# Patient Record
Sex: Female | Born: 1940 | Race: White | Hispanic: No | Smoking: Never smoker
Health system: Southern US, Community
[De-identification: ages and names within clinical notes are randomized; demographics above are authoritative.]

## PROBLEM LIST (undated history)

## (undated) DIAGNOSIS — R42 Dizziness and giddiness: Secondary | ICD-10-CM

## (undated) DIAGNOSIS — M8580 Other specified disorders of bone density and structure, unspecified site: Secondary | ICD-10-CM

## (undated) DIAGNOSIS — J189 Pneumonia, unspecified organism: Secondary | ICD-10-CM

## (undated) DIAGNOSIS — E785 Hyperlipidemia, unspecified: Secondary | ICD-10-CM

## (undated) DIAGNOSIS — E039 Hypothyroidism, unspecified: Secondary | ICD-10-CM

## (undated) DIAGNOSIS — I10 Essential (primary) hypertension: Secondary | ICD-10-CM

## (undated) DIAGNOSIS — R7303 Prediabetes: Secondary | ICD-10-CM

## (undated) DIAGNOSIS — E0590 Thyrotoxicosis, unspecified without thyrotoxic crisis or storm: Secondary | ICD-10-CM

## (undated) DIAGNOSIS — Z923 Personal history of irradiation: Secondary | ICD-10-CM

## (undated) DIAGNOSIS — E119 Type 2 diabetes mellitus without complications: Secondary | ICD-10-CM

## (undated) DIAGNOSIS — K635 Polyp of colon: Secondary | ICD-10-CM

## (undated) DIAGNOSIS — C50911 Malignant neoplasm of unspecified site of right female breast: Secondary | ICD-10-CM

## (undated) DIAGNOSIS — G4700 Insomnia, unspecified: Secondary | ICD-10-CM

## (undated) HISTORY — PX: MASTECTOMY: SHX3

## (undated) HISTORY — PX: APPENDECTOMY: SHX54

## (undated) HISTORY — PX: OOPHORECTOMY: SHX86

## (undated) HISTORY — PX: EYE SURGERY: SHX253

## (undated) HISTORY — PX: CHOLECYSTECTOMY: SHX55

## (undated) HISTORY — PX: CATARACT EXTRACTION W/ INTRAOCULAR LENS IMPLANT: SHX1309

## (undated) HISTORY — PX: THYROID SURGERY: SHX805

## (undated) HISTORY — PX: TOTAL THYROIDECTOMY: SHX2547

## (undated) HISTORY — PX: TONSILLECTOMY: SUR1361

## (undated) HISTORY — PX: BREAST BIOPSY: SHX20

## (undated) HISTORY — PX: ABDOMINAL HYSTERECTOMY: SHX81

## (undated) HISTORY — PX: COLONOSCOPY: SHX174

---

## 2004-07-27 ENCOUNTER — Ambulatory Visit: Payer: Self-pay | Admitting: General Surgery

## 2005-08-01 ENCOUNTER — Ambulatory Visit: Payer: Self-pay | Admitting: General Surgery

## 2006-02-13 ENCOUNTER — Ambulatory Visit: Payer: Self-pay | Admitting: Internal Medicine

## 2006-03-13 ENCOUNTER — Ambulatory Visit: Payer: Self-pay | Admitting: Unknown Physician Specialty

## 2006-08-13 ENCOUNTER — Ambulatory Visit: Payer: Self-pay | Admitting: General Surgery

## 2006-09-12 ENCOUNTER — Ambulatory Visit: Payer: Self-pay | Admitting: Unknown Physician Specialty

## 2007-08-19 ENCOUNTER — Ambulatory Visit: Payer: Self-pay | Admitting: Internal Medicine

## 2007-09-16 ENCOUNTER — Ambulatory Visit: Payer: Self-pay | Admitting: Unknown Physician Specialty

## 2008-03-09 ENCOUNTER — Ambulatory Visit: Payer: Self-pay | Admitting: Unknown Physician Specialty

## 2008-08-24 ENCOUNTER — Ambulatory Visit: Payer: Self-pay | Admitting: Internal Medicine

## 2008-09-28 ENCOUNTER — Ambulatory Visit: Payer: Self-pay | Admitting: Unknown Physician Specialty

## 2009-03-29 ENCOUNTER — Ambulatory Visit: Payer: Self-pay | Admitting: Unknown Physician Specialty

## 2009-08-25 ENCOUNTER — Ambulatory Visit: Payer: Self-pay | Admitting: Internal Medicine

## 2009-09-13 ENCOUNTER — Ambulatory Visit: Payer: Self-pay | Admitting: Unknown Physician Specialty

## 2009-09-22 ENCOUNTER — Ambulatory Visit: Payer: Self-pay | Admitting: Unknown Physician Specialty

## 2009-09-28 ENCOUNTER — Ambulatory Visit: Payer: Self-pay | Admitting: Cardiovascular Disease

## 2009-10-11 ENCOUNTER — Ambulatory Visit: Payer: Self-pay | Admitting: Unknown Physician Specialty

## 2010-09-19 ENCOUNTER — Ambulatory Visit: Payer: Self-pay | Admitting: Internal Medicine

## 2011-09-20 ENCOUNTER — Ambulatory Visit: Payer: Self-pay | Admitting: Family Medicine

## 2011-11-29 ENCOUNTER — Ambulatory Visit: Payer: Self-pay | Admitting: Family Medicine

## 2011-12-04 ENCOUNTER — Ambulatory Visit: Payer: Self-pay | Admitting: Family Medicine

## 2011-12-04 LAB — CREATININE, SERUM
Creatinine: 0.8 mg/dL (ref 0.60–1.30)
EGFR (African American): >60
EGFR (Non-African Amer.): >60

## 2012-09-30 ENCOUNTER — Ambulatory Visit: Payer: Self-pay | Admitting: Family Medicine

## 2012-12-17 ENCOUNTER — Ambulatory Visit: Payer: Self-pay | Admitting: Gastroenterology

## 2013-10-13 ENCOUNTER — Ambulatory Visit: Payer: Self-pay | Admitting: Family Medicine

## 2014-11-04 ENCOUNTER — Other Ambulatory Visit: Payer: Self-pay | Admitting: Family Medicine

## 2014-11-04 DIAGNOSIS — Z1231 Encounter for screening mammogram for malignant neoplasm of breast: Secondary | ICD-10-CM

## 2014-11-09 ENCOUNTER — Other Ambulatory Visit: Payer: Self-pay | Admitting: Family Medicine

## 2014-11-09 ENCOUNTER — Ambulatory Visit
Admission: RE | Admit: 2014-11-09 | Discharge: 2014-11-09 | Disposition: A | Discharge status: Home / Self Care | Payer: Medicare Other | Source: Ambulatory Visit | Attending: Family Medicine | Admitting: Family Medicine

## 2014-11-09 DIAGNOSIS — Z1231 Encounter for screening mammogram for malignant neoplasm of breast: Secondary | ICD-10-CM

## 2014-11-11 ENCOUNTER — Ambulatory Visit: Payer: Self-pay

## 2015-09-30 ENCOUNTER — Other Ambulatory Visit: Payer: Self-pay | Admitting: Family Medicine

## 2015-09-30 DIAGNOSIS — Z1231 Encounter for screening mammogram for malignant neoplasm of breast: Secondary | ICD-10-CM

## 2015-11-14 ENCOUNTER — Ambulatory Visit
Admission: RE | Admit: 2015-11-14 | Discharge: 2015-11-14 | Disposition: A | Discharge status: Home / Self Care | Payer: Medicare Other | Source: Ambulatory Visit | Attending: Family Medicine | Admitting: Family Medicine

## 2015-11-14 ENCOUNTER — Other Ambulatory Visit: Payer: Self-pay | Admitting: Family Medicine

## 2015-11-14 DIAGNOSIS — Z1231 Encounter for screening mammogram for malignant neoplasm of breast: Secondary | ICD-10-CM | POA: Insufficient documentation

## 2016-04-09 DIAGNOSIS — Z923 Personal history of irradiation: Secondary | ICD-10-CM

## 2016-04-09 HISTORY — DX: Personal history of irradiation: Z92.3

## 2016-10-03 ENCOUNTER — Other Ambulatory Visit: Payer: Self-pay | Admitting: Family Medicine

## 2016-10-03 DIAGNOSIS — Z1231 Encounter for screening mammogram for malignant neoplasm of breast: Secondary | ICD-10-CM

## 2016-11-07 DIAGNOSIS — C50911 Malignant neoplasm of unspecified site of right female breast: Secondary | ICD-10-CM

## 2016-11-07 HISTORY — PX: BREAST BIOPSY: SHX20

## 2016-11-07 HISTORY — DX: Malignant neoplasm of unspecified site of right female breast: C50.911

## 2016-11-20 ENCOUNTER — Ambulatory Visit
Admission: RE | Admit: 2016-11-20 | Discharge: 2016-11-20 | Disposition: A | Discharge status: Home / Self Care | Payer: Medicare Other | Source: Ambulatory Visit | Attending: Family Medicine | Admitting: Family Medicine

## 2016-11-20 DIAGNOSIS — R928 Other abnormal and inconclusive findings on diagnostic imaging of breast: Secondary | ICD-10-CM | POA: Diagnosis not present

## 2016-11-20 DIAGNOSIS — Z1231 Encounter for screening mammogram for malignant neoplasm of breast: Secondary | ICD-10-CM | POA: Diagnosis present

## 2016-11-21 ENCOUNTER — Other Ambulatory Visit: Payer: Self-pay | Admitting: Family Medicine

## 2016-11-21 DIAGNOSIS — R928 Other abnormal and inconclusive findings on diagnostic imaging of breast: Secondary | ICD-10-CM

## 2016-11-21 DIAGNOSIS — N6310 Unspecified lump in the right breast, unspecified quadrant: Secondary | ICD-10-CM

## 2016-11-27 ENCOUNTER — Ambulatory Visit
Admission: RE | Admit: 2016-11-27 | Discharge: 2016-11-27 | Disposition: A | Discharge status: Home / Self Care | Payer: Medicare Other | Source: Ambulatory Visit | Attending: Family Medicine | Admitting: Family Medicine

## 2016-11-27 DIAGNOSIS — R921 Mammographic calcification found on diagnostic imaging of breast: Secondary | ICD-10-CM | POA: Insufficient documentation

## 2016-11-27 DIAGNOSIS — R928 Other abnormal and inconclusive findings on diagnostic imaging of breast: Secondary | ICD-10-CM

## 2016-11-27 DIAGNOSIS — N6310 Unspecified lump in the right breast, unspecified quadrant: Secondary | ICD-10-CM

## 2016-11-27 DIAGNOSIS — N6312 Unspecified lump in the right breast, upper inner quadrant: Secondary | ICD-10-CM | POA: Diagnosis not present

## 2016-11-29 ENCOUNTER — Other Ambulatory Visit: Payer: Self-pay | Admitting: Family Medicine

## 2016-11-29 DIAGNOSIS — R928 Other abnormal and inconclusive findings on diagnostic imaging of breast: Secondary | ICD-10-CM

## 2016-11-29 DIAGNOSIS — N6310 Unspecified lump in the right breast, unspecified quadrant: Secondary | ICD-10-CM

## 2016-11-29 DIAGNOSIS — R921 Mammographic calcification found on diagnostic imaging of breast: Secondary | ICD-10-CM

## 2016-12-04 ENCOUNTER — Ambulatory Visit
Admission: RE | Admit: 2016-12-04 | Discharge: 2016-12-04 | Disposition: A | Discharge status: Home / Self Care | Payer: Medicare Other | Source: Ambulatory Visit | Attending: Family Medicine | Admitting: Family Medicine

## 2016-12-04 ENCOUNTER — Other Ambulatory Visit: Payer: Medicare Other

## 2016-12-04 DIAGNOSIS — N6310 Unspecified lump in the right breast, unspecified quadrant: Secondary | ICD-10-CM

## 2016-12-04 DIAGNOSIS — R928 Other abnormal and inconclusive findings on diagnostic imaging of breast: Secondary | ICD-10-CM

## 2016-12-04 DIAGNOSIS — R921 Mammographic calcification found on diagnostic imaging of breast: Secondary | ICD-10-CM | POA: Insufficient documentation

## 2016-12-05 ENCOUNTER — Other Ambulatory Visit: Payer: Self-pay | Admitting: Pathology

## 2016-12-08 HISTORY — PX: BREAST LUMPECTOMY: SHX2

## 2016-12-12 ENCOUNTER — Other Ambulatory Visit: Payer: Self-pay | Admitting: Surgery

## 2016-12-12 DIAGNOSIS — C50211 Malignant neoplasm of upper-inner quadrant of right female breast: Secondary | ICD-10-CM

## 2016-12-13 ENCOUNTER — Other Ambulatory Visit: Payer: Self-pay

## 2016-12-14 ENCOUNTER — Other Ambulatory Visit: Payer: Self-pay | Admitting: Surgery

## 2016-12-14 ENCOUNTER — Encounter: Payer: Self-pay | Admitting: *Deleted

## 2016-12-14 DIAGNOSIS — C50211 Malignant neoplasm of upper-inner quadrant of right female breast: Secondary | ICD-10-CM

## 2016-12-14 NOTE — Progress Notes (Signed)
  Oncology Nurse Navigator Documentation  Navigator Location: [facility] (12/14/16 1500) Referral date to RadOnc/MedOnc: 12/21/16 (12/14/16 1500) )Navigator Encounter Type: Introductory phone call (12/14/16 1500)   Abnormal Finding Date: 11/27/16 (12/14/16 1500) Confirmed Diagnosis Date: 12/07/16 (12/14/16 1500) Surgery Date: 01/04/17 (12/14/16 1500)                 Barriers/Navigation Needs: Education (12/14/16 1500) Education: Newly Diagnosed Cancer Education (12/14/16 1500)                        Time Spent with Patient: 66 (12/14/16 1500)   Called and talked with patient today.  She had her initial surgical consult yesterday with Dr. Tamala Julian.  She picked up our educational literature at the that visit.  She is scheduled for medical oncology consult with Dr. Grayland Ormond on 9/14 and surgery on 9/28.  She is to call if she has any questions or needs.

## 2016-12-21 ENCOUNTER — Ambulatory Visit: Payer: Medicare Other | Admitting: Oncology

## 2016-12-25 ENCOUNTER — Encounter
Admission: RE | Admit: 2016-12-25 | Discharge: 2016-12-25 | Disposition: A | Discharge status: Home / Self Care | Payer: Medicare Other | Source: Ambulatory Visit | Attending: Surgery | Admitting: Surgery

## 2016-12-25 DIAGNOSIS — C50211 Malignant neoplasm of upper-inner quadrant of right female breast: Secondary | ICD-10-CM | POA: Insufficient documentation

## 2016-12-25 DIAGNOSIS — I10 Essential (primary) hypertension: Secondary | ICD-10-CM | POA: Insufficient documentation

## 2016-12-25 DIAGNOSIS — Z01818 Encounter for other preprocedural examination: Secondary | ICD-10-CM | POA: Insufficient documentation

## 2016-12-25 HISTORY — DX: Prediabetes: R73.03

## 2016-12-25 HISTORY — DX: Hypothyroidism, unspecified: E03.9

## 2016-12-25 HISTORY — DX: Insomnia, unspecified: G47.00

## 2016-12-25 HISTORY — DX: Other specified disorders of bone density and structure, unspecified site: M85.80

## 2016-12-25 HISTORY — DX: Essential (primary) hypertension: I10

## 2016-12-25 NOTE — Progress Notes (Deleted)
[place]  Telephone:([phone]) [phone] Fax:(336) [phone]  ID: Meghan Dougherty OB: [birth year]  MR#: [id number]  JKK#:[id number]  Patient Care Team: Dion Body, MD as PCP - General (Family Medicine)  CHIEF COMPLAINT: ***  INTERVAL HISTORY: ***  REVIEW OF SYSTEMS:   ROS  As per HPI. Otherwise, a complete review of systems is negative.  PAST MEDICAL HISTORY: Past Medical History:  Diagnosis Date  . Borderline diabetes   . Hyperlipidemia   . Hypertension   . Hypothyroidism   . Insomnia   . Osteopenia     PAST SURGICAL HISTORY: Past Surgical History:  Procedure Laterality Date  . ABDOMINAL HYSTERECTOMY     oophorectomy  . BREAST BIOPSY Left    neg core  . BREAST BIOPSY Right    neg  core  . COLONOSCOPY    . EYE SURGERY     bilateral cataracts  . TONSILLECTOMY    . TOTAL THYROIDECTOMY      FAMILY HISTORY: Family History  Problem Relation Age of Onset  . Breast cancer Sister 37  . Breast cancer Maternal Aunt     ADVANCED DIRECTIVES (Y/N):  N  HEALTH MAINTENANCE: Social History  Substance Use Topics  . Smoking status: Never Smoker  . Smokeless tobacco: Never Used  . Alcohol use Yes     Comment: occassional     Colonoscopy:  PAP:  Bone density:  Lipid panel:  Allergies  Allergen Reactions  . Trazodone Other (See Comments)    Headache and nightmares  . Penicillins Rash    Has patient had a PCN reaction causing immediate rash, facial/tongue/throat swelling, SOB or lightheadedness with hypotension: No Has patient had a PCN reaction causing severe rash involving mucus membranes or skin necrosis: No Has patient had a PCN reaction that required hospitalization: No Has patient had a PCN reaction occurring within the last 10 years: No If all of the above answers are "NO", then may proceed with Cephalosporin use.     Current Outpatient Prescriptions  Medication Sig Dispense Refill  . acetaminophen (TYLENOL) 500 MG tablet  Take 250-500 mg by mouth every 6 (six) hours as needed for mild pain.    Marland Kitchen amLODipine (NORVASC) 5 MG tablet Take 5 mg by mouth every evening.    Marland Kitchen aspirin EC 81 MG tablet Take 81 mg by mouth daily.    . calcium-vitamin D (OSCAL WITH D) 500-200 MG-UNIT tablet Take 2 tablets by mouth daily.    . fluticasone (FLONASE) 50 MCG/ACT nasal spray Place 1 spray into both nostrils daily as needed for allergies or rhinitis.    . hydrochlorothiazide (HYDRODIURIL) 25 MG tablet Take 25 mg by mouth daily.    Marland Kitchen levothyroxine (SYNTHROID, LEVOTHROID) 50 MCG tablet Take 50 mcg by mouth daily before breakfast.    . lisinopril (PRINIVIL,ZESTRIL) 40 MG tablet Take 40 mg by mouth every evening.     . metoprolol tartrate (LOPRESSOR) 100 MG tablet Take 100 mg by mouth 2 (two) times daily.    . Multiple Vitamin (MULTIVITAMIN WITH MINERALS) TABS tablet Take 2 tablets by mouth daily.    . Omega-3 Fatty Acids (FISH OIL PO) Take 1 capsule by mouth daily.    Marland Kitchen omeprazole (PRILOSEC) 20 MG capsule Take 20 mg by mouth daily as needed (heartburn).    Marland Kitchen PARoxetine (PAXIL) 10 MG tablet Take 10 mg by mouth at bedtime.    . pravastatin (PRAVACHOL) 10 MG tablet Take 10 mg by mouth every evening.    Marland Kitchen  Probiotic Product (PROBIOTIC DAILY PO) Take 1 capsule by mouth daily.    Marland Kitchen zolpidem (AMBIEN) 5 MG tablet Take 2.5 mg by mouth at bedtime as needed for sleep.     No current facility-administered medications for this visit.     OBJECTIVE: There were no vitals filed for this visit.   There is no height or weight on file to calculate BMI.    ECOG FS:{CHL ONC [id number]  General: Well-developed, well-nourished, no acute distress. Eyes: Pink conjunctiva, anicteric sclera. HEENT: Normocephalic, moist mucous membranes, clear oropharnyx. Lungs: Clear to auscultation bilaterally. Heart: Regular rate and rhythm. No rubs, murmurs, or gallops. Abdomen: Soft, nontender, nondistended. No organomegaly noted, normoactive bowel  sounds. Musculoskeletal: No edema, cyanosis, or clubbing. Neuro: Alert, answering all questions appropriately. Cranial nerves grossly intact. Skin: No rashes or petechiae noted. Psych: Normal affect. Lymphatics: No cervical, calvicular, axillary or inguinal LAD.   LAB RESULTS:  Lab Results  Component Value Date   CREATININE 0.80 12/04/2011   GFRNONAA >60 12/04/2011   GFRAA >60 12/04/2011    No results found for: WBC, NEUTROABS, HGB, HCT, MCV, PLT   STUDIES: US Breast Ltd Uni Right Inc Axilla  Result Date: 11/27/2016 CLINICAL DATA:  Screening recall for a right breast mass.The patient has family history of breast cancer in her sister at age 60 and in a maternal aunt EXAM: 2D DIGITAL DIAGNOSTIC UNILATERAL RIGHT MAMMOGRAM WITH CAD AND ADJUNCT TOMO RIGHT BREAST ULTRASOUND COMPARISON:  Previous exam(s). ACR Breast Density Category c: The breast tissue is heterogeneously dense, which may obscure small masses. FINDINGS: In the upper slightly inner quadrant of the right breast there is an oval mass with an angular as well is indistinct margins measuring approximately 4 mm. This is in the far posterior depth of the breast. Additionally, in the lower-inner quadrant of the right breast there is a 1.8 cm group of amorphous calcifications in the posterior depth. On the spot compression tomosynthesis images in the CC view, these calcifications appear to be in a linear branching configuration. Mammographic images were processed with CAD. Ultrasound targeted to the right breast at 1230, 10 cm from the nipple demonstrates an irregular hypoechoic mass measuring 6 x 4 x 5 mm. Ultrasound of the right axilla demonstrates multiple normal-appearing lymph nodes. IMPRESSION: 1.  There is a suspicious mass in the right breast at 12:30. 2. There is an indeterminate 1.8 cm group of amorphous calcifications in the lower-inner quadrant of the right breast. 3.  No evidence of right axillary lymphadenopathy. RECOMMENDATION:  1. Ultrasound-guided biopsy is recommended for the small right breast mass at 1230. 2. Stereotactic biopsy is recommended for the calcifications in the lower-inner quadrant of the right breast. I have discussed the findings and recommendations with the patient. Results were also provided in writing at the conclusion of the visit. If applicable, a reminder letter will be sent to the patient regarding the next appointment. BI-RADS CATEGORY  4: Suspicious. Electronically Signed   By: Ammie Ferrier M.D.   On: 11/27/2016 12:00   Mm Diag Breast Tomo Uni Right  Result Date: 11/27/2016 CLINICAL DATA:  Screening recall for a right breast mass.The patient has family history of breast cancer in her sister at age 82 and in a maternal aunt EXAM: 2D DIGITAL DIAGNOSTIC UNILATERAL RIGHT MAMMOGRAM WITH CAD AND ADJUNCT TOMO RIGHT BREAST ULTRASOUND COMPARISON:  Previous exam(s). ACR Breast Density Category c: The breast tissue is heterogeneously dense, which may obscure small masses. FINDINGS: In the upper slightly inner quadrant  of the right breast there is an oval mass with an angular as well is indistinct margins measuring approximately 4 mm. This is in the far posterior depth of the breast. Additionally, in the lower-inner quadrant of the right breast there is a 1.8 cm group of amorphous calcifications in the posterior depth. On the spot compression tomosynthesis images in the CC view, these calcifications appear to be in a linear branching configuration. Mammographic images were processed with CAD. Ultrasound targeted to the right breast at 1230, 10 cm from the nipple demonstrates an irregular hypoechoic mass measuring 6 x 4 x 5 mm. Ultrasound of the right axilla demonstrates multiple normal-appearing lymph nodes. IMPRESSION: 1.  There is a suspicious mass in the right breast at 12:30. 2. There is an indeterminate 1.8 cm group of amorphous calcifications in the lower-inner quadrant of the right breast. 3.  No evidence of  right axillary lymphadenopathy. RECOMMENDATION: 1. Ultrasound-guided biopsy is recommended for the small right breast mass at 1230. 2. Stereotactic biopsy is recommended for the calcifications in the lower-inner quadrant of the right breast. I have discussed the findings and recommendations with the patient. Results were also provided in writing at the conclusion of the visit. If applicable, a reminder letter will be sent to the patient regarding the next appointment. BI-RADS CATEGORY  4: Suspicious. Electronically Signed   By: Ammie Ferrier M.D.   On: 11/27/2016 12:00   Mm Clip Placement Right  Result Date: 12/04/2016 CLINICAL DATA:  Status post ultrasound-guided biopsy of a right breast mass at the 12:30 o'clock axis and stereotactic biopsy of indeterminate calcifications within the lower inner quadrant of the right breast. EXAM: DIAGNOSTIC RIGHT MAMMOGRAM POST ULTRASOUND AND STEREOTACTIC BIOPSY COMPARISON:  Previous exam(s). FINDINGS: Mammographic images were obtained following ultrasound guided biopsy of a right breast mass at the 12:30 o'clock axis followed by a stereotactic guided biopsy of indeterminate calcifications in the lower inner quadrant of the right breast. Ribbon shaped clip appears appropriately position within the upper right breast corresponding to the targeted mass. Coil shaped biopsy clip is well-positioned within the lower inner quadrant of the right breast corresponding to the targeted calcifications. IMPRESSION: 1. Ribbon shaped biopsy clip appropriately position within the upper right breast at the site of the targeted mass at the 12:30 o'clock axis. 2. Coil shaped biopsy clip well-positioned within the lower inner quadrant of the right breast corresponding to the targeted calcifications. Final Assessment: Post Procedure Mammograms for Marker Placement Electronically Signed   By: Franki Cabot M.D.   On: 12/04/2016 10:02   Mm Rt Breast Bx W Loc Dev 1st Lesion Image Bx Spec Stereo  Guide  Addendum Date: 12/07/2016   ADDENDUM REPORT: 12/06/2016 17:01 ADDENDUM: Pathology of the stereotactic guided right breast biopsy revealed B. RIGHT BREAST, LOWER INNER QUADRANT; STEREOTACTIC CORE BIOPSY: FIBROADENOMATOUS CHANGE WITH MICROCALCIFICATIONS. This was found to be concordant by Dr. Enriqueta Shutter. Recommendation: Surgical and oncology consultation for additional area of the right breast biopsied with ultrasound guidance same day. Results and recommendations were relayed to Dr. Raylene Miyamoto nurse Wells Guiles, RN on 12/05/16. She stated that Dr. Netty Starring is out of the office but one of the other providers will contact the patient with the results. They will make the referrals. The patient was contacted by Jetta Lout, [facility]) on 12/05/16 for a post biopsy check. The patient stated she has had no bleeding or bruising. She did state she had itching and burning under the clear bandage. She was instructed to remove the  bandage and try to leave the steri-strips in place if possible. Post biopsy instructions were reviewed with the patient. All of her questions were answered. She was encouraged to contact Jetta Lout, [place] with any further questions or concerns regarding the biopsy site. Addendum by Jetta Lout, RRA on 12/06/16. Electronically Signed   By: Franki Cabot M.D.   On: 12/06/2016 17:01   Result Date: 12/07/2016 CLINICAL DATA:  Patient with indeterminate calcifications within the lower inner quadrant of the right breast presents today for stereotactic biopsy. EXAM: RIGHT BREAST STEREOTACTIC CORE NEEDLE BIOPSY COMPARISON:  Previous exams. FINDINGS: The patient and I discussed the procedure of stereotactic-guided biopsy including benefits and alternatives. We discussed the high likelihood of a successful procedure. We discussed the risks of the procedure including infection, bleeding, tissue injury, clip migration, and inadequate sampling. Informed written consent was given. The usual  time out protocol was performed immediately prior to the procedure. Using sterile technique and 1% Lidocaine as local anesthetic, under stereotactic guidance, a 9 gauge vacuum assisted device was used to perform core needle biopsy of calcifications in the lower inner quadrant of the right breast using a medial approach. Specimen radiograph was performed showing calcifications. Specimens with calcifications are identified for pathology. Lesion quadrant: Lower inner quadrant At the conclusion of the procedure, a coil shaped tissue marker clip was deployed into the biopsy cavity. Follow-up 2-view mammogram was performed and dictated separately. IMPRESSION: Stereotactic-guided biopsy of calcifications within the lower inner quadrant of the right breast. No apparent complications. Electronically Signed: By: Franki Cabot M.D. On: 12/04/2016 09:50   [place] Rt Breast Bx W Loc Dev 1st Lesion Img Bx Spec US Guide  Addendum Date: 12/07/2016   ADDENDUM REPORT: 12/06/2016 16:51 ADDENDUM: Pathology of the ultrasound guided right breast biopsy revealed A. RIGHT BREAST, 12:30; ULTRASOUND-GUIDED CORE BIOPSY: INVASIVE MAMMARY CARCINOMA, NO SPECIAL TYPE. Size of invasive carcinoma: 0.6 cm in this sample Histologic grade of invasive carcinoma: Grade 3. ER/PR/HER2: Immunohistochemistry will be performed with reflex to [place] for equivocal HER2-neu (2+) results. The results will be reported in an addendum. This was found to be concordant by Dr. Enriqueta Shutter. Recommendation:  Surgical and oncology referrals. Results were relayed to Dr. Raylene Miyamoto nurse Wells Guiles, RN on 12/05/16. She stated that Dr. Netty Starring is out of the office and one of the other providers will contact the patient with the results. They will arrange the referrals. The patient was contacted by Jetta Lout, [place] by phone on 12/05/16 for a post biopsy site check. The patient stated she had no bleeding or bruising. She did describe a sensitivity to the bandage with itching and  burning under the bandage. She was instructed to remove the clear bandage and leave the steri-strips in place if possible. Post biopsy instructions were reviewed with the patient. All of her questions were answered. She was encouraged to contact Jetta Lout, [place] with any further questions or concerns regarding the biopsy site. Addendum by Jetta Lout, RRA on 12/06/16. Electronically Signed   By: Franki Cabot M.D.   On: 12/06/2016 16:51   Result Date: 12/07/2016 CLINICAL DATA:  Patient with a suspicious right breast mass at the 12:30 o'clock axis presents today for ultrasound-guided biopsy. EXAM: ULTRASOUND GUIDED RIGHT BREAST CORE NEEDLE BIOPSY COMPARISON:  Previous exam(s). PROCEDURE: I met with the patient and we discussed the procedure of ultrasound-guided biopsy, including benefits and alternatives. We discussed the high likelihood of a successful procedure. We discussed the risks of the procedure including infection, bleeding, tissue injury,  clip migration, and inadequate sampling. Informed written consent was given. The usual time-out protocol was performed immediately prior to the procedure. Lesion quadrant: Upper inner quadrant Using sterile technique and 1% Lidocaine as local anesthetic, under direct ultrasound visualization, a 12 gauge spring-loaded device was used to perform biopsy of the right breast mass at the 12:30 o'clock axis, 10 cm from the nipple,using a medial approach. At the conclusion of the procedure, a ribbon shaped tissue marker clip was deployed into the biopsy cavity. Follow-up 2-view mammogram was performed and dictated separately. IMPRESSION: Ultrasound-guided biopsy of the right breast mass at the 12:30 o'clock axis. No apparent complications. Electronically Signed: By: Franki Cabot M.D. On: 12/04/2016 09:04    ASSESSMENT: Clinical stage upper inner quadrant of the the right breast.  PLAN:    Patient expressed understanding and was in agreement with this plan. She also  understands that She can call clinic at any time with any questions, concerns, or complaints.   Cancer Staging Primary cancer of upper inner quadrant of right female breast [facility]) Staging form: Breast, AJCC 8th Edition - Clinical: cT1b, cN0, cM0, G3 - Signed by Lloyd Huger, MD on 12/25/2016   Lloyd Huger, MD   12/25/2016 10:29 PM

## 2016-12-25 NOTE — Patient Instructions (Signed)
  Your procedure is scheduled on: Friday 01/04/18 Report to Mammography. Nuclear medicine 8:45 .  Remember: Instructions that are not followed completely may result in serious medical risk, up to and including death, or upon the discretion of your surgeon and anesthesiologist your surgery may need to be rescheduled.    _x___ 1. Do not eat food after midnight the night before your procedure. No gum chewing or hard candies. You may drink clear liquids up to 2 hours before you are scheduled to arrive for your surgery- DO not drink clear liquids within 2 hours of the start of your surgery.  Clear Liquids include: water, apple juice without pulp, clear carbohydrate drink such as Clearfast of Gartorade, Black Coffee or Tea (Do not add anything to coffee or tea).    __x__ 2. No Alcohol for 24 hours before or after surgery.   ____ 3. Do Not Smoke For 24 Hours Prior to Your Surgery.   ____ 4. Bring all medications with you on the day of surgery if instructed.    __x__ 5. Notify your doctor if there is any change in your medical condition     (cold, fever, infections).       Do not wear jewelry, make-up, hairpins, clips or nail polish.  Do not wear lotions, powders, or perfumes. You may wear deodorant.  Do not shave 48 hours prior to surgery. Men may shave face and neck.  Do not bring valuables to the hospital.    [facility] is not responsible for any belongings or valuables.               Contacts, dentures or bridgework may not be worn into surgery.  Leave your suitcase in the car. After surgery it may be brought to your room.  For patients admitted to the hospital, discharge time is determined by your                treatment team.   Patients discharged the day of surgery will not be allowed to drive home.   Please read over the following fact sheets that you were given:      _x___ Take these medicines the morning of surgery with A SIP OF WATER:    1. acetaminophen  (TYLENOL) 500 MG tablet  2. levothyroxine (SYNTHROID, LEVOTHROID) 50 MCG tablet  3. metoprolol tartrate (LOPRESSOR) 100 MG tablet  4.omeprazole (PRILOSEC) 20 MG capsule the night before the surgery and the morning of surgery  5.  6.  ____ Fleet Enema (as directed)   _x___ Use CHG Soap or regular soap the night before surgery and morning of surgery  ____ Use inhalers on the day of surgery  ____ Stop metformin 2 days prior to surgery    ____ Take 1/2 of usual insulin dose the night before surgery and none on the morning of surgery.   __x__ Stop aspirin on 12/26/16 as directed by Dr. Thompson Caul office  _x___ Stop Anti-inflammatories  ( Aleve Ibuprofen ) on 9/21 Tylenol OK   __x__ Stop supplements until after surgery.  On 12/26/16 as directed by Dr. Thompson Caul office  ____ Bring C-Pap to the hospital.

## 2016-12-26 ENCOUNTER — Encounter
Admission: RE | Admit: 2016-12-26 | Discharge: 2016-12-26 | Disposition: A | Discharge status: Home / Self Care | Payer: Medicare Other | Source: Ambulatory Visit | Attending: Surgery | Admitting: Surgery

## 2016-12-26 DIAGNOSIS — I10 Essential (primary) hypertension: Secondary | ICD-10-CM | POA: Diagnosis not present

## 2016-12-26 DIAGNOSIS — Z01818 Encounter for other preprocedural examination: Secondary | ICD-10-CM | POA: Diagnosis not present

## 2016-12-28 ENCOUNTER — Inpatient Hospital Stay: Payer: Medicare Other | Attending: Oncology | Admitting: Oncology

## 2016-12-28 ENCOUNTER — Ambulatory Visit: Payer: Medicare Other | Admitting: Oncology

## 2016-12-28 ENCOUNTER — Encounter: Payer: Self-pay | Admitting: Oncology

## 2016-12-28 VITALS — BP 173/78 | HR 58 | Temp 98.6°F | Resp 18 | Ht 59.5 in | Wt 158.0 lb

## 2016-12-28 DIAGNOSIS — C50211 Malignant neoplasm of upper-inner quadrant of right female breast: Secondary | ICD-10-CM | POA: Insufficient documentation

## 2016-12-28 DIAGNOSIS — I10 Essential (primary) hypertension: Secondary | ICD-10-CM | POA: Insufficient documentation

## 2016-12-28 DIAGNOSIS — R7303 Prediabetes: Secondary | ICD-10-CM | POA: Insufficient documentation

## 2016-12-28 DIAGNOSIS — E039 Hypothyroidism, unspecified: Secondary | ICD-10-CM | POA: Insufficient documentation

## 2016-12-28 DIAGNOSIS — M8580 Other specified disorders of bone density and structure, unspecified site: Secondary | ICD-10-CM | POA: Diagnosis not present

## 2016-12-28 DIAGNOSIS — Z7982 Long term (current) use of aspirin: Secondary | ICD-10-CM | POA: Insufficient documentation

## 2016-12-28 DIAGNOSIS — E785 Hyperlipidemia, unspecified: Secondary | ICD-10-CM | POA: Insufficient documentation

## 2016-12-28 DIAGNOSIS — Z171 Estrogen receptor negative status [ER-]: Secondary | ICD-10-CM | POA: Diagnosis not present

## 2016-12-28 DIAGNOSIS — G4700 Insomnia, unspecified: Secondary | ICD-10-CM | POA: Insufficient documentation

## 2016-12-28 DIAGNOSIS — Z79899 Other long term (current) drug therapy: Secondary | ICD-10-CM | POA: Diagnosis not present

## 2016-12-28 NOTE — Progress Notes (Signed)
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  INTERVAL HISTORY: Patient is 76 year old female who was noted to have an abnormality on routine screening mammogram. Subsequent ultrasound and biopsy revealed the above stated malignancy. She is anxious, but otherwise feels well. She has no neurologic complaints. She denies any recent fevers or illnesses. She has a good appetite and denies weight loss. She denies any pain. She has no chest pain or shortness of breath. She denies any nausea, vomiting, constipation, or diarrhea. She has no urinary complaints. Patient otherwise feels well and offers no further specific complaints today.  REVIEW OF SYSTEMS:   Review of Systems  Constitutional: Negative.  Negative for fever, malaise/fatigue and weight loss.  Eyes: Negative.   Respiratory: Negative.  Negative for cough and shortness of breath.   Cardiovascular: Negative.  Negative for palpitations and leg swelling.  Gastrointestinal: Negative.  Negative for abdominal pain.  Genitourinary: Negative.   Musculoskeletal: Negative.   Skin: Negative.  Negative for rash.  Neurological: Negative.  Negative for weakness.  Psychiatric/Behavioral: The patient is nervous/anxious.     As per HPI. Otherwise, a complete review of systems is negative.  PAST MEDICAL HISTORY: Past Medical History:  Diagnosis Date  . Borderline diabetes   . Hyperlipidemia   . Hypertension   . Hypothyroidism   . Insomnia   . Osteopenia     PAST SURGICAL HISTORY: Past Surgical History:  Procedure Laterality Date  . ABDOMINAL HYSTERECTOMY     oophorectomy  . APPENDECTOMY    . BREAST BIOPSY Left    neg core   . BREAST BIOPSY Right    neg  core  . CHOLECYSTECTOMY    . COLONOSCOPY    . EYE SURGERY     bilateral cataracts  . TONSILLECTOMY    . TOTAL THYROIDECTOMY      FAMILY HISTORY: Family History  Problem Relation Age of Onset  . Breast cancer Sister 37  . Lung cancer Sister   . Diabetes Sister   . Breast cancer Maternal Aunt   . Diabetes Mother   . Diabetes Father   . Diabetes Brother   . Diabetes Sister   . Diabetes Sister   . Diabetes Brother     ADVANCED DIRECTIVES (Y/N):  N  HEALTH MAINTENANCE: Social History  Substance Use Topics  . Smoking status: Never Smoker  . Smokeless tobacco: Never Used  . Alcohol use Yes     Comment: occassional     Colonoscopy:  PAP:  Bone density:  Lipid panel:  Allergies  Allergen Reactions  . Trazodone Other (See Comments)    Headache and nightmares  . Penicillins Rash    Has patient had a PCN reaction causing immediate rash, facial/tongue/throat swelling, SOB or lightheadedness with hypotension: No Has patient had a PCN reaction causing severe rash involving mucus membranes or skin necrosis: No Has patient had a PCN reaction that required hospitalization: No Has patient had a PCN reaction occurring within the last 10 years: No If all of the above answers are "NO", then may proceed with Cephalosporin use.     Current Outpatient Prescriptions  Medication Sig Dispense Refill  . acetaminophen (TYLENOL) 500 MG tablet Take 250-500 mg by mouth every 6 (six)  hours as needed for mild pain.    Marland Kitchen amLODipine (NORVASC) 5 MG tablet Take 5 mg by mouth every evening.    Marland Kitchen aspirin EC 81 MG tablet Take 81 mg by mouth daily.    . calcium-vitamin D (OSCAL WITH D) 500-200 MG-UNIT tablet Take 2 tablets by mouth daily.    . fluticasone (FLONASE) 50 MCG/ACT nasal spray Place 1 spray into both nostrils daily as needed for allergies or rhinitis.    . hydrochlorothiazide (HYDRODIURIL) 25 MG tablet Take 25 mg by mouth daily.    Marland Kitchen levothyroxine  (SYNTHROID, LEVOTHROID) 50 MCG tablet Take 50 mcg by mouth daily before breakfast.    . lisinopril (PRINIVIL,ZESTRIL) 40 MG tablet Take 40 mg by mouth every evening.     . loperamide (IMODIUM A-D) 2 MG tablet Take 2 mg by mouth 4 (four) times daily as needed for diarrhea or loose stools.    . metoprolol tartrate (LOPRESSOR) 100 MG tablet Take 100 mg by mouth 2 (two) times daily.    . Multiple Vitamin (MULTIVITAMIN WITH MINERALS) TABS tablet Take 2 tablets by mouth daily.    . Omega-3 Fatty Acids (FISH OIL PO) Take 1 capsule by mouth daily.    Marland Kitchen omeprazole (PRILOSEC) 20 MG capsule Take 20 mg by mouth daily as needed (heartburn).    Marland Kitchen PARoxetine (PAXIL) 10 MG tablet Take 10 mg by mouth at bedtime.    . pravastatin (PRAVACHOL) 10 MG tablet Take 10 mg by mouth every evening.    . Probiotic Product (PROBIOTIC DAILY PO) Take 1 capsule by mouth daily.    Marland Kitchen zolpidem (AMBIEN) 5 MG tablet Take 2.5 mg by mouth at bedtime as needed for sleep.     No current facility-administered medications for this visit.     OBJECTIVE: Vitals:   12/28/16 0955  BP: (!) 173/78  Pulse: (!) 58  Resp: 18  Temp: 98.6 F (37 C)     Body mass index is 31.37 kg/m.    ECOG FS:0 - Asymptomatic  General: Well-developed, well-nourished, no acute distress. Eyes: Pink conjunctiva, anicteric sclera. HEENT: Normocephalic, moist mucous membranes, clear oropharnyx. Breasts: Patient requested exam be deferred today. Lungs: Clear to auscultation bilaterally. Heart: Regular rate and rhythm. No rubs, murmurs, or gallops. Abdomen: Soft, nontender, nondistended. No organomegaly noted, normoactive bowel sounds. Musculoskeletal: No edema, cyanosis, or clubbing. Neuro: Alert, answering all questions appropriately. Cranial nerves grossly intact. Skin: No rashes or petechiae noted. Psych: Normal affect. Lymphatics: No cervical, calvicular, axillary or inguinal LAD.   LAB RESULTS:  Lab Results  Component Value Date   CREATININE  0.80 12/04/2011   GFRNONAA >60 12/04/2011   GFRAA >60 12/04/2011    No results found for: WBC, NEUTROABS, HGB, HCT, MCV, PLT   STUDIES: Mm Clip Placement Right  Result Date: 12/04/2016 CLINICAL DATA:  Status post ultrasound-guided biopsy of a right breast mass at the 12:30 o'clock axis and stereotactic biopsy of indeterminate calcifications within the lower inner quadrant of the right breast. EXAM: DIAGNOSTIC RIGHT MAMMOGRAM POST ULTRASOUND AND STEREOTACTIC BIOPSY COMPARISON:  Previous exam(s). FINDINGS: Mammographic images were obtained following ultrasound guided biopsy of a right breast mass at the 12:30 o'clock axis followed by a stereotactic guided biopsy of indeterminate calcifications in the lower inner quadrant of the right breast. Ribbon shaped clip appears appropriately position within the upper right breast corresponding to the targeted mass. Coil shaped biopsy clip is well-positioned within the lower inner quadrant of the right breast corresponding to the targeted calcifications.  IMPRESSION: 1. Ribbon shaped biopsy clip appropriately position within the upper right breast at the site of the targeted mass at the 12:30 o'clock axis. 2. Coil shaped biopsy clip well-positioned within the lower inner quadrant of the right breast corresponding to the targeted calcifications. Final Assessment: Post Procedure Mammograms for Marker Placement Electronically Signed   By: Franki Cabot M.D.   On: 12/04/2016 10:02   Mm Rt Breast Bx W Loc Dev 1st Lesion Image Bx Spec Stereo Guide  Addendum Date: 12/07/2016   ADDENDUM REPORT: 12/06/2016 17:01 ADDENDUM: Pathology of the stereotactic guided right breast biopsy revealed B. RIGHT BREAST, LOWER INNER QUADRANT; STEREOTACTIC CORE BIOPSY: FIBROADENOMATOUS CHANGE WITH MICROCALCIFICATIONS. This was found to be concordant by Dr. Enriqueta Shutter. Recommendation: Surgical and oncology consultation for additional area of the right breast biopsied with ultrasound guidance same  day. Results and recommendations were relayed to Dr. Raylene Miyamoto nurse Wells Guiles, RN on 12/05/16. She stated that Dr. Netty Starring is out of the office but one of the other providers will contact the patient with the results. They will make the referrals. The patient was contacted by Jetta Lout, [place] [facility]) on 12/05/16 for a post biopsy check. The patient stated she has had no bleeding or bruising. She did state she had itching and burning under the clear bandage. She was instructed to remove the bandage and try to leave the steri-strips in place if possible. Post biopsy instructions were reviewed with the patient. All of her questions were answered. She was encouraged to contact Jetta Lout, [place] with any further questions or concerns regarding the biopsy site. Addendum by Jetta Lout, RRA on 12/06/16. Electronically Signed   By: Franki Cabot M.D.   On: 12/06/2016 17:01   Result Date: 12/07/2016 CLINICAL DATA:  Patient with indeterminate calcifications within the lower inner quadrant of the right breast presents today for stereotactic biopsy. EXAM: RIGHT BREAST STEREOTACTIC CORE NEEDLE BIOPSY COMPARISON:  Previous exams. FINDINGS: The patient and I discussed the procedure of stereotactic-guided biopsy including benefits and alternatives. We discussed the high likelihood of a successful procedure. We discussed the risks of the procedure including infection, bleeding, tissue injury, clip migration, and inadequate sampling. Informed written consent was given. The usual time out protocol was performed immediately prior to the procedure. Using sterile technique and 1% Lidocaine as local anesthetic, under stereotactic guidance, a 9 gauge vacuum assisted device was used to perform core needle biopsy of calcifications in the lower inner quadrant of the right breast using a medial approach. Specimen radiograph was performed showing calcifications. Specimens with calcifications are identified for pathology.  Lesion quadrant: Lower inner quadrant At the conclusion of the procedure, a coil shaped tissue marker clip was deployed into the biopsy cavity. Follow-up 2-view mammogram was performed and dictated separately. IMPRESSION: Stereotactic-guided biopsy of calcifications within the lower inner quadrant of the right breast. No apparent complications. Electronically Signed: By: Franki Cabot M.D. On: 12/04/2016 09:50   [place] Rt Breast Bx W Loc Dev 1st Lesion Img Bx Spec US Guide  Addendum Date: 12/07/2016   ADDENDUM REPORT: 12/06/2016 16:51 ADDENDUM: Pathology of the ultrasound guided right breast biopsy revealed A. RIGHT BREAST, 12:30; ULTRASOUND-GUIDED CORE BIOPSY: INVASIVE MAMMARY CARCINOMA, NO SPECIAL TYPE. Size of invasive carcinoma: 0.6 cm in this sample Histologic grade of invasive carcinoma: Grade 3. ER/PR/HER2: Immunohistochemistry will be performed with reflex to [place] for equivocal HER2-neu (2+) results. The results will be reported in an addendum. This was found to be concordant by Dr. Enriqueta Shutter. Recommendation:  Surgical  and oncology referrals. Results were relayed to Dr. Raylene Miyamoto nurse Wells Guiles, RN on 12/05/16. She stated that Dr. Netty Starring is out of the office and one of the other providers will contact the patient with the results. They will arrange the referrals. The patient was contacted by Jetta Lout, [place] by phone on 12/05/16 for a post biopsy site check. The patient stated she had no bleeding or bruising. She did describe a sensitivity to the bandage with itching and burning under the bandage. She was instructed to remove the clear bandage and leave the steri-strips in place if possible. Post biopsy instructions were reviewed with the patient. All of her questions were answered. She was encouraged to contact Jetta Lout, [place] with any further questions or concerns regarding the biopsy site. Addendum by Jetta Lout, RRA on 12/06/16. Electronically Signed   By: Franki Cabot M.D.   On: 12/06/2016 16:51     Result Date: 12/07/2016 CLINICAL DATA:  Patient with a suspicious right breast mass at the 12:30 o'clock axis presents today for ultrasound-guided biopsy. EXAM: ULTRASOUND GUIDED RIGHT BREAST CORE NEEDLE BIOPSY COMPARISON:  Previous exam(s). PROCEDURE: I met with the patient and we discussed the procedure of ultrasound-guided biopsy, including benefits and alternatives. We discussed the high likelihood of a successful procedure. We discussed the risks of the procedure including infection, bleeding, tissue injury, clip migration, and inadequate sampling. Informed written consent was given. The usual time-out protocol was performed immediately prior to the procedure. Lesion quadrant: Upper inner quadrant Using sterile technique and 1% Lidocaine as local anesthetic, under direct ultrasound visualization, a 12 gauge spring-loaded device was used to perform biopsy of the right breast mass at the 12:30 o'clock axis, 10 cm from the nipple,using a medial approach. At the conclusion of the procedure, a ribbon shaped tissue marker clip was deployed into the biopsy cavity. Follow-up 2-view mammogram was performed and dictated separately. IMPRESSION: Ultrasound-guided biopsy of the right breast mass at the 12:30 o'clock axis. No apparent complications. Electronically Signed: By: Franki Cabot M.D. 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  PLAN:    1. 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 Given the small size of disease seen on ultrasound, it is unclear if she will require adjuvant chemotherapy even if final pathology is reported as triple negative. She will benefit from adjuvant XRT. Given  the ER/PR negativity of her malignancy, an aromatase inhibitor would not offer any benefit. Return to clinic in mid October, or 2 weeks after her lumpectomy, for further evaluation and additional treatment planning. Patient will also have consultation with radiation oncology on that day.  Approximately 60 minutes was spent in discussion of which greater than 50% was consultation.  Patient expressed understanding and was in agreement with this plan. She also understands that She can call clinic at any time with any questions, concerns, or complaints.   Cancer Staging Primary cancer of upper inner quadrant of right female breast [facility]) Staging form: Breast, AJCC 8th Edition - Clinical stage from 01/02/2017: Stage IB (cT1b, cN0, cM0, G3, ER: Negative, PR: Negative, HER2: Equivocal) - Signed by Lloyd Huger, MD on 01/02/2017   Lloyd Huger, MD   01/02/2017 11:20 AM

## 2017-01-04 ENCOUNTER — Ambulatory Visit
Admission: RE | Admit: 2017-01-04 | Discharge: 2017-01-04 | Disposition: A | Discharge status: Home / Self Care | Payer: Medicare Other | Source: Ambulatory Visit | Attending: Surgery | Admitting: Surgery

## 2017-01-04 ENCOUNTER — Ambulatory Visit: Payer: Medicare Other | Admitting: Certified Registered Nurse Anesthetist

## 2017-01-04 ENCOUNTER — Encounter
Admission: RE | Disposition: A | Discharge status: Home / Self Care | Payer: Self-pay | Source: Ambulatory Visit | Attending: Surgery

## 2017-01-04 ENCOUNTER — Encounter
Admission: RE | Admit: 2017-01-04 | Discharge: 2017-01-04 | Disposition: A | Discharge status: Home / Self Care | Payer: Medicare Other | Source: Ambulatory Visit | Attending: Surgery | Admitting: Surgery

## 2017-01-04 ENCOUNTER — Encounter: Payer: Self-pay | Admitting: *Deleted

## 2017-01-04 DIAGNOSIS — Z79899 Other long term (current) drug therapy: Secondary | ICD-10-CM | POA: Diagnosis not present

## 2017-01-04 DIAGNOSIS — Z7982 Long term (current) use of aspirin: Secondary | ICD-10-CM | POA: Insufficient documentation

## 2017-01-04 DIAGNOSIS — E785 Hyperlipidemia, unspecified: Secondary | ICD-10-CM | POA: Insufficient documentation

## 2017-01-04 DIAGNOSIS — C50211 Malignant neoplasm of upper-inner quadrant of right female breast: Secondary | ICD-10-CM

## 2017-01-04 DIAGNOSIS — G4700 Insomnia, unspecified: Secondary | ICD-10-CM | POA: Diagnosis not present

## 2017-01-04 DIAGNOSIS — E890 Postprocedural hypothyroidism: Secondary | ICD-10-CM | POA: Diagnosis not present

## 2017-01-04 DIAGNOSIS — I10 Essential (primary) hypertension: Secondary | ICD-10-CM | POA: Insufficient documentation

## 2017-01-04 DIAGNOSIS — Z171 Estrogen receptor negative status [ER-]: Secondary | ICD-10-CM | POA: Insufficient documentation

## 2017-01-04 DIAGNOSIS — N6312 Unspecified lump in the right breast, upper inner quadrant: Secondary | ICD-10-CM | POA: Diagnosis present

## 2017-01-04 HISTORY — PX: PARTIAL MASTECTOMY WITH NEEDLE LOCALIZATION: SHX6008

## 2017-01-04 HISTORY — PX: SENTINEL NODE BIOPSY: SHX6608

## 2017-01-04 LAB — GLUCOSE, CAPILLARY: GLUCOSE-CAPILLARY: 115 mg/dL — AB (ref 65–99)

## 2017-01-04 SURGERY — PARTIAL MASTECTOMY WITH NEEDLE LOCALIZATION
Anesthesia: General | Laterality: Right

## 2017-01-04 MED ORDER — DEXAMETHASONE SODIUM PHOSPHATE 10 MG/ML IJ SOLN
INTRAMUSCULAR | Status: AC
  Filled 2017-01-04: qty 1

## 2017-01-04 MED ORDER — ONDANSETRON HCL 4 MG/2ML IJ SOLN
INTRAMUSCULAR | Status: DC | PRN
  Administered 2017-01-04: 4 mg via INTRAVENOUS

## 2017-01-04 MED ORDER — LIDOCAINE HCL (CARDIAC) 20 MG/ML IV SOLN
INTRAVENOUS | Status: DC | PRN
  Administered 2017-01-04: 80 mg via INTRAVENOUS

## 2017-01-04 MED ORDER — PROPOFOL 10 MG/ML IV BOLUS
INTRAVENOUS | Status: AC
  Filled 2017-01-04: qty 20

## 2017-01-04 MED ORDER — LACTATED RINGERS IV SOLN
INTRAVENOUS | Status: DC
  Administered 2017-01-04 (×2): via INTRAVENOUS

## 2017-01-04 MED ORDER — FENTANYL CITRATE (PF) 100 MCG/2ML IJ SOLN
INTRAMUSCULAR | Status: AC
  Filled 2017-01-04: qty 2

## 2017-01-04 MED ORDER — SUCCINYLCHOLINE CHLORIDE 20 MG/ML IJ SOLN
INTRAMUSCULAR | Status: DC | PRN
  Administered 2017-01-04: 100 mg via INTRAVENOUS

## 2017-01-04 MED ORDER — FENTANYL CITRATE (PF) 100 MCG/2ML IJ SOLN
25.0000 ug | INTRAMUSCULAR | Status: DC | PRN
  Administered 2017-01-04 (×2): 25 ug via INTRAVENOUS

## 2017-01-04 MED ORDER — MEPERIDINE HCL 50 MG/ML IJ SOLN: 6.2500 mg | INTRAMUSCULAR | Status: DC | PRN

## 2017-01-04 MED ORDER — ACETAMINOPHEN 10 MG/ML IV SOLN
INTRAVENOUS | Status: AC
  Filled 2017-01-04: qty 100

## 2017-01-04 MED ORDER — BUPIVACAINE-EPINEPHRINE (PF) 0.5% -1:200000 IJ SOLN
INTRAMUSCULAR | Status: AC
  Filled 2017-01-04: qty 30

## 2017-01-04 MED ORDER — FENTANYL CITRATE (PF) 100 MCG/2ML IJ SOLN
INTRAMUSCULAR | Status: DC | PRN
  Administered 2017-01-04: 100 ug via INTRAVENOUS
  Administered 2017-01-04: 50 ug via INTRAVENOUS

## 2017-01-04 MED ORDER — EPHEDRINE SULFATE 50 MG/ML IJ SOLN
INTRAMUSCULAR | Status: DC | PRN
  Administered 2017-01-04 (×5): 5 mg via INTRAVENOUS
  Administered 2017-01-04: 10 mg via INTRAVENOUS

## 2017-01-04 MED ORDER — OXYCODONE HCL 5 MG/5ML PO SOLN: 5.0000 mg | Freq: Once | ORAL | Status: DC | PRN

## 2017-01-04 MED ORDER — LIDOCAINE HCL (PF) 2 % IJ SOLN
INTRAMUSCULAR | Status: AC
  Filled 2017-01-04: qty 4

## 2017-01-04 MED ORDER — OXYCODONE HCL 5 MG PO TABS: 5.0000 mg | ORAL_TABLET | Freq: Once | ORAL | Status: DC | PRN

## 2017-01-04 MED ORDER — HYDROCODONE-ACETAMINOPHEN 5-325 MG PO TABS: 1.0000 | ORAL_TABLET | ORAL | Status: DC | PRN

## 2017-01-04 MED ORDER — PROPOFOL 10 MG/ML IV BOLUS
INTRAVENOUS | Status: DC | PRN
  Administered 2017-01-04: 150 mg via INTRAVENOUS
  Administered 2017-01-04: 30 mg via INTRAVENOUS

## 2017-01-04 MED ORDER — PROMETHAZINE HCL 25 MG/ML IJ SOLN: 6.2500 mg | INTRAMUSCULAR | Status: DC | PRN

## 2017-01-04 MED ORDER — FENTANYL CITRATE (PF) 100 MCG/2ML IJ SOLN
INTRAMUSCULAR | Status: AC
  Administered 2017-01-04: 25 ug via INTRAVENOUS
  Filled 2017-01-04: qty 2

## 2017-01-04 MED ORDER — TECHNETIUM TC 99M SULFUR COLLOID
0.7510 | Freq: Once | INTRAVENOUS | Status: AC | PRN
  Administered 2017-01-04: 0.751 via INTRAVENOUS

## 2017-01-04 MED ORDER — DEXAMETHASONE SODIUM PHOSPHATE 10 MG/ML IJ SOLN
INTRAMUSCULAR | Status: DC | PRN
  Administered 2017-01-04: 5 mg via INTRAVENOUS

## 2017-01-04 MED ORDER — ONDANSETRON HCL 4 MG/2ML IJ SOLN
INTRAMUSCULAR | Status: AC
  Filled 2017-01-04: qty 2

## 2017-01-04 MED ORDER — HYDROCODONE-ACETAMINOPHEN 5-325 MG PO TABS: 1.0000 | ORAL_TABLET | ORAL | 0 refills | Status: DC | PRN

## 2017-01-04 MED ORDER — ACETAMINOPHEN 10 MG/ML IV SOLN
INTRAVENOUS | Status: DC | PRN
  Administered 2017-01-04: 1000 mg via INTRAVENOUS

## 2017-01-04 MED ORDER — BUPIVACAINE-EPINEPHRINE 0.5% -1:200000 IJ SOLN
INTRAMUSCULAR | Status: DC | PRN
  Administered 2017-01-04: 12 mL

## 2017-01-04 MED ORDER — FENTANYL CITRATE (PF) 100 MCG/2ML IJ SOLN
INTRAMUSCULAR | Status: AC
  Filled 2017-01-04: qty 4

## 2017-01-04 SURGICAL SUPPLY — 33 items
BLADE SURG 15 STRL LF DISP TIS (BLADE) ×1 IMPLANT
BLADE SURG 15 STRL SS (BLADE) ×2
CANISTER SUCT 1200ML W/VALVE (MISCELLANEOUS) ×3 IMPLANT
CHLORAPREP W/TINT 26ML (MISCELLANEOUS) ×3 IMPLANT
CNTNR SPEC 2.5X3XGRAD LEK (MISCELLANEOUS) ×2
CONT SPEC 4OZ STER OR WHT (MISCELLANEOUS) ×4
CONTAINER SPEC 2.5X3XGRAD LEK (MISCELLANEOUS) ×2 IMPLANT
DERMABOND ADVANCED (GAUZE/BANDAGES/DRESSINGS) ×6
DERMABOND ADVANCED .7 DNX12 (GAUZE/BANDAGES/DRESSINGS) ×3 IMPLANT
DEVICE DUBIN SPECIMEN MAMMOGRA (MISCELLANEOUS) ×3 IMPLANT
DRAPE LAPAROTOMY 77X122 PED (DRAPES) ×3 IMPLANT
ELECT REM PT RETURN 9FT ADLT (ELECTROSURGICAL) ×3
ELECTRODE REM PT RTRN 9FT ADLT (ELECTROSURGICAL) ×1 IMPLANT
GLOVE BIO SURGEON STRL SZ7.5 (GLOVE) ×3 IMPLANT
GOWN STRL REUS W/ TWL LRG LVL3 (GOWN DISPOSABLE) ×2 IMPLANT
GOWN STRL REUS W/TWL LRG LVL3 (GOWN DISPOSABLE) ×4
KIT RM TURNOVER STRD PROC AR (KITS) ×3 IMPLANT
LABEL OR SOLS (LABEL) ×3 IMPLANT
MARGIN MAP 10MM (MISCELLANEOUS) ×3 IMPLANT
NDL SAFETY 18GX1.5 (NEEDLE) ×3 IMPLANT
NDL SAFETY 22GX1.5 (NEEDLE) ×3 IMPLANT
NEEDLE HYPO 25X1 1.5 SAFETY (NEEDLE) ×3 IMPLANT
PACK BASIN MINOR ARMC (MISCELLANEOUS) ×3 IMPLANT
SLEVE PROBE SENORX GAMMA FIND (MISCELLANEOUS) ×3 IMPLANT
SUT CHROMIC 3 0 SH 27 (SUTURE) IMPLANT
SUT CHROMIC 4 0 RB 1X27 (SUTURE) ×3 IMPLANT
SUT ETHILON 3-0 FS-10 30 BLK (SUTURE) ×3
SUT MNCRL 4-0 (SUTURE) ×2
SUT MNCRL 4-0 27XMFL (SUTURE) ×1
SUTURE EHLN 3-0 FS-10 30 BLK (SUTURE) ×1 IMPLANT
SUTURE MNCRL 4-0 27XMF (SUTURE) ×1 IMPLANT
SYRINGE 10CC LL (SYRINGE) ×3 IMPLANT
WATER STERILE IRR 1000ML POUR (IV SOLUTION) ×3 IMPLANT

## 2017-01-04 NOTE — Anesthesia Postprocedure Evaluation (Signed)
Anesthesia Post Note  Patient: Meghan Dougherty  Procedure(s) Performed: Procedure(s) (LRB): PARTIAL MASTECTOMY WITH NEEDLE LOCALIZATION (Right) SENTINEL NODE BIOPSY (Right)  Patient location during evaluation: PACU Anesthesia Type: General Level of consciousness: awake and alert Pain management: pain level controlled Vital Signs Assessment: post-procedure vital signs reviewed and stable Respiratory status: spontaneous breathing and respiratory function stable Cardiovascular status: stable Anesthetic complications: no     Last Vitals:  Vitals:   01/04/17 1514 01/04/17 1525  BP: (!) 156/72 127/64  Pulse: 78 82  Resp: 16 (!) 22  Temp: 36.5 C   SpO2: 100% 100%    Last Pain:  Vitals:   01/04/17 1514  TempSrc: Temporal  PainSc:                  KEPHART,WILLIAM K

## 2017-01-04 NOTE — H&P (Signed)
  She comes prepared for right partial mastectomy with sentinel lymph node biopsy. I reviewed her recent findings of breast cancer. Lab work was reviewed.  She reports no change in overall condition since the office visit.  The right side was marked YES. The mammogram images were reviewed.  I discussed the plan for surgery.

## 2017-01-04 NOTE — Anesthesia Procedure Notes (Signed)
Procedure Name: Intubation Date/Time: 01/04/2017 1:11 PM Performed by: Johnna Acosta Pre-anesthesia Checklist: Patient identified, Emergency Drugs available, Suction available, Patient being monitored and Timeout performed Patient Re-evaluated:Patient Re-evaluated prior to induction Oxygen Delivery Method: Circle system utilized Preoxygenation: Pre-oxygenation with 100% oxygen Induction Type: IV induction Ventilation: Mask ventilation without difficulty Laryngoscope Size: Miller and 2 Grade View: Grade I Tube type: Oral Tube size: 7.0 mm Number of attempts: 1 Airway Equipment and Method: Stylet Placement Confirmation: ETT inserted through vocal cords under direct vision,  positive ETCO2 and breath sounds checked- equal and bilateral Secured at: 21 cm Tube secured with: Tape Dental Injury: Teeth and Oropharynx as per pre-operative assessment

## 2017-01-04 NOTE — Anesthesia Post-op Follow-up Note (Signed)
Anesthesia QCDR form completed.        

## 2017-01-04 NOTE — Discharge Instructions (Addendum)
Take Tylenol or Norco if needed for pain.  Should not drive or do anything dangerous when taking Norco.  May shower.  Wear bra as desired for comfort and support.     AMBULATORY SURGERY  DISCHARGE INSTRUCTIONS   1) The drugs that you were given will stay in your system until tomorrow so for the next 24 hours you should not:  A) Drive an automobile B) Make any legal decisions C) Drink any alcoholic beverage   2) You may resume regular meals tomorrow.  Today it is better to start with liquids and gradually work up to solid foods.  You may eat anything you prefer, but it is better to start with liquids, then soup and crackers, and gradually work up to solid foods.   3) Please notify your doctor immediately if you have any unusual bleeding, trouble breathing, redness and pain at the surgery site, drainage, fever, or pain not relieved by medication.    4) Additional Instructions:        Please contact your physician with any problems or Same Day Surgery at [phone], Monday through Friday 6 am to 4 pm, or [place] at [facility] number at [phone].

## 2017-01-04 NOTE — Anesthesia Preprocedure Evaluation (Signed)
Anesthesia Evaluation  Patient identified by MRN, date of birth, ID band Patient awake    Reviewed: Allergy & Precautions, NPO status , Patient's Chart, lab work & pertinent test results  History of Anesthesia Complications Negative for: history of anesthetic complications  Airway Mallampati: II  TM Distance: >3 FB Neck ROM: Full    Dental  (+) Upper Dentures, Partial Lower   Pulmonary neg pulmonary ROS, neg sleep apnea, neg COPD,    breath sounds clear to auscultation- rhonchi (-) wheezing      Cardiovascular Exercise Tolerance: Good hypertension, Pt. on medications (-) CAD, (-) Past MI and (-) Cardiac Stents  Rhythm:Regular Rate:Normal - Systolic murmurs and - Diastolic murmurs    Neuro/Psych negative neurological ROS  negative psych ROS   GI/Hepatic negative GI ROS, Neg liver ROS,   Endo/Other  neg diabetesHypothyroidism   Renal/GU negative Renal ROS     Musculoskeletal negative musculoskeletal ROS (+)   Abdominal (+) + obese,   Peds  Hematology negative hematology ROS (+)   Anesthesia Other Findings Past Medical History: No date: Borderline diabetes No date: Hyperlipidemia No date: Hypertension No date: Hypothyroidism No date: Insomnia No date: Osteopenia   Reproductive/Obstetrics                             Anesthesia Physical Anesthesia Plan  ASA: II  Anesthesia Plan: General   Post-op Pain Management:    Induction: Intravenous  PONV Risk Score and Plan: 2 and Ondansetron and Dexamethasone  Airway Management Planned: Oral ETT  Additional Equipment:   Intra-op Plan:   Post-operative Plan: Extubation in OR  Informed Consent: I have reviewed the patients History and Physical, chart, labs and discussed the procedure including the risks, benefits and alternatives for the proposed anesthesia with the patient or authorized representative who has indicated his/her  understanding and acceptance.   Dental advisory given  Plan Discussed with: CRNA and Anesthesiologist  Anesthesia Plan Comments:         Anesthesia Quick Evaluation

## 2017-01-04 NOTE — Op Note (Signed)
OPERATIVE REPORT  PREOPERATIVE  DIAGNOSIS: . Carcinoma of the right breast  POSTOPERATIVE DIAGNOSIS: . Carcinoma of the right breast  PROCEDURE: . Right partial mastectomy with axillary sentinel lymph node biopsy  ANESTHESIA:  General  SURGEON: Rochel Brome  MD   INDICATIONS: . She had recent mammogram findings of a density in the upper aspect of the right breast. Ultrasound core needle biopsy was done demonstrating infiltrating mammary carcinoma. Surgery was recommended for definitive treatment. She has had preoperative injection of radioactive technetium sulfur colloid. She also had preoperative x-ray needle localization with insertion of a Kopan's wire. The mammogram images were reviewed prior to surgery  With the patient on the operating table in the supine position she was placed under general anesthesia. The right arm was placed on a lateral arm support. The dressing was removed from the upper aspect of the right breast exposing the Kopan's wire. The wire was cut 2 cm from the skin. The breast and surrounding chest wall were prepared with ChloraPrep and draped in a sterile manner.  A curvilinear incision was made in the upper aspect of the right breast some 12 cm from the nipple from the 11:00 to 1:00 position. A narrow ellipse of skin was excised with the underlying specimen. Dissection was carried down through subcutaneous tissues. Multiple small bleeding points were cauterized. One bleeding point was suture ligated with 3-0 chromic. A mass of tissue surrounding the Kopan's wire was excised. There was some mass formation which could be palpated digitally which helped in the direction of the dissection. The specimen was marked with sutures to attach labels to mark the cranial caudal medial lateral and deep margins. The specimen was submitted for specimen mammogram and pathology. The radiologist called to report the biopsy marker was identified within the specimen mammogram. I reviewed these  images seeing the same. The pathologist later called to report that the margins appeared to be satisfactory.  The gamma counter was used to examine the axilla demonstrating the location of radioactivity in the inferior aspect of the right axilla. An oblique incision was made in the inferior aspect of the axilla and carried down through subcutaneous tissues. Multiple small bleeding points were cauterized. Dissection was carried down to encounter a lymph node with radioactivity. This lymph node was dissected free from surrounding structures with some surrounding fatty tissue. The vascular pedicle was suture ligated with 3-0 chromic. One other clamped bleeding point was suture ligated. The lymph node with surrounding fatty tissue was removed. The ex vivo counts per second were in the range of 150-180. The background count was minimal. The specimen was submitted for routine pathology  The breast wound was further inspected and could see hemostasis was intact. The subcuticular tissues for both wounds were infiltrated with half percent Sensorcaine with epinephrine. The axillary wound was closed with running 4-0 Monocryl subcuticular suture. The breast wound was closed with interrupted 4-0 chromic subcutaneous sutures followed by running 4-0 Monocryl subcuticular suture. Both wounds were treated with Dermabond and allowed to dry.  The patient tolerated surgery satisfactorily and was then prepared for transfer to the recovery room  [place].D.

## 2017-01-04 NOTE — Transfer of Care (Signed)
Immediate Anesthesia Transfer of Care Note  Patient: Meghan Dougherty  Procedure(s) Performed: Procedure(s): PARTIAL MASTECTOMY WITH NEEDLE LOCALIZATION (Right) SENTINEL NODE BIOPSY (Right)  Patient Location: PACU  Anesthesia Type:General  Level of Consciousness: sedated  Airway & Oxygen Therapy: Patient Spontanous Breathing and Patient connected to face mask oxygen  Post-op Assessment: Report given to RN and Post -op Vital signs reviewed and stable  Post vital signs: Reviewed and stable  Last Vitals:  Vitals:   01/04/17 1115 01/04/17 1514  BP: (!) 164/66 (!) 156/72  Pulse: 60 78  Resp: 18 16  Temp: 36.7 C 36.5 C  SpO2: 99% 100%    Last Pain:  Vitals:   01/04/17 1514  TempSrc: Temporal  PainSc:          Complications: No apparent anesthesia complications

## 2017-01-05 ENCOUNTER — Encounter: Payer: Self-pay | Admitting: Surgery

## 2017-01-07 ENCOUNTER — Telehealth: Payer: Self-pay | Admitting: Emergency Medicine

## 2017-01-07 ENCOUNTER — Other Ambulatory Visit: Payer: Self-pay | Admitting: Pathology

## 2017-01-07 ENCOUNTER — Encounter: Payer: Self-pay | Admitting: Emergency Medicine

## 2017-01-07 ENCOUNTER — Emergency Department
Admission: EM | Admit: 2017-01-07 | Discharge: 2017-01-07 | Discharge status: Against Medical Advice | Payer: Medicare Other | Attending: Emergency Medicine | Admitting: Emergency Medicine

## 2017-01-07 DIAGNOSIS — K625 Hemorrhage of anus and rectum: Secondary | ICD-10-CM | POA: Diagnosis present

## 2017-01-07 DIAGNOSIS — Z5321 Procedure and treatment not carried out due to patient leaving prior to being seen by health care provider: Secondary | ICD-10-CM | POA: Insufficient documentation

## 2017-01-07 LAB — COMPREHENSIVE METABOLIC PANEL
ALT: 20 U/L (ref 14–54)
ANION GAP: 12 (ref 5–15)
AST: 31 U/L (ref 15–41)
Albumin: 4.3 g/dL (ref 3.5–5.0)
Alkaline Phosphatase: 85 U/L (ref 38–126)
BUN: 19 mg/dL (ref 6–20)
CHLORIDE: 98 mmol/L — AB (ref 101–111)
CO2: 22 mmol/L (ref 22–32)
Calcium: 9 mg/dL (ref 8.9–10.3)
Creatinine, Ser: 1.05 mg/dL — ABNORMAL HIGH (ref 0.44–1.00)
GFR calc Af Amer: 58 mL/min — ABNORMAL LOW (ref >60)
GFR, EST NON AFRICAN AMERICAN: 50 mL/min — AB (ref >60)
Glucose, Bld: 118 mg/dL — ABNORMAL HIGH (ref 65–99)
POTASSIUM: 3.6 mmol/L (ref 3.5–5.1)
Sodium: 132 mmol/L — ABNORMAL LOW (ref 135–145)
TOTAL PROTEIN: 7.6 g/dL (ref 6.5–8.1)
Total Bilirubin: 0.5 mg/dL (ref 0.3–1.2)

## 2017-01-07 LAB — CBC
HEMATOCRIT: 41.3 % (ref 35.0–47.0)
Hemoglobin: 14.3 g/dL (ref 12.0–16.0)
MCH: 32 pg (ref 26.0–34.0)
MCHC: 34.6 g/dL (ref 32.0–36.0)
MCV: 92.7 fL (ref 80.0–100.0)
Platelets: 254 10*3/uL (ref 150–440)
RBC: 4.45 MIL/uL (ref 3.80–5.20)
RDW: 14 % (ref 11.5–14.5)
WBC: 10.7 10*3/uL (ref 3.6–11.0)

## 2017-01-07 LAB — SURGICAL PATHOLOGY

## 2017-01-07 LAB — TYPE AND SCREEN
ABO/RH(D): O POS
Antibody Screen: NEGATIVE

## 2017-01-07 NOTE — ED Notes (Signed)
Pt states she has been straining to have a bowel movement and now has bleeding with bowel movement. Pt describes as bright red blood.

## 2017-01-07 NOTE — Telephone Encounter (Signed)
Called patient due to lwot to inquire about condition and follow up plans. Pt says she has appt this afternoon with pcp.

## 2017-01-07 NOTE — ED Triage Notes (Signed)
Patient to stat desk asking about wait time. Patient given update on wait time.  

## 2017-01-07 NOTE — ED Triage Notes (Addendum)
Pt reports feeling constipated after her lumpectomy on Friday; says she used suppositories on Sunday and has had 7 bowel movements since; was straining the first few times; after a few bowel movements she has started to notice blood with her stool; denies abd pain; denies nausea and vomiting; pt adds on Thursday she had a small metal piece come off her partial; she is concerned she swallowed it and it is lodged somewhere in her intestines, which could be causing the bleeding

## 2017-01-09 LAB — SURGICAL PATHOLOGY

## 2017-01-10 ENCOUNTER — Encounter: Payer: Self-pay | Admitting: Family Medicine

## 2017-01-21 NOTE — Progress Notes (Signed)
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  INTERVAL HISTORY: Patient returns to clinic today for further evaluation, discussion of her pathology results, and treatment planning. She tolerated her lumpectomy well without significant side effects. She continues to be anxious, but otherwise feels well. She has no neurologic complaints. She denies any recent fevers or illnesses. She has a good appetite and denies weight loss. She denies any pain. She has no chest pain or shortness of breath. She denies any nausea, vomiting, constipation, or diarrhea. She has no urinary complaints. Patient offers no further specific complaints today.  REVIEW OF SYSTEMS:   Review of Systems  Constitutional: Negative.  Negative for fever, malaise/fatigue and weight loss.  Eyes: Negative.   Respiratory: Negative.  Negative for cough and shortness of breath.   Cardiovascular: Negative.  Negative for palpitations and leg swelling.  Gastrointestinal: Negative.  Negative for abdominal pain.  Genitourinary: Negative.   Musculoskeletal: Negative.   Skin: Negative.  Negative for rash.  Neurological: Negative.  Negative for weakness.  Psychiatric/Behavioral: The patient is nervous/anxious.     As per HPI. Otherwise, a complete review of systems is negative.  PAST MEDICAL HISTORY: Past Medical History:  Diagnosis Date  . Borderline diabetes   . Hyperlipidemia   . Hypertension   . Hypothyroidism   . Insomnia   . Osteopenia     PAST SURGICAL HISTORY: Past Surgical History:  Procedure Laterality Date  . ABDOMINAL HYSTERECTOMY     oophorectomy  . APPENDECTOMY    . BREAST BIOPSY Left    neg core  . BREAST  BIOPSY Right    neg  core  . CHOLECYSTECTOMY    . COLONOSCOPY    . EYE SURGERY     bilateral cataracts  . PARTIAL MASTECTOMY WITH NEEDLE LOCALIZATION Right 01/04/2017   Procedure: PARTIAL MASTECTOMY WITH NEEDLE LOCALIZATION;  Surgeon: Leonie Green, MD;  Location: [facility] ORS;  Service: General;  Laterality: Right;  . SENTINEL NODE BIOPSY Right 01/04/2017   Procedure: SENTINEL NODE BIOPSY;  Surgeon: Leonie Green, MD;  Location: [facility] ORS;  Service: General;  Laterality: Right;  . TONSILLECTOMY    . TOTAL THYROIDECTOMY      FAMILY HISTORY: Family History  Problem Relation Age of Onset  . Breast cancer Sister 58  . Lung cancer Sister   . Diabetes Sister   . Breast cancer Maternal Aunt   . Diabetes Mother   . Diabetes Father   . Diabetes Brother   . Diabetes Sister   . Diabetes Sister   . Diabetes Brother     ADVANCED DIRECTIVES (Y/N):  N  HEALTH MAINTENANCE: Social History  Substance Use Topics  . Smoking status: Never Smoker  . Smokeless tobacco: Never Used  . Alcohol use Yes     Comment: occassional     Colonoscopy:  PAP:  Bone density:  Lipid panel:  Allergies  Allergen Reactions  . Trazodone Other (See Comments)    Headache and nightmares  . Penicillins Rash    Has patient had a PCN reaction causing immediate rash, facial/tongue/throat swelling, SOB or lightheadedness with hypotension: No Has patient had a PCN reaction causing severe rash involving mucus membranes or skin necrosis: No Has patient had a PCN  reaction that required hospitalization: No Has patient had a PCN reaction occurring within the last 10 years: No If all of the above answers are "NO", then may proceed with Cephalosporin use.     Current Outpatient Prescriptions  Medication Sig Dispense Refill  . acetaminophen (TYLENOL) 500 MG tablet Take 250-500 mg by mouth every 6 (six) hours as needed for mild pain.    Marland Kitchen amLODipine (NORVASC) 5 MG tablet Take 5 mg by mouth every evening.      Marland Kitchen aspirin EC 81 MG tablet Take 81 mg by mouth daily.    . calcium-vitamin D (OSCAL WITH D) 500-200 MG-UNIT tablet Take 2 tablets by mouth daily.    . fluticasone (FLONASE) 50 MCG/ACT nasal spray Place 1 spray into both nostrils daily as needed for allergies or rhinitis.    . hydrochlorothiazide (HYDRODIURIL) 25 MG tablet Take 25 mg by mouth daily.    Marland Kitchen levothyroxine (SYNTHROID, LEVOTHROID) 50 MCG tablet Take 50 mcg by mouth daily before breakfast.    . lisinopril (PRINIVIL,ZESTRIL) 40 MG tablet Take 40 mg by mouth every evening.     . loperamide (IMODIUM A-D) 2 MG tablet Take 2 mg by mouth 4 (four) times daily as needed for diarrhea or loose stools.    . metoprolol tartrate (LOPRESSOR) 100 MG tablet Take 100 mg by mouth 2 (two) times daily.    . Multiple Vitamin (MULTIVITAMIN WITH MINERALS) TABS tablet Take 2 tablets by mouth daily.    . Omega-3 Fatty Acids (FISH OIL PO) Take 1 capsule by mouth daily.    Marland Kitchen omeprazole (PRILOSEC) 20 MG capsule Take 20 mg by mouth daily as needed (heartburn).    Marland Kitchen PARoxetine (PAXIL) 10 MG tablet Take 10 mg by mouth at bedtime.    . pravastatin (PRAVACHOL) 10 MG tablet Take 10 mg by mouth every evening.    . Probiotic Product (PROBIOTIC DAILY PO) Take 1 capsule by mouth daily.    Marland Kitchen zolpidem (AMBIEN) 5 MG tablet Take 2.5 mg by mouth at bedtime as needed for sleep.    Marland Kitchen HYDROcodone-acetaminophen (NORCO) 5-325 MG tablet Take 1-2 tablets by mouth every 4 (four) hours as needed for moderate pain. (Patient not taking: Reported on 01/23/2017) 12 tablet 0   No current facility-administered medications for this visit.     OBJECTIVE: Vitals:   01/23/17 0954  BP: 115/72  Pulse: 62  Resp: 18  Temp: 97.8 F (36.6 C)     Body mass index is 31.2 kg/m.    ECOG FS:0 - Asymptomatic  General: Well-developed, well-nourished, no acute distress. Eyes: Pink conjunctiva, anicteric sclera. Breasts: Patient requested exam be deferred today. Lungs: Clear to auscultation  bilaterally. Heart: Regular rate and rhythm. No rubs, murmurs, or gallops. Abdomen: Soft, nontender, nondistended. No organomegaly noted, normoactive bowel sounds. Musculoskeletal: No edema, cyanosis, or clubbing. Neuro: Alert, answering all questions appropriately. Cranial nerves grossly intact. Skin: No rashes or petechiae noted. Psych: Normal affect.   LAB RESULTS:  Lab Results  Component Value Date   NA 132 (L) 01/07/2017   K 3.6 01/07/2017   CL 98 (L) 01/07/2017   CO2 22 01/07/2017   GLUCOSE 118 (H) 01/07/2017   BUN 19 01/07/2017   CREATININE 1.05 (H) 01/07/2017   CALCIUM 9.0 01/07/2017   PROT 7.6 01/07/2017   ALBUMIN 4.3 01/07/2017   AST 31 01/07/2017   ALT 20 01/07/2017   ALKPHOS 85 01/07/2017   BILITOT 0.5 01/07/2017   GFRNONAA 50 (L) 01/07/2017   GFRAA 58 (  L) 01/07/2017    Lab Results  Component Value Date   WBC 10.7 01/07/2017   HGB 14.3 01/07/2017   HCT 41.3 01/07/2017   MCV 92.7 01/07/2017   PLT 254 01/07/2017     STUDIES: Nm Sentinel Node Injection  Result Date: 01/04/2017 CLINICAL DATA:  Right breast cancer. EXAM: NUCLEAR MEDICINE BREAST LYMPHOSCINTIGRAPHY TECHNIQUE: Intradermal injection of radiopharmaceutical was performed at the 12 o'clock, 3 o'clock, 6 o'clock, and 9 o'clock positions around the right nipple. The patient was then sent to the operating room where the sentinel node(s) were identified and removed by the surgeon. RADIOPHARMACEUTICALS:  Total of 0.751 mCi Millipore-filtered Technetium-57msulfur colloid. IMPRESSION: Uncomplicated intradermal injection of Technetium-946mulfur colloid for purposes of sentinel node identification. Electronically Signed   By: AdMarkus Daft.D.   On: 01/04/2017 12:51   Mm Breast Surgical Specimen  Result Date: 01/04/2017 CLINICAL DATA:  Needle localization was performed prior to lumpectomy for a known right breast carcinoma. EXAM: SPECIMEN RADIOGRAPH OF THE RIGHT BREAST COMPARISON:  Previous exam(s). FINDINGS:  Status post excision of the right breast. The wire tip, mass, and biopsy marker clip are present and are marked for pathology. IMPRESSION: Specimen radiograph of the right breast. Electronically Signed   By: SuCurlene Dolphin.D.   On: 01/04/2017 14:09   Mm Digital Diagnostic Unilat R  Result Date: 01/04/2017 CLINICAL DATA:  Ultrasound-guided needle localization was performed of a recently biopsied right breast cancer. EXAM: DIAGNOSTIC RIGHT MAMMOGRAM POST ULTRASOUND NEEDLE LOCALIZATION COMPARISON:  Previous exam(s). FINDINGS: Mammographic images were obtained following ultrasound guided needle localization of the biopsy-proven malignancy in the 12:30 position of the right breast. These images show the wire to be satisfactorily positioned within the mass in the associated ribbon shaped biopsy clip. IMPRESSION: Satisfactory position of the preoperative wire placement in the right breast. Final Assessment: Post Procedure Mammograms for wire placement. Electronically Signed   By: SuCurlene Dolphin.D.   On: 01/04/2017 12:39   UsKoreat Plc Breast Loc Dev   1st Lesion  Inc UsKoreauide  Result Date: 01/04/2017 CLINICAL DATA:  The patient presents for needle localization of a in recently biopsy-proven right breast cancer 12:30 position 10 cm from the nipple. EXAM: NEEDLE LOCALIZATION OF THE RIGHT BREAST WITH ULTRASOUND GUIDANCE COMPARISON:  Previous exams. FINDINGS: Patient presents for needle localization prior to lumpectomy. I met with the patient and we discussed the procedure of needle localization including benefits and alternatives. We discussed the high likelihood of a successful procedure. We discussed the risks of the procedure, including infection, bleeding, tissue injury, and further surgery. Informed, written consent was given. The usual time-out protocol was performed immediately prior to the procedure. Using ultrasound guidance, sterile technique, 1% lidocaine and a 7 cm modified Kopans needle, the 0.6 cm mass at  12:30 position 10 cm from the nipple was localized using a lateral to medial approach. The images were marked for Dr. SmTamala JulianIMPRESSION: Needle localization right breast. No apparent complications. Electronically Signed   By: SuCurlene Dolphin.D.   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  PLAN:    1. 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 Although she has triple negative disease, given the small size of tumor of 6 mm and her advanced age, it was agreed upon that adjuvant chemotherapy would not  be necessary. Plus, patient is is hesitant to pursue adjuvant treatment. She will benefit from XRT and has an appointment with radiation oncology later this morning. Given the ER/PR negativity of her malignancy, an aromatase inhibitor would not offer any benefit. Return to clinic 3 months for routine evaluation. Patient will require repeat mammogram in approximately 6 months.  Approximately 30 minutes was spent in discussion of which greater than 50% was consultation.  Patient expressed understanding and was in agreement with this plan. She also understands that She can call clinic at any time with any questions, concerns, or complaints.   Cancer Staging Primary cancer of upper inner quadrant of right female breast [facility]) Staging form: Breast, AJCC 8th Edition - Clinical stage from 01/02/2017: Stage IB (cT1b, cN0, cM0, G3, ER: Negative, PR: Negative, HER2: Negative) - Signed by Lloyd Huger, MD on 01/21/2017 - Pathologic stage from 01/23/2017: Stage IB (pT1b, pN0, cM0, G3, ER: Negative, PR: Negative, HER2: Negative) - Signed by Lloyd Huger, MD on 01/23/2017   Lloyd Huger, MD   01/23/2017 11:34 AM

## 2017-01-23 ENCOUNTER — Inpatient Hospital Stay: Payer: Medicare Other | Attending: Oncology | Admitting: Oncology

## 2017-01-23 ENCOUNTER — Ambulatory Visit
Admission: RE | Admit: 2017-01-23 | Discharge: 2017-01-23 | Disposition: A | Discharge status: Home / Self Care | Payer: Medicare Other | Source: Ambulatory Visit | Attending: Radiation Oncology | Admitting: Radiation Oncology

## 2017-01-23 VITALS — BP 115/72 | HR 62 | Temp 97.8°F | Resp 18 | Wt 157.1 lb

## 2017-01-23 DIAGNOSIS — Z90710 Acquired absence of both cervix and uterus: Secondary | ICD-10-CM | POA: Insufficient documentation

## 2017-01-23 DIAGNOSIS — Z79899 Other long term (current) drug therapy: Secondary | ICD-10-CM | POA: Insufficient documentation

## 2017-01-23 DIAGNOSIS — Z9011 Acquired absence of right breast and nipple: Secondary | ICD-10-CM | POA: Diagnosis not present

## 2017-01-23 DIAGNOSIS — I10 Essential (primary) hypertension: Secondary | ICD-10-CM | POA: Insufficient documentation

## 2017-01-23 DIAGNOSIS — R7303 Prediabetes: Secondary | ICD-10-CM | POA: Insufficient documentation

## 2017-01-23 DIAGNOSIS — E039 Hypothyroidism, unspecified: Secondary | ICD-10-CM | POA: Insufficient documentation

## 2017-01-23 DIAGNOSIS — C50211 Malignant neoplasm of upper-inner quadrant of right female breast: Secondary | ICD-10-CM | POA: Diagnosis present

## 2017-01-23 DIAGNOSIS — G4700 Insomnia, unspecified: Secondary | ICD-10-CM | POA: Diagnosis not present

## 2017-01-23 DIAGNOSIS — C50011 Malignant neoplasm of nipple and areola, right female breast: Secondary | ICD-10-CM | POA: Diagnosis not present

## 2017-01-23 DIAGNOSIS — E785 Hyperlipidemia, unspecified: Secondary | ICD-10-CM | POA: Diagnosis not present

## 2017-01-23 DIAGNOSIS — Z171 Estrogen receptor negative status [ER-]: Secondary | ICD-10-CM | POA: Insufficient documentation

## 2017-01-23 DIAGNOSIS — Z9049 Acquired absence of other specified parts of digestive tract: Secondary | ICD-10-CM | POA: Insufficient documentation

## 2017-01-23 DIAGNOSIS — M8580 Other specified disorders of bone density and structure, unspecified site: Secondary | ICD-10-CM | POA: Insufficient documentation

## 2017-01-23 DIAGNOSIS — Z803 Family history of malignant neoplasm of breast: Secondary | ICD-10-CM | POA: Insufficient documentation

## 2017-01-23 DIAGNOSIS — Z7982 Long term (current) use of aspirin: Secondary | ICD-10-CM | POA: Diagnosis not present

## 2017-01-23 DIAGNOSIS — Z510 Encounter for antineoplastic radiation therapy: Secondary | ICD-10-CM | POA: Insufficient documentation

## 2017-01-23 DIAGNOSIS — Z801 Family history of malignant neoplasm of trachea, bronchus and lung: Secondary | ICD-10-CM | POA: Insufficient documentation

## 2017-01-23 NOTE — Consult Note (Signed)
NEW PATIENT EVALUATION  Name: Meghan Dougherty  MRN: [id number]  Date:   01/23/2017     DOB: 1940-12-25   This 76 y.o. female patient presents to the clinic for initial evaluation of stage I invasive mammary carcinoma triple negative the upper inner quadrant of the right breast status post wide local excision and sentinel node biopsy.  REFERRING PHYSICIAN: Dion Body, MD  CHIEF COMPLAINT: No chief complaint on file.   DIAGNOSIS: There were no encounter diagnoses.   PREVIOUS INVESTIGATIONS:  Mammograms ultrasound reviewed Pathology reports reviewed Clinical notes reviewed  HPI: Patient is a pleasant 76 year old female who presented with an abnormal mammogram of her right breast showing an angular mass measuring approximate 4 mm.. This was consistent confirmed on ultrasound and she underwent targeted biopsy positive for triple negative invasive mammary carcinoma. She went on to have a wide local excision showing a 6 mm invasive mammary carcinoma again triple negative with margins clear at 4 mm. One sentinel lymph node was examined and negative for metastatic disease. Tumor was overall grade 3. She's been seen by medical oncology and based on the small size of her tumor even though it is triple negative they have declined systemic chemotherapy. She also will not be candidate for antiestrogen therapy. She is seen today for radiation oncology opinion. She specifically denies breast tenderness cough or bone pain. She's accompanied by her son today.   PLANNED TREATMENT REGIMEN: Right whole breast radiation  PAST MEDICAL HISTORY:  has a past medical history of Borderline diabetes; Hyperlipidemia; Hypertension; Hypothyroidism; Insomnia; and Osteopenia.    PAST SURGICAL HISTORY:  Past Surgical History:  Procedure Laterality Date  . ABDOMINAL HYSTERECTOMY     oophorectomy  . APPENDECTOMY    . BREAST BIOPSY Left    neg core  . BREAST BIOPSY Right    neg  core  . CHOLECYSTECTOMY     . COLONOSCOPY    . EYE SURGERY     bilateral cataracts  . PARTIAL MASTECTOMY WITH NEEDLE LOCALIZATION Right 01/04/2017   Procedure: PARTIAL MASTECTOMY WITH NEEDLE LOCALIZATION;  Surgeon: Leonie Green, MD;  Location: [facility] ORS;  Service: General;  Laterality: Right;  . SENTINEL NODE BIOPSY Right 01/04/2017   Procedure: SENTINEL NODE BIOPSY;  Surgeon: Leonie Green, MD;  Location: [facility] ORS;  Service: General;  Laterality: Right;  . TONSILLECTOMY    . TOTAL THYROIDECTOMY      FAMILY HISTORY: family history includes Breast cancer in her maternal aunt; Breast cancer (age of onset: 23) in her sister; Diabetes in her brother, brother, father, mother, sister, sister, and sister; Lung cancer in her sister.  SOCIAL HISTORY:  reports that she has never smoked. She has never used smokeless tobacco. She reports that she drinks alcohol. She reports that she does not use drugs.  ALLERGIES: Trazodone and Penicillins  MEDICATIONS:  Current Outpatient Prescriptions  Medication Sig Dispense Refill  . acetaminophen (TYLENOL) 500 MG tablet Take 250-500 mg by mouth every 6 (six) hours as needed for mild pain.    Marland Kitchen amLODipine (NORVASC) 5 MG tablet Take 5 mg by mouth every evening.    Marland Kitchen aspirin EC 81 MG tablet Take 81 mg by mouth daily.    . calcium-vitamin D (OSCAL WITH D) 500-200 MG-UNIT tablet Take 2 tablets by mouth daily.    . fluticasone (FLONASE) 50 MCG/ACT nasal spray Place 1 spray into both nostrils daily as needed for allergies or rhinitis.    . hydrochlorothiazide (HYDRODIURIL) 25 MG tablet Take 25  mg by mouth daily.    Marland Kitchen HYDROcodone-acetaminophen (NORCO) 5-325 MG tablet Take 1-2 tablets by mouth every 4 (four) hours as needed for moderate pain. (Patient not taking: Reported on 01/23/2017) 12 tablet 0  . levothyroxine (SYNTHROID, LEVOTHROID) 50 MCG tablet Take 50 mcg by mouth daily before breakfast.    . lisinopril (PRINIVIL,ZESTRIL) 40 MG tablet Take 40 mg by mouth every evening.     .  loperamide (IMODIUM A-D) 2 MG tablet Take 2 mg by mouth 4 (four) times daily as needed for diarrhea or loose stools.    . metoprolol tartrate (LOPRESSOR) 100 MG tablet Take 100 mg by mouth 2 (two) times daily.    . Multiple Vitamin (MULTIVITAMIN WITH MINERALS) TABS tablet Take 2 tablets by mouth daily.    . Omega-3 Fatty Acids (FISH OIL PO) Take 1 capsule by mouth daily.    Marland Kitchen omeprazole (PRILOSEC) 20 MG capsule Take 20 mg by mouth daily as needed (heartburn).    Marland Kitchen PARoxetine (PAXIL) 10 MG tablet Take 10 mg by mouth at bedtime.    . pravastatin (PRAVACHOL) 10 MG tablet Take 10 mg by mouth every evening.    . Probiotic Product (PROBIOTIC DAILY PO) Take 1 capsule by mouth daily.    Marland Kitchen zolpidem (AMBIEN) 5 MG tablet Take 2.5 mg by mouth at bedtime as needed for sleep.     No current facility-administered medications for this encounter.     ECOG PERFORMANCE STATUS:  0 - Asymptomatic  REVIEW OF SYSTEMS:  Patient denies any weight loss, fatigue, weakness, fever, chills or night sweats. Patient denies any loss of vision, blurred vision. Patient denies any ringing  of the ears or hearing loss. No irregular heartbeat. Patient denies heart murmur or history of fainting. Patient denies any chest pain or pain radiating to her upper extremities. Patient denies any shortness of breath, difficulty breathing at night, cough or hemoptysis. Patient denies any swelling in the lower legs. Patient denies any nausea vomiting, vomiting of blood, or coffee ground material in the vomitus. Patient denies any stomach pain. Patient states has had normal bowel movements no significant constipation or diarrhea. Patient denies any dysuria, hematuria or significant nocturia. Patient denies any problems walking, swelling in the joints or loss of balance. Patient denies any skin changes, loss of hair or loss of weight. Patient denies any excessive worrying or anxiety or significant depression. Patient denies any problems with insomnia.  Patient denies excessive thirst, polyuria, polydipsia. Patient denies any swollen glands, patient denies easy bruising or easy bleeding. Patient denies any recent infections, allergies or URI. Patient "s visual fields have not changed significantly in recent time.    PHYSICAL EXAM: There were no vitals taken for this visit. Right breast has a well healed wide local excision incision. No dominant mass or nodularity is noted in either breast in 2 positions examined. No axillary or supraclavicular adenopathy is appreciated. Well-developed well-nourished patient in NAD. HEENT reveals PERLA, EOMI, discs not visualized.  Oral cavity is clear. No oral mucosal lesions are identified. Neck is clear without evidence of cervical or supraclavicular adenopathy. Lungs are clear to A&P. Cardiac examination is essentially unremarkable with regular rate and rhythm without murmur rub or thrill. Abdomen is benign with no organomegaly or masses noted. Motor sensory and DTR levels are equal and symmetric in the upper and lower extremities. Cranial nerves II through XII are grossly intact. Proprioception is intact. No peripheral adenopathy or edema is identified. No motor or sensory levels are noted.  Crude visual fields are within normal range.  LABORATORY DATA: Pathology reports reviewed   RADIOLOGY RESULTS:Mammogram and ultrasound reviewed  IMPRESSION: Stage I (T1 BN 0 M0) triple negative invasive mammary carcinoma the right breast status post wide local excision and sentinel node biopsy in 76 year old female  PLAN: At this time I recommended a course of whole breast radiation. Her breasts are large and would not be suitable for hypofractionated course of treatment. I would plan on delivering 5040 cGy in 28 fractions to her whole breast. Would also boost her scar another 1400 cGy using electron beam. Risks and benefits of treatment including skin reaction fatigue alteration of blood counts possible inclusion of superficial  lung all were described in detail to the patient. She seems to comprehend my treatment plan well. I first set up and ordered CT simulation for early next week. Patient will not be candidate for antiestrogen therapy based on her triple negative nature of her disease. Patient and son both seem to comprehend my treatment plan well.  I would like to take this opportunity to thank you for allowing me to participate in the care of your patient.Armstead Peaks., MD

## 2017-01-23 NOTE — Progress Notes (Signed)
Pt in today for follow up and results

## 2017-01-28 ENCOUNTER — Ambulatory Visit
Admission: RE | Admit: 2017-01-28 | Discharge: 2017-01-28 | Disposition: A | Discharge status: Home / Self Care | Payer: Medicare Other | Source: Ambulatory Visit | Attending: Radiation Oncology | Admitting: Radiation Oncology

## 2017-01-28 DIAGNOSIS — Z79899 Other long term (current) drug therapy: Secondary | ICD-10-CM | POA: Diagnosis not present

## 2017-01-28 DIAGNOSIS — Z9049 Acquired absence of other specified parts of digestive tract: Secondary | ICD-10-CM | POA: Diagnosis not present

## 2017-01-28 DIAGNOSIS — C50211 Malignant neoplasm of upper-inner quadrant of right female breast: Secondary | ICD-10-CM | POA: Diagnosis present

## 2017-01-28 DIAGNOSIS — I10 Essential (primary) hypertension: Secondary | ICD-10-CM | POA: Diagnosis not present

## 2017-01-28 DIAGNOSIS — E039 Hypothyroidism, unspecified: Secondary | ICD-10-CM | POA: Diagnosis not present

## 2017-01-28 DIAGNOSIS — E785 Hyperlipidemia, unspecified: Secondary | ICD-10-CM | POA: Diagnosis not present

## 2017-01-28 DIAGNOSIS — Z803 Family history of malignant neoplasm of breast: Secondary | ICD-10-CM | POA: Diagnosis not present

## 2017-01-28 DIAGNOSIS — G4700 Insomnia, unspecified: Secondary | ICD-10-CM | POA: Diagnosis not present

## 2017-01-28 DIAGNOSIS — Z801 Family history of malignant neoplasm of trachea, bronchus and lung: Secondary | ICD-10-CM | POA: Diagnosis not present

## 2017-01-28 DIAGNOSIS — M8580 Other specified disorders of bone density and structure, unspecified site: Secondary | ICD-10-CM | POA: Diagnosis not present

## 2017-01-28 DIAGNOSIS — Z90710 Acquired absence of both cervix and uterus: Secondary | ICD-10-CM | POA: Diagnosis not present

## 2017-01-28 DIAGNOSIS — Z9011 Acquired absence of right breast and nipple: Secondary | ICD-10-CM | POA: Diagnosis not present

## 2017-01-28 DIAGNOSIS — Z510 Encounter for antineoplastic radiation therapy: Secondary | ICD-10-CM | POA: Diagnosis not present

## 2017-01-28 DIAGNOSIS — Z171 Estrogen receptor negative status [ER-]: Secondary | ICD-10-CM | POA: Diagnosis not present

## 2017-01-31 DIAGNOSIS — C50211 Malignant neoplasm of upper-inner quadrant of right female breast: Secondary | ICD-10-CM | POA: Diagnosis not present

## 2017-02-01 ENCOUNTER — Other Ambulatory Visit: Payer: Self-pay | Admitting: *Deleted

## 2017-02-01 DIAGNOSIS — C50211 Malignant neoplasm of upper-inner quadrant of right female breast: Secondary | ICD-10-CM

## 2017-02-04 ENCOUNTER — Ambulatory Visit
Admission: RE | Admit: 2017-02-04 | Discharge: 2017-02-04 | Disposition: A | Discharge status: Home / Self Care | Payer: Medicare Other | Source: Ambulatory Visit | Attending: Radiation Oncology | Admitting: Radiation Oncology

## 2017-02-04 DIAGNOSIS — C50211 Malignant neoplasm of upper-inner quadrant of right female breast: Secondary | ICD-10-CM | POA: Diagnosis not present

## 2017-02-05 ENCOUNTER — Ambulatory Visit
Admission: RE | Admit: 2017-02-05 | Discharge: 2017-02-05 | Disposition: A | Discharge status: Home / Self Care | Payer: Medicare Other | Source: Ambulatory Visit | Attending: Radiation Oncology | Admitting: Radiation Oncology

## 2017-02-05 DIAGNOSIS — C50211 Malignant neoplasm of upper-inner quadrant of right female breast: Secondary | ICD-10-CM | POA: Diagnosis not present

## 2017-02-06 ENCOUNTER — Ambulatory Visit
Admission: RE | Admit: 2017-02-06 | Discharge: 2017-02-06 | Disposition: A | Discharge status: Home / Self Care | Payer: Medicare Other | Source: Ambulatory Visit | Attending: Radiation Oncology | Admitting: Radiation Oncology

## 2017-02-06 DIAGNOSIS — C50211 Malignant neoplasm of upper-inner quadrant of right female breast: Secondary | ICD-10-CM | POA: Diagnosis not present

## 2017-02-07 ENCOUNTER — Ambulatory Visit
Admission: RE | Admit: 2017-02-07 | Discharge: 2017-02-07 | Disposition: A | Discharge status: Home / Self Care | Payer: Medicare Other | Source: Ambulatory Visit | Attending: Radiation Oncology | Admitting: Radiation Oncology

## 2017-02-07 DIAGNOSIS — C50211 Malignant neoplasm of upper-inner quadrant of right female breast: Secondary | ICD-10-CM | POA: Diagnosis not present

## 2017-02-08 ENCOUNTER — Ambulatory Visit
Admission: RE | Admit: 2017-02-08 | Discharge: 2017-02-08 | Disposition: A | Discharge status: Home / Self Care | Payer: Medicare Other | Source: Ambulatory Visit | Attending: Radiation Oncology | Admitting: Radiation Oncology

## 2017-02-08 DIAGNOSIS — C50211 Malignant neoplasm of upper-inner quadrant of right female breast: Secondary | ICD-10-CM | POA: Diagnosis not present

## 2017-02-11 ENCOUNTER — Ambulatory Visit
Admission: RE | Admit: 2017-02-11 | Discharge: 2017-02-11 | Disposition: A | Discharge status: Home / Self Care | Payer: Medicare Other | Source: Ambulatory Visit | Attending: Radiation Oncology | Admitting: Radiation Oncology

## 2017-02-11 DIAGNOSIS — C50211 Malignant neoplasm of upper-inner quadrant of right female breast: Secondary | ICD-10-CM | POA: Diagnosis not present

## 2017-02-12 ENCOUNTER — Ambulatory Visit
Admission: RE | Admit: 2017-02-12 | Discharge: 2017-02-12 | Disposition: A | Discharge status: Home / Self Care | Payer: Medicare Other | Source: Ambulatory Visit | Attending: Radiation Oncology | Admitting: Radiation Oncology

## 2017-02-12 DIAGNOSIS — C50211 Malignant neoplasm of upper-inner quadrant of right female breast: Secondary | ICD-10-CM | POA: Diagnosis not present

## 2017-02-13 ENCOUNTER — Ambulatory Visit
Admission: RE | Admit: 2017-02-13 | Discharge: 2017-02-13 | Disposition: A | Discharge status: Home / Self Care | Payer: Medicare Other | Source: Ambulatory Visit | Attending: Radiation Oncology | Admitting: Radiation Oncology

## 2017-02-13 DIAGNOSIS — C50211 Malignant neoplasm of upper-inner quadrant of right female breast: Secondary | ICD-10-CM | POA: Diagnosis not present

## 2017-02-14 ENCOUNTER — Ambulatory Visit
Admission: RE | Admit: 2017-02-14 | Discharge: 2017-02-14 | Disposition: A | Discharge status: Home / Self Care | Payer: Medicare Other | Source: Ambulatory Visit | Attending: Radiation Oncology | Admitting: Radiation Oncology

## 2017-02-14 DIAGNOSIS — C50211 Malignant neoplasm of upper-inner quadrant of right female breast: Secondary | ICD-10-CM | POA: Diagnosis not present

## 2017-02-15 ENCOUNTER — Ambulatory Visit
Admission: RE | Admit: 2017-02-15 | Discharge: 2017-02-15 | Disposition: A | Discharge status: Home / Self Care | Payer: Medicare Other | Source: Ambulatory Visit | Attending: Radiation Oncology | Admitting: Radiation Oncology

## 2017-02-15 DIAGNOSIS — C50211 Malignant neoplasm of upper-inner quadrant of right female breast: Secondary | ICD-10-CM | POA: Diagnosis not present

## 2017-02-18 ENCOUNTER — Inpatient Hospital Stay: Payer: Medicare Other | Attending: Radiation Oncology

## 2017-02-18 ENCOUNTER — Ambulatory Visit
Admission: RE | Admit: 2017-02-18 | Discharge: 2017-02-18 | Disposition: A | Discharge status: Home / Self Care | Payer: Medicare Other | Source: Ambulatory Visit | Attending: Radiation Oncology | Admitting: Radiation Oncology

## 2017-02-18 DIAGNOSIS — E785 Hyperlipidemia, unspecified: Secondary | ICD-10-CM | POA: Diagnosis not present

## 2017-02-18 DIAGNOSIS — E039 Hypothyroidism, unspecified: Secondary | ICD-10-CM | POA: Diagnosis not present

## 2017-02-18 DIAGNOSIS — C50211 Malignant neoplasm of upper-inner quadrant of right female breast: Secondary | ICD-10-CM | POA: Diagnosis present

## 2017-02-18 DIAGNOSIS — Z9011 Acquired absence of right breast and nipple: Secondary | ICD-10-CM | POA: Insufficient documentation

## 2017-02-18 DIAGNOSIS — M8580 Other specified disorders of bone density and structure, unspecified site: Secondary | ICD-10-CM | POA: Insufficient documentation

## 2017-02-18 DIAGNOSIS — G4700 Insomnia, unspecified: Secondary | ICD-10-CM | POA: Diagnosis not present

## 2017-02-18 DIAGNOSIS — R7303 Prediabetes: Secondary | ICD-10-CM | POA: Diagnosis not present

## 2017-02-18 DIAGNOSIS — Z171 Estrogen receptor negative status [ER-]: Secondary | ICD-10-CM | POA: Insufficient documentation

## 2017-02-18 DIAGNOSIS — Z7982 Long term (current) use of aspirin: Secondary | ICD-10-CM | POA: Diagnosis not present

## 2017-02-18 DIAGNOSIS — I10 Essential (primary) hypertension: Secondary | ICD-10-CM | POA: Insufficient documentation

## 2017-02-18 LAB — CBC
HEMATOCRIT: 40.6 % (ref 35.0–47.0)
HEMOGLOBIN: 14 g/dL (ref 12.0–16.0)
MCH: 32.6 pg (ref 26.0–34.0)
MCHC: 34.6 g/dL (ref 32.0–36.0)
MCV: 94.4 fL (ref 80.0–100.0)
Platelets: 260 10*3/uL (ref 150–440)
RBC: 4.3 MIL/uL (ref 3.80–5.20)
RDW: 13.4 % (ref 11.5–14.5)
WBC: 4.6 10*3/uL (ref 3.6–11.0)

## 2017-02-19 ENCOUNTER — Ambulatory Visit
Admission: RE | Admit: 2017-02-19 | Discharge: 2017-02-19 | Disposition: A | Discharge status: Home / Self Care | Payer: Medicare Other | Source: Ambulatory Visit | Attending: Radiation Oncology | Admitting: Radiation Oncology

## 2017-02-19 DIAGNOSIS — C50211 Malignant neoplasm of upper-inner quadrant of right female breast: Secondary | ICD-10-CM | POA: Diagnosis not present

## 2017-02-20 ENCOUNTER — Ambulatory Visit
Admission: RE | Admit: 2017-02-20 | Discharge: 2017-02-20 | Disposition: A | Discharge status: Home / Self Care | Payer: Medicare Other | Source: Ambulatory Visit | Attending: Radiation Oncology | Admitting: Radiation Oncology

## 2017-02-20 DIAGNOSIS — C50211 Malignant neoplasm of upper-inner quadrant of right female breast: Secondary | ICD-10-CM | POA: Diagnosis not present

## 2017-02-21 ENCOUNTER — Ambulatory Visit
Admission: RE | Admit: 2017-02-21 | Discharge: 2017-02-21 | Disposition: A | Discharge status: Home / Self Care | Payer: Medicare Other | Source: Ambulatory Visit | Attending: Radiation Oncology | Admitting: Radiation Oncology

## 2017-02-21 DIAGNOSIS — C50211 Malignant neoplasm of upper-inner quadrant of right female breast: Secondary | ICD-10-CM | POA: Diagnosis not present

## 2017-02-22 ENCOUNTER — Ambulatory Visit
Admission: RE | Admit: 2017-02-22 | Discharge: 2017-02-22 | Disposition: A | Discharge status: Home / Self Care | Payer: Medicare Other | Source: Ambulatory Visit | Attending: Radiation Oncology | Admitting: Radiation Oncology

## 2017-02-22 DIAGNOSIS — C50211 Malignant neoplasm of upper-inner quadrant of right female breast: Secondary | ICD-10-CM | POA: Diagnosis not present

## 2017-02-25 ENCOUNTER — Ambulatory Visit
Admission: RE | Admit: 2017-02-25 | Discharge: 2017-02-25 | Disposition: A | Discharge status: Home / Self Care | Payer: Medicare Other | Source: Ambulatory Visit | Attending: Radiation Oncology | Admitting: Radiation Oncology

## 2017-02-25 DIAGNOSIS — C50211 Malignant neoplasm of upper-inner quadrant of right female breast: Secondary | ICD-10-CM | POA: Diagnosis not present

## 2017-02-26 ENCOUNTER — Ambulatory Visit
Admission: RE | Admit: 2017-02-26 | Discharge: 2017-02-26 | Disposition: A | Discharge status: Home / Self Care | Payer: Medicare Other | Source: Ambulatory Visit | Attending: Radiation Oncology | Admitting: Radiation Oncology

## 2017-02-26 DIAGNOSIS — C50211 Malignant neoplasm of upper-inner quadrant of right female breast: Secondary | ICD-10-CM | POA: Diagnosis not present

## 2017-02-27 ENCOUNTER — Ambulatory Visit
Admission: RE | Admit: 2017-02-27 | Discharge: 2017-02-27 | Disposition: A | Discharge status: Home / Self Care | Payer: Medicare Other | Source: Ambulatory Visit | Attending: Radiation Oncology | Admitting: Radiation Oncology

## 2017-02-27 DIAGNOSIS — C50211 Malignant neoplasm of upper-inner quadrant of right female breast: Secondary | ICD-10-CM | POA: Diagnosis not present

## 2017-03-04 ENCOUNTER — Inpatient Hospital Stay: Payer: Medicare Other

## 2017-03-04 ENCOUNTER — Ambulatory Visit
Admission: RE | Admit: 2017-03-04 | Discharge: 2017-03-04 | Disposition: A | Discharge status: Home / Self Care | Payer: Medicare Other | Source: Ambulatory Visit | Attending: Radiation Oncology | Admitting: Radiation Oncology

## 2017-03-04 DIAGNOSIS — C50211 Malignant neoplasm of upper-inner quadrant of right female breast: Secondary | ICD-10-CM | POA: Diagnosis not present

## 2017-03-04 LAB — CBC
HEMATOCRIT: 39.8 % (ref 35.0–47.0)
HEMOGLOBIN: 13.6 g/dL (ref 12.0–16.0)
MCH: 32.6 pg (ref 26.0–34.0)
MCHC: 34.2 g/dL (ref 32.0–36.0)
MCV: 95.4 fL (ref 80.0–100.0)
Platelets: 200 10*3/uL (ref 150–440)
RBC: 4.17 MIL/uL (ref 3.80–5.20)
RDW: 13.3 % (ref 11.5–14.5)
WBC: 4.8 10*3/uL (ref 3.6–11.0)

## 2017-03-05 ENCOUNTER — Ambulatory Visit
Admission: RE | Admit: 2017-03-05 | Discharge: 2017-03-05 | Disposition: A | Discharge status: Home / Self Care | Payer: Medicare Other | Source: Ambulatory Visit | Attending: Radiation Oncology | Admitting: Radiation Oncology

## 2017-03-05 DIAGNOSIS — C50211 Malignant neoplasm of upper-inner quadrant of right female breast: Secondary | ICD-10-CM | POA: Diagnosis not present

## 2017-03-06 ENCOUNTER — Ambulatory Visit
Admission: RE | Admit: 2017-03-06 | Discharge: 2017-03-06 | Disposition: A | Discharge status: Home / Self Care | Payer: Medicare Other | Source: Ambulatory Visit | Attending: Radiation Oncology | Admitting: Radiation Oncology

## 2017-03-06 ENCOUNTER — Other Ambulatory Visit: Payer: Self-pay | Admitting: *Deleted

## 2017-03-06 DIAGNOSIS — C50211 Malignant neoplasm of upper-inner quadrant of right female breast: Secondary | ICD-10-CM | POA: Diagnosis not present

## 2017-03-06 MED ORDER — SILVER SULFADIAZINE 1 % EX CREA: 1.0000 "application " | TOPICAL_CREAM | Freq: Two times a day (BID) | CUTANEOUS | 6 refills | Status: DC

## 2017-03-07 ENCOUNTER — Ambulatory Visit: Payer: Medicare Other

## 2017-03-08 ENCOUNTER — Ambulatory Visit: Payer: Medicare Other

## 2017-03-11 ENCOUNTER — Ambulatory Visit: Admission: RE | Admit: 2017-03-11 | Payer: Medicare Other | Source: Ambulatory Visit

## 2017-03-11 DIAGNOSIS — C50211 Malignant neoplasm of upper-inner quadrant of right female breast: Secondary | ICD-10-CM | POA: Diagnosis not present

## 2017-03-12 ENCOUNTER — Ambulatory Visit
Admission: RE | Admit: 2017-03-12 | Discharge: 2017-03-12 | Disposition: A | Discharge status: Home / Self Care | Payer: Medicare Other | Source: Ambulatory Visit | Attending: Radiation Oncology | Admitting: Radiation Oncology

## 2017-03-12 DIAGNOSIS — C50211 Malignant neoplasm of upper-inner quadrant of right female breast: Secondary | ICD-10-CM | POA: Diagnosis not present

## 2017-03-13 ENCOUNTER — Ambulatory Visit: Payer: Medicare Other

## 2017-03-13 DIAGNOSIS — C50211 Malignant neoplasm of upper-inner quadrant of right female breast: Secondary | ICD-10-CM | POA: Diagnosis not present

## 2017-03-14 ENCOUNTER — Ambulatory Visit: Payer: Medicare Other

## 2017-03-14 DIAGNOSIS — C50211 Malignant neoplasm of upper-inner quadrant of right female breast: Secondary | ICD-10-CM | POA: Diagnosis not present

## 2017-03-15 ENCOUNTER — Ambulatory Visit: Payer: Medicare Other

## 2017-03-15 DIAGNOSIS — C50211 Malignant neoplasm of upper-inner quadrant of right female breast: Secondary | ICD-10-CM | POA: Diagnosis not present

## 2017-03-18 ENCOUNTER — Inpatient Hospital Stay: Payer: Medicare Other

## 2017-03-18 ENCOUNTER — Ambulatory Visit: Payer: Medicare Other

## 2017-03-19 ENCOUNTER — Ambulatory Visit: Payer: Medicare Other

## 2017-03-19 ENCOUNTER — Ambulatory Visit
Admission: RE | Admit: 2017-03-19 | Discharge: 2017-03-19 | Disposition: A | Discharge status: Home / Self Care | Payer: Medicare Other | Source: Ambulatory Visit | Attending: Radiation Oncology | Admitting: Radiation Oncology

## 2017-03-19 DIAGNOSIS — C50211 Malignant neoplasm of upper-inner quadrant of right female breast: Secondary | ICD-10-CM | POA: Diagnosis not present

## 2017-03-20 ENCOUNTER — Ambulatory Visit
Admission: RE | Admit: 2017-03-20 | Discharge: 2017-03-20 | Disposition: A | Discharge status: Home / Self Care | Payer: Medicare Other | Source: Ambulatory Visit | Attending: Radiation Oncology | Admitting: Radiation Oncology

## 2017-03-20 ENCOUNTER — Ambulatory Visit: Payer: Medicare Other

## 2017-03-20 DIAGNOSIS — C50211 Malignant neoplasm of upper-inner quadrant of right female breast: Secondary | ICD-10-CM | POA: Diagnosis not present

## 2017-03-21 ENCOUNTER — Ambulatory Visit: Payer: Medicare Other

## 2017-03-21 DIAGNOSIS — C50211 Malignant neoplasm of upper-inner quadrant of right female breast: Secondary | ICD-10-CM | POA: Diagnosis not present

## 2017-03-22 ENCOUNTER — Inpatient Hospital Stay: Payer: Medicare Other | Attending: Radiation Oncology

## 2017-03-22 ENCOUNTER — Ambulatory Visit
Admission: RE | Admit: 2017-03-22 | Discharge: 2017-03-22 | Disposition: A | Discharge status: Home / Self Care | Payer: Medicare Other | Source: Ambulatory Visit | Attending: Radiation Oncology | Admitting: Radiation Oncology

## 2017-03-22 DIAGNOSIS — Z9011 Acquired absence of right breast and nipple: Secondary | ICD-10-CM | POA: Diagnosis not present

## 2017-03-22 DIAGNOSIS — Z171 Estrogen receptor negative status [ER-]: Secondary | ICD-10-CM | POA: Insufficient documentation

## 2017-03-22 DIAGNOSIS — C50211 Malignant neoplasm of upper-inner quadrant of right female breast: Secondary | ICD-10-CM | POA: Insufficient documentation

## 2017-03-22 DIAGNOSIS — E785 Hyperlipidemia, unspecified: Secondary | ICD-10-CM | POA: Diagnosis not present

## 2017-03-22 DIAGNOSIS — I10 Essential (primary) hypertension: Secondary | ICD-10-CM | POA: Insufficient documentation

## 2017-03-22 DIAGNOSIS — M8580 Other specified disorders of bone density and structure, unspecified site: Secondary | ICD-10-CM | POA: Insufficient documentation

## 2017-03-22 DIAGNOSIS — G4700 Insomnia, unspecified: Secondary | ICD-10-CM | POA: Insufficient documentation

## 2017-03-22 DIAGNOSIS — R7303 Prediabetes: Secondary | ICD-10-CM | POA: Diagnosis not present

## 2017-03-22 DIAGNOSIS — Z7982 Long term (current) use of aspirin: Secondary | ICD-10-CM | POA: Insufficient documentation

## 2017-03-22 DIAGNOSIS — E039 Hypothyroidism, unspecified: Secondary | ICD-10-CM | POA: Diagnosis not present

## 2017-03-22 LAB — CBC
HEMATOCRIT: 39.4 % (ref 35.0–47.0)
HEMOGLOBIN: 13.4 g/dL (ref 12.0–16.0)
MCH: 32.4 pg (ref 26.0–34.0)
MCHC: 34 g/dL (ref 32.0–36.0)
MCV: 95.2 fL (ref 80.0–100.0)
Platelets: 227 10*3/uL (ref 150–440)
RBC: 4.14 MIL/uL (ref 3.80–5.20)
RDW: 13.1 % (ref 11.5–14.5)
WBC: 5 10*3/uL (ref 3.6–11.0)

## 2017-03-24 ENCOUNTER — Ambulatory Visit: Payer: Medicare Other

## 2017-03-25 ENCOUNTER — Ambulatory Visit: Payer: Medicare Other

## 2017-03-25 ENCOUNTER — Ambulatory Visit
Admission: RE | Admit: 2017-03-25 | Discharge: 2017-03-25 | Disposition: A | Discharge status: Home / Self Care | Payer: Medicare Other | Source: Ambulatory Visit | Attending: Radiation Oncology | Admitting: Radiation Oncology

## 2017-03-25 DIAGNOSIS — C50211 Malignant neoplasm of upper-inner quadrant of right female breast: Secondary | ICD-10-CM | POA: Diagnosis not present

## 2017-03-26 ENCOUNTER — Ambulatory Visit
Admission: RE | Admit: 2017-03-26 | Discharge: 2017-03-26 | Disposition: A | Discharge status: Home / Self Care | Payer: Medicare Other | Source: Ambulatory Visit | Attending: Radiation Oncology | Admitting: Radiation Oncology

## 2017-03-26 ENCOUNTER — Ambulatory Visit: Payer: Medicare Other

## 2017-03-26 DIAGNOSIS — C50211 Malignant neoplasm of upper-inner quadrant of right female breast: Secondary | ICD-10-CM | POA: Diagnosis not present

## 2017-03-27 ENCOUNTER — Ambulatory Visit: Payer: Medicare Other

## 2017-03-27 DIAGNOSIS — C50211 Malignant neoplasm of upper-inner quadrant of right female breast: Secondary | ICD-10-CM | POA: Diagnosis not present

## 2017-03-28 ENCOUNTER — Ambulatory Visit: Payer: Medicare Other

## 2017-03-28 DIAGNOSIS — C50211 Malignant neoplasm of upper-inner quadrant of right female breast: Secondary | ICD-10-CM | POA: Diagnosis not present

## 2017-03-29 ENCOUNTER — Ambulatory Visit: Payer: Medicare Other

## 2017-03-29 DIAGNOSIS — C50211 Malignant neoplasm of upper-inner quadrant of right female breast: Secondary | ICD-10-CM | POA: Diagnosis not present

## 2017-04-01 ENCOUNTER — Ambulatory Visit
Admission: RE | Admit: 2017-04-01 | Discharge: 2017-04-01 | Disposition: A | Discharge status: Home / Self Care | Payer: Medicare Other | Source: Ambulatory Visit | Attending: Radiation Oncology | Admitting: Radiation Oncology

## 2017-04-01 ENCOUNTER — Ambulatory Visit: Payer: Medicare Other

## 2017-04-01 DIAGNOSIS — C50211 Malignant neoplasm of upper-inner quadrant of right female breast: Secondary | ICD-10-CM | POA: Diagnosis not present

## 2017-04-03 ENCOUNTER — Ambulatory Visit: Payer: Medicare Other

## 2017-04-03 ENCOUNTER — Ambulatory Visit
Admission: RE | Admit: 2017-04-03 | Discharge: 2017-04-03 | Disposition: A | Discharge status: Home / Self Care | Payer: Medicare Other | Source: Ambulatory Visit | Attending: Radiation Oncology | Admitting: Radiation Oncology

## 2017-04-03 DIAGNOSIS — C50211 Malignant neoplasm of upper-inner quadrant of right female breast: Secondary | ICD-10-CM | POA: Diagnosis not present

## 2017-04-04 ENCOUNTER — Ambulatory Visit: Payer: Medicare Other

## 2017-04-04 ENCOUNTER — Ambulatory Visit
Admission: RE | Admit: 2017-04-04 | Discharge: 2017-04-04 | Disposition: A | Discharge status: Home / Self Care | Payer: Medicare Other | Source: Ambulatory Visit | Attending: Radiation Oncology | Admitting: Radiation Oncology

## 2017-04-04 DIAGNOSIS — C50211 Malignant neoplasm of upper-inner quadrant of right female breast: Secondary | ICD-10-CM | POA: Diagnosis not present

## 2017-04-05 ENCOUNTER — Ambulatory Visit: Payer: Medicare Other

## 2017-04-21 NOTE — Progress Notes (Signed)
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  INTERVAL HISTORY: Patient returns to clinic today for routine 11-monthevaluation.  She recently completed adjuvant XRT and tolerated it well without significant side effects. She continues to be anxious, but otherwise feels well. She has no neurologic complaints. She denies any recent fevers or illnesses. She has a good appetite and denies weight loss. She denies any pain. She has no chest pain or shortness of breath. She denies any nausea, vomiting, constipation, or diarrhea. She has no urinary complaints. Patient offers no specific complaints today.  REVIEW OF SYSTEMS:   Review of Systems  Constitutional: Negative.  Negative for fever, malaise/fatigue and weight loss.  Eyes: Negative.   Respiratory: Negative.  Negative for cough and shortness of breath.   Cardiovascular: Negative.  Negative for palpitations and leg swelling.  Gastrointestinal: Negative.  Negative for abdominal pain.  Genitourinary: Negative.   Musculoskeletal: Negative.   Skin: Negative.  Negative for rash.  Neurological: Negative.  Negative for weakness.  Psychiatric/Behavioral: The patient is nervous/anxious.     As per HPI. Otherwise, a complete review of systems is negative.  PAST MEDICAL HISTORY: Past Medical History:  Diagnosis Date  . Borderline diabetes   . Hyperlipidemia   . Hypertension   . Hypothyroidism   . Insomnia   . Osteopenia     PAST SURGICAL HISTORY: Past Surgical History:  Procedure Laterality Date  . ABDOMINAL HYSTERECTOMY     oophorectomy  . APPENDECTOMY    . BREAST BIOPSY Left    neg core  . BREAST BIOPSY Right    neg  core  .  CHOLECYSTECTOMY    . COLONOSCOPY    . EYE SURGERY     bilateral cataracts  . PARTIAL MASTECTOMY WITH NEEDLE LOCALIZATION Right 01/04/2017   Procedure: PARTIAL MASTECTOMY WITH NEEDLE LOCALIZATION;  Surgeon: SLeonie Green MD;  Location: [facility] ORS;  Service: General;  Laterality: Right;  . SENTINEL NODE BIOPSY Right 01/04/2017   Procedure: SENTINEL NODE BIOPSY;  Surgeon: SLeonie Green MD;  Location: [facility] ORS;  Service: General;  Laterality: Right;  . TONSILLECTOMY    . TOTAL THYROIDECTOMY      FAMILY HISTORY: Family History  Problem Relation Age of Onset  . Breast cancer Sister 69 . Lung cancer Sister   . Diabetes Sister   . Breast cancer Maternal Aunt   . Diabetes Mother   . Diabetes Father   . Diabetes Brother   . Diabetes Sister   . Diabetes Sister   . Diabetes Brother     ADVANCED DIRECTIVES (Y/N):  N  HEALTH MAINTENANCE: Social History   Tobacco Use  . Smoking status: Never Smoker  . Smokeless tobacco: Never Used  Substance Use Topics  . Alcohol use: Yes    Comment: occassional  . Drug use: No     Colonoscopy:  PAP:  Bone density:  Lipid panel:  Allergies  Allergen Reactions  . Trazodone Other (See Comments)    Headache and nightmares  . Penicillins Rash    Has patient had a PCN reaction causing immediate rash, facial/tongue/throat swelling, SOB or lightheadedness with hypotension: No Has patient had a PCN reaction causing severe rash involving mucus membranes or skin necrosis: No  Has patient had a PCN reaction that required hospitalization: No Has patient had a PCN reaction occurring within the last 10 years: No If all of the above answers are "NO", then may proceed with Cephalosporin use.     Current Outpatient Medications  Medication Sig Dispense Refill  . acetaminophen (TYLENOL) 500 MG tablet Take 250-500 mg by mouth every 6 (six) hours as needed for mild pain.    Marland Kitchen amLODipine (NORVASC) 5 MG tablet Take 5 mg by mouth every evening.      Marland Kitchen aspirin EC 81 MG tablet Take 81 mg by mouth daily.    . calcium-vitamin D (OSCAL WITH D) 500-200 MG-UNIT tablet Take 2 tablets by mouth daily.    . fluticasone (FLONASE) 50 MCG/ACT nasal spray Place 1 spray into both nostrils daily as needed for allergies or rhinitis.    . hydrochlorothiazide (HYDRODIURIL) 25 MG tablet Take 25 mg by mouth daily.    Marland Kitchen HYDROcodone-acetaminophen (NORCO) 5-325 MG tablet Take 1-2 tablets by mouth every 4 (four) hours as needed for moderate pain. 12 tablet 0  . levothyroxine (SYNTHROID, LEVOTHROID) 50 MCG tablet Take 50 mcg by mouth daily before breakfast.    . lisinopril (PRINIVIL,ZESTRIL) 40 MG tablet Take 40 mg by mouth every evening.     . loperamide (IMODIUM A-D) 2 MG tablet Take 2 mg by mouth 4 (four) times daily as needed for diarrhea or loose stools.    . metoprolol tartrate (LOPRESSOR) 100 MG tablet Take 100 mg by mouth 2 (two) times daily.    . Multiple Vitamin (MULTIVITAMIN WITH MINERALS) TABS tablet Take 2 tablets by mouth daily.    . Omega-3 Fatty Acids (FISH OIL PO) Take 1 capsule by mouth daily.    Marland Kitchen omeprazole (PRILOSEC) 20 MG capsule Take 20 mg by mouth daily as needed (heartburn).    Marland Kitchen PARoxetine (PAXIL) 10 MG tablet Take 10 mg by mouth at bedtime.    . pravastatin (PRAVACHOL) 10 MG tablet Take 10 mg by mouth every evening.    . Probiotic Product (PROBIOTIC DAILY PO) Take 1 capsule by mouth daily.    . silver sulfADIAZINE (SILVADENE) 1 % cream Apply 1 application topically 2 (two) times daily. 50 g 6  . zolpidem (AMBIEN) 5 MG tablet Take 2.5 mg by mouth at bedtime as needed for sleep.     No current facility-administered medications for this visit.     OBJECTIVE: Vitals:   04/24/17 1145  BP: 124/73  Pulse: (!) 57  Resp: 18  Temp: 98.9 F (37.2 C)     Body mass index is 31.99 kg/m.    ECOG FS:0 - Asymptomatic  General: Well-developed, well-nourished, no acute distress. Eyes: Pink conjunctiva, anicteric sclera. Breasts: Bilateral  breast and axilla without lumps or masses. Lungs: Clear to auscultation bilaterally. Heart: Regular rate and rhythm. No rubs, murmurs, or gallops. Abdomen: Soft, nontender, nondistended. No organomegaly noted, normoactive bowel sounds. Musculoskeletal: No edema, cyanosis, or clubbing. Neuro: Alert, answering all questions appropriately. Cranial nerves grossly intact. Skin: No rashes or petechiae noted. Psych: Normal affect.   LAB RESULTS:  Lab Results  Component Value Date   NA 132 (L) 01/07/2017   K 3.6 01/07/2017   CL 98 (L) 01/07/2017   CO2 22 01/07/2017   GLUCOSE 118 (H) 01/07/2017   BUN 19 01/07/2017   CREATININE 1.05 (H) 01/07/2017   CALCIUM 9.0 01/07/2017   PROT 7.6 01/07/2017   ALBUMIN 4.3 01/07/2017   AST 31 01/07/2017   ALT 20  01/07/2017   ALKPHOS 85 01/07/2017   BILITOT 0.5 01/07/2017   GFRNONAA 50 (L) 01/07/2017   GFRAA 58 (L) 01/07/2017    Lab Results  Component Value Date   WBC 5.0 03/22/2017   HGB 13.4 03/22/2017   HCT 39.4 03/22/2017   MCV 95.2 03/22/2017   PLT 227 03/22/2017     STUDIES: No results found.  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  PLAN:    1. 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 Although she has triple negative disease, given the small size of tumor of 6 mm and her advanced age, it was agreed upon that adjuvant chemotherapy would not be necessary. Plus, patient had stated she would decline chemotherapy anyway. She has now completed her adjuvant XRT.  Given the ER/PR negativity of her malignancy, an aromatase inhibitor would not offer any benefit.  Patient will require mammogram in approximately 6 months and then she will follow-up will days later for routine evaluation.    Approximately 20 minutes was spent in discussion of which greater than 50% was  consultation.  Patient expressed understanding and was in agreement with this plan. She also understands that She can call clinic at any time with any questions, concerns, or complaints.   Cancer Staging Primary cancer of upper inner quadrant of right female breast [facility]) Staging form: Breast, AJCC 8th Edition - Clinical stage from 01/02/2017: Stage IB (cT1b, cN0, cM0, G3, ER: Negative, PR: Negative, HER2: Negative) - Signed by Lloyd Huger, MD on 01/21/2017 - Pathologic stage from 01/23/2017: Stage IB (pT1b, pN0, cM0, G3, ER: Negative, PR: Negative, HER2: Negative) - Signed by Lloyd Huger, MD on 01/23/2017   Lloyd Huger, MD   04/28/2017 7:43 AM

## 2017-04-24 ENCOUNTER — Inpatient Hospital Stay (HOSPITAL_BASED_OUTPATIENT_CLINIC_OR_DEPARTMENT_OTHER): Payer: Medicare Other | Admitting: Oncology

## 2017-04-24 ENCOUNTER — Inpatient Hospital Stay: Payer: Medicare Other | Attending: Oncology

## 2017-04-24 VITALS — BP 124/73 | HR 57 | Temp 98.9°F | Resp 18 | Wt 161.1 lb

## 2017-04-24 DIAGNOSIS — Z171 Estrogen receptor negative status [ER-]: Secondary | ICD-10-CM | POA: Diagnosis not present

## 2017-04-24 DIAGNOSIS — C50919 Malignant neoplasm of unspecified site of unspecified female breast: Secondary | ICD-10-CM

## 2017-04-24 DIAGNOSIS — C50211 Malignant neoplasm of upper-inner quadrant of right female breast: Secondary | ICD-10-CM | POA: Insufficient documentation

## 2017-04-25 LAB — CA 27.29 (SERIAL MONITOR): CA 27.29: 28.8 U/mL (ref 0.0–38.6)

## 2017-05-13 ENCOUNTER — Ambulatory Visit
Admission: RE | Admit: 2017-05-13 | Discharge: 2017-05-13 | Disposition: A | Discharge status: Home / Self Care | Payer: Medicare Other | Source: Ambulatory Visit | Attending: Radiation Oncology | Admitting: Radiation Oncology

## 2017-05-13 ENCOUNTER — Encounter: Payer: Self-pay | Admitting: Radiation Oncology

## 2017-05-13 ENCOUNTER — Other Ambulatory Visit: Payer: Self-pay

## 2017-05-13 VITALS — BP 108/61 | HR 59 | Temp 97.5°F | Resp 18 | Wt 162.8 lb

## 2017-05-13 DIAGNOSIS — L819 Disorder of pigmentation, unspecified: Secondary | ICD-10-CM | POA: Insufficient documentation

## 2017-05-13 DIAGNOSIS — C50211 Malignant neoplasm of upper-inner quadrant of right female breast: Secondary | ICD-10-CM

## 2017-05-13 DIAGNOSIS — Z171 Estrogen receptor negative status [ER-]: Secondary | ICD-10-CM | POA: Insufficient documentation

## 2017-05-13 DIAGNOSIS — Z923 Personal history of irradiation: Secondary | ICD-10-CM | POA: Diagnosis not present

## 2017-05-13 NOTE — Progress Notes (Signed)
Radiation Oncology Follow up Note  Name: Meghan Dougherty   Date:   05/13/2017 MRN:  [id number] DOB: [birth year]    This 77 y.o. female presents to the clinic today for 1 month follow-up status post whole breast radiation to her right breastFor triple negativeInvasive mammary carcinoma.  REFERRING PROVIDER: Dion Body, MD  HPI: Patient is a 77 year old female now at 1 month having completed whole breast radiation to her right breast for triple negative invasive mammary carcinoma.  Seen today in routine follow-up she is doing well.  She specifically denies breast tenderness cough or bone pain..  COMPLICATIONS OF TREATMENT: none  FOLLOW UP COMPLIANCE: keeps appointments   PHYSICAL EXAM:  BP 108/61   Pulse (!) 59   Temp (!) 97.5 F (36.4 C)   Resp 18   Wt 162 lb 13 oz (73.9 kg)   BMI 32.33 kg/m  Lungs are clear to A&P cardiac examination essentially unremarkable with regular rate and rhythm. No dominant mass or nodularity is noted in either breast in 2 positions examined. Incision is well-healed. No axillary or supraclavicular adenopathy is appreciated. Cosmetic result is excellent.  Still some slight hyperpigmentation of the skin. Well-developed well-nourished patient in NAD. HEENT reveals PERLA, EOMI, discs not visualized.  Oral cavity is clear. No oral mucosal lesions are identified. Neck is clear without evidence of cervical or supraclavicular adenopathy. Lungs are clear to A&P. Cardiac examination is essentially unremarkable with regular rate and rhythm without murmur rub or thrill. Abdomen is benign with no organomegaly or masses noted. Motor sensory and DTR levels are equal and symmetric in the upper and lower extremities. Cranial nerves II through XII are grossly intact. Proprioception is intact. No peripheral adenopathy or edema is identified. No motor or sensory levels are noted. Crude visual fields are within normal range.  RADIOLOGY RESULTS: No current films for  review  PLAN: Present time she is now 1 month out doing well.  She is not on antiestrogen therapy based on triple negative nature of her disease.  I am pleased with her overall progress.  I have asked to see her back in 3-4 months for follow-up.  Patient knows to call sooner with any concerns.  I would like to take this opportunity to thank you for allowing me to participate in the care of your patient.Noreene Filbert, MD

## 2017-10-01 ENCOUNTER — Other Ambulatory Visit: Payer: Self-pay | Admitting: Specialist

## 2017-10-01 DIAGNOSIS — R0602 Shortness of breath: Secondary | ICD-10-CM

## 2017-10-23 ENCOUNTER — Ambulatory Visit: Payer: Medicare Other

## 2017-11-21 ENCOUNTER — Ambulatory Visit
Admission: RE | Admit: 2017-11-21 | Discharge: 2017-11-21 | Disposition: A | Discharge status: Home / Self Care | Payer: Medicare Other | Source: Ambulatory Visit | Attending: Oncology | Admitting: Oncology

## 2017-11-21 DIAGNOSIS — C50211 Malignant neoplasm of upper-inner quadrant of right female breast: Secondary | ICD-10-CM

## 2017-11-21 HISTORY — DX: Personal history of irradiation: Z92.3

## 2017-11-25 ENCOUNTER — Ambulatory Visit: Payer: Medicare Other | Admitting: Radiation Oncology

## 2017-11-26 NOTE — Progress Notes (Signed)
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  INTERVAL HISTORY: Patient returns to clinic today for routine six-month follow-up.  She currently feels well and is asymptomatic. She has no neurologic complaints. She denies any recent fevers or illnesses. She has a good appetite and denies weight loss. She denies any pain. She has no chest pain or shortness of breath. She denies any nausea, vomiting, constipation, or diarrhea. She has no urinary complaints.  Patient feels at her baseline offers no specific complaints today.  REVIEW OF SYSTEMS:   Review of Systems  Constitutional: Negative.  Negative for fever, malaise/fatigue and weight loss.  Eyes: Negative.   Respiratory: Negative.  Negative for cough and shortness of breath.   Cardiovascular: Negative.  Negative for palpitations and leg swelling.  Gastrointestinal: Negative.  Negative for abdominal pain.  Genitourinary: Negative.  Negative for dysuria.  Musculoskeletal: Negative.  Negative for back pain.  Skin: Negative.  Negative for rash.  Neurological: Negative.  Negative for focal weakness, weakness and headaches.  Psychiatric/Behavioral: Negative.  The patient is not nervous/anxious.     As per HPI. Otherwise, a complete review of systems is negative.  PAST MEDICAL HISTORY: Past Medical History:  Diagnosis Date  . Borderline diabetes   . Breast cancer ([place]) 11/2016   invasive mammary carcinoma   . Hyperlipidemia   . Hypertension   . Hypothyroidism   . Insomnia   . Osteopenia   . Personal history of radiation therapy 2018   right breast cancer    PAST SURGICAL HISTORY: Past Surgical History:  Procedure Laterality Date   . ABDOMINAL HYSTERECTOMY     oophorectomy  . APPENDECTOMY    . BREAST BIOPSY Left    neg core  . BREAST BIOPSY Right    neg  core  . BREAST BIOPSY Right 11/2016   invasive mammary carcinoma, [place]  . BREAST BIOPSY Right 11/2016   benign, stero  . BREAST LUMPECTOMY Right 12/2016   invasive mammary carcinoma   . CHOLECYSTECTOMY    . COLONOSCOPY    . EYE SURGERY     bilateral cataracts  . OOPHORECTOMY    . PARTIAL MASTECTOMY WITH NEEDLE LOCALIZATION Right 01/04/2017   Procedure: PARTIAL MASTECTOMY WITH NEEDLE LOCALIZATION;  Surgeon: Leonie Green, MD;  Location: [facility] ORS;  Service: General;  Laterality: Right;  . SENTINEL NODE BIOPSY Right 01/04/2017   Procedure: SENTINEL NODE BIOPSY;  Surgeon: Leonie Green, MD;  Location: [facility] ORS;  Service: General;  Laterality: Right;  . TONSILLECTOMY    . TOTAL THYROIDECTOMY      FAMILY HISTORY: Family History  Problem Relation Age of Onset  . Breast cancer Sister 21  . Lung cancer Sister   . Diabetes Sister   . Breast cancer Maternal Aunt   . Diabetes Mother   . Diabetes Father   . Diabetes Brother   . Diabetes Sister   . Diabetes Sister   . Diabetes Brother     ADVANCED DIRECTIVES (Y/N):  N  HEALTH MAINTENANCE: Social History   Tobacco Use  . Smoking status: Never Smoker  . Smokeless tobacco: Never Used  Substance Use Topics  . Alcohol use: Yes    Comment: occassional  . Drug use: No  Colonoscopy:  PAP:  Bone density:  Lipid panel:  Allergies  Allergen Reactions  . Trazodone Other (See Comments)    Headache and nightmares  . Penicillins Rash    Has patient had a PCN reaction causing immediate rash, facial/tongue/throat swelling, SOB or lightheadedness with hypotension: No Has patient had a PCN reaction causing severe rash involving mucus membranes or skin necrosis: No Has patient had a PCN reaction that required hospitalization: No Has patient had a PCN reaction occurring within the last 10 years:  No If all of the above answers are "NO", then may proceed with Cephalosporin use.     Current Outpatient Medications  Medication Sig Dispense Refill  . acetaminophen (TYLENOL) 500 MG tablet Take 250-500 mg by mouth every 6 (six) hours as needed for mild pain.    Marland Kitchen amLODipine (NORVASC) 5 MG tablet Take 5 mg by mouth every evening.    Marland Kitchen aspirin EC 81 MG tablet Take 81 mg by mouth daily.    . calcium-vitamin D (OSCAL WITH D) 500-200 MG-UNIT tablet Take 2 tablets by mouth daily.    . fluticasone (FLONASE) 50 MCG/ACT nasal spray Place 1 spray into both nostrils daily as needed for allergies or rhinitis.    . hydrochlorothiazide (HYDRODIURIL) 25 MG tablet Take 25 mg by mouth daily.    Marland Kitchen levothyroxine (SYNTHROID, LEVOTHROID) 50 MCG tablet Take 50 mcg by mouth daily before breakfast.    . levothyroxine (SYNTHROID, LEVOTHROID) 75 MCG tablet Take by mouth.    Marland Kitchen lisinopril (PRINIVIL,ZESTRIL) 40 MG tablet Take 40 mg by mouth every evening.     . loperamide (IMODIUM A-D) 2 MG tablet Take 2 mg by mouth 4 (four) times daily as needed for diarrhea or loose stools.    . metoprolol tartrate (LOPRESSOR) 100 MG tablet Take 100 mg by mouth 2 (two) times daily.    . Multiple Vitamin (MULTIVITAMIN WITH MINERALS) TABS tablet Take 2 tablets by mouth daily.    . Omega-3 Fatty Acids (FISH OIL PO) Take 1 capsule by mouth daily.    Marland Kitchen omeprazole (PRILOSEC) 20 MG capsule Take 20 mg by mouth daily as needed (heartburn).    Marland Kitchen PARoxetine (PAXIL) 10 MG tablet Take 10 mg by mouth at bedtime.    . pravastatin (PRAVACHOL) 10 MG tablet Take 10 mg by mouth every evening.    . Probiotic Product (PROBIOTIC DAILY PO) Take 1 capsule by mouth daily.    Marland Kitchen zolpidem (AMBIEN) 5 MG tablet Take 2.5 mg by mouth at bedtime as needed for sleep.    Marland Kitchen HYDROcodone-acetaminophen (NORCO) 5-325 MG tablet Take 1-2 tablets by mouth every 4 (four) hours as needed for moderate pain. (Patient not taking: Reported on 11/27/2017) 12 tablet 0  . silver  sulfADIAZINE (SILVADENE) 1 % cream Apply 1 application topically 2 (two) times daily. (Patient not taking: Reported on 11/27/2017) 50 g 6   No current facility-administered medications for this visit.     OBJECTIVE: There were no vitals filed for this visit.   There is no height or weight on file to calculate BMI.    ECOG FS:0 - Asymptomatic  General: Well-developed, well-nourished, no acute distress. Eyes: Pink conjunctiva, anicteric sclera. HEENT: Normocephalic, moist mucous membranes. Breast: Exam performed by another provider earlier today. Lungs: Clear to auscultation bilaterally. Heart: Regular rate and rhythm. No rubs, murmurs, or gallops. Abdomen: Soft, nontender, nondistended. No organomegaly noted, normoactive bowel sounds. Musculoskeletal: No edema, cyanosis, or clubbing. Neuro: Alert, answering all questions appropriately. Cranial nerves grossly  intact. Skin: No rashes or petechiae noted. Psych: Normal affect.  LAB RESULTS:  Lab Results  Component Value Date   NA 132 (L) 01/07/2017   K 3.6 01/07/2017   CL 98 (L) 01/07/2017   CO2 22 01/07/2017   GLUCOSE 118 (H) 01/07/2017   BUN 19 01/07/2017   CREATININE 1.05 (H) 01/07/2017   CALCIUM 9.0 01/07/2017   PROT 7.6 01/07/2017   ALBUMIN 4.3 01/07/2017   AST 31 01/07/2017   ALT 20 01/07/2017   ALKPHOS 85 01/07/2017   BILITOT 0.5 01/07/2017   GFRNONAA 50 (L) 01/07/2017   GFRAA 58 (L) 01/07/2017    Lab Results  Component Value Date   WBC 5.0 03/22/2017   HGB 13.4 03/22/2017   HCT 39.4 03/22/2017   MCV 95.2 03/22/2017   PLT 227 03/22/2017     STUDIES: Mm Diag Breast Tomo Bilateral  Result Date: 11/21/2017 CLINICAL DATA:  Personal history of right breast cancer status post lumpectomy 2018. EXAM: DIGITAL DIAGNOSTIC BILATERAL MAMMOGRAM WITH CAD AND TOMO COMPARISON:  Previous exam(s). ACR Breast Density Category c: The breast tissue is heterogeneously dense, which may obscure small masses. FINDINGS: Cc and MLO views  of bilateral breasts, spot tangential view of right breast are submitted. Postsurgical changes identified in the right breast. Stable biopsy clip from previous benign biopsies are identified in the right breast unchanged. The left breasts is stable. Mammographic images were processed with CAD. IMPRESSION: Benign findings. RECOMMENDATION: Bilateral diagnostic mammogram in 1 year. I have discussed the findings and recommendations with the patient. Results were also provided in writing at the conclusion of the visit. If applicable, a reminder letter will be sent to the patient regarding the next appointment. BI-RADS CATEGORY  2: Benign. Electronically Signed   By: Abelardo Diesel M.D.   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  PLAN:    1. 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 Although she has triple negative disease, given the small size of tumor of 6 mm and her advanced age, it was agreed upon that adjuvant chemotherapy would not be necessary. Plus, patient had stated she would decline chemotherapy anyway.  She completed adjuvant XRT in January 2019. Given the ER/PR negativity of her malignancy, an aromatase inhibitor would not offer any benefit.  Her most recent mammogram on November 21, 2017 was reported as BI-RADS 2.  Repeat in August 2020.  Patient has requested less frequent and ED visits, therefore she will see radiation oncology in 6 months and follow-up with medical oncology in 1 year.    I spent a total of 20 minutes face-to-face with the patient of which greater than 50% of the visit was spent in counseling and coordination of care as detailed above.   Patient expressed understanding and was in agreement with this plan. She also understands that She can call clinic at any time with any questions,  concerns, or complaints.   Cancer Staging Primary cancer of upper inner quadrant of right female breast [facility]) Staging form: Breast, AJCC 8th Edition - Clinical stage from 01/02/2017: Stage IB (cT1b, cN0, cM0, G3, ER: Negative, PR: Negative, HER2: Negative) - Signed by Lloyd Huger, MD on 01/21/2017 - Pathologic stage from 01/23/2017: Stage IB (pT1b, pN0, cM0, G3, ER: Negative, PR: Negative, HER2: Negative) - Signed by Lloyd Huger, MD  on 01/23/2017   Lloyd Huger, MD   11/30/2017 9:36 AM

## 2017-11-27 ENCOUNTER — Encounter: Payer: Self-pay | Admitting: Radiation Oncology

## 2017-11-27 ENCOUNTER — Inpatient Hospital Stay: Payer: Medicare Other | Attending: Oncology | Admitting: Oncology

## 2017-11-27 ENCOUNTER — Ambulatory Visit
Admission: RE | Admit: 2017-11-27 | Discharge: 2017-11-27 | Disposition: A | Discharge status: Home / Self Care | Payer: Medicare Other | Source: Ambulatory Visit | Attending: Radiation Oncology | Admitting: Radiation Oncology

## 2017-11-27 ENCOUNTER — Other Ambulatory Visit: Payer: Self-pay

## 2017-11-27 ENCOUNTER — Encounter: Payer: Self-pay | Admitting: Oncology

## 2017-11-27 VITALS — BP 152/74 | HR 58 | Temp 97.0°F | Resp 18 | Wt 159.4 lb

## 2017-11-27 DIAGNOSIS — Z171 Estrogen receptor negative status [ER-]: Secondary | ICD-10-CM | POA: Diagnosis not present

## 2017-11-27 DIAGNOSIS — Z853 Personal history of malignant neoplasm of breast: Secondary | ICD-10-CM | POA: Insufficient documentation

## 2017-11-27 DIAGNOSIS — C50211 Malignant neoplasm of upper-inner quadrant of right female breast: Secondary | ICD-10-CM

## 2017-11-27 DIAGNOSIS — Z923 Personal history of irradiation: Secondary | ICD-10-CM | POA: Diagnosis not present

## 2017-11-27 NOTE — Progress Notes (Signed)
Radiation Oncology Follow up Note  Name: Meghan Dougherty   Date:   11/27/2017 MRN:  [id number] DOB: Dec 12, 1940    This 77 y.o. female presents to the clinic today for 6 month follow-up status post whole breast radiation to her right breast for triple negative invasive mammary carcinoma.  REFERRING PROVIDER: Dion Body, MD  HPI: patient is a 77 year old female now seen out 6 months having completed whole breast radiation to her right breast for triple negative invasive mammary carcinoma seen today in routine follow-up she is doing well she has had over this past several months bout of bilateral pneumonia. This was not associated with any of her treatment fields..she recently had a mammogram which I have reviewed was BI-RADS 2 benign. She's not onanti-estrogen therapy based on the triple negative nature of her disease. She specifically denies breast tenderness cough or bone pain.  COMPLICATIONS OF TREATMENT: none  FOLLOW UP COMPLIANCE: keeps appointments   PHYSICAL EXAM:  BP (!) 152/74 (BP Location: Left Arm, Patient Position: Sitting)   Pulse (!) 58   Temp (!) 97 F (36.1 C) (Tympanic)   Resp 18   Wt 159 lb 6.3 oz (72.3 kg)   BMI 31.65 kg/m  Lungs are clear to A&P cardiac examination essentially unremarkable with regular rate and rhythm. No dominant mass or nodularity is noted in either breast in 2 positions examined. Incision is well-healed. No axillary or supraclavicular adenopathy is appreciated. Cosmetic result is excellent. Well-developed well-nourished patient in NAD. HEENT reveals PERLA, EOMI, discs not visualized.  Oral cavity is clear. No oral mucosal lesions are identified. Neck is clear without evidence of cervical or supraclavicular adenopathy. Lungs are clear to A&P. Cardiac examination is essentially unremarkable with regular rate and rhythm without murmur rub or thrill. Abdomen is benign with no organomegaly or masses noted. Motor sensory and DTR levels are equal  and symmetric in the upper and lower extremities. Cranial nerves II through XII are grossly intact. Proprioception is intact. No peripheral adenopathy or edema is identified. No motor or sensory levels are noted. Crude visual fields are within normal range.  RADIOLOGY RESULTS: mammograms are reviewed and compatible above-stated findings  PLAN: present time patient is doing well unusual pattern of pneumonia although it seems to of resolved on multiple antibiotic therapies. Mammograms are fine I'm please were overall progress. I've asked to see her back in 6 months for follow-up. She knows to call sooner with any concerns.  I would like to take this opportunity to thank you for allowing me to participate in the care of your patient.Noreene Filbert, MD

## 2017-11-27 NOTE — Progress Notes (Signed)
Patient denies any concerns today, seen by Dr. Baruch Gouty today as well.

## 2018-01-14 ENCOUNTER — Encounter: Payer: Self-pay | Admitting: *Deleted

## 2018-01-15 ENCOUNTER — Encounter
Admission: RE | Disposition: A | Discharge status: Home / Self Care | Payer: Self-pay | Source: Ambulatory Visit | Attending: Internal Medicine

## 2018-01-15 ENCOUNTER — Ambulatory Visit: Payer: Medicare Other | Admitting: Certified Registered"

## 2018-01-15 ENCOUNTER — Ambulatory Visit
Admission: RE | Admit: 2018-01-15 | Discharge: 2018-01-15 | Disposition: A | Discharge status: Home / Self Care | Payer: Medicare Other | Source: Ambulatory Visit | Attending: Internal Medicine | Admitting: Internal Medicine

## 2018-01-15 ENCOUNTER — Encounter: Payer: Self-pay | Admitting: *Deleted

## 2018-01-15 DIAGNOSIS — I10 Essential (primary) hypertension: Secondary | ICD-10-CM | POA: Insufficient documentation

## 2018-01-15 DIAGNOSIS — E119 Type 2 diabetes mellitus without complications: Secondary | ICD-10-CM | POA: Insufficient documentation

## 2018-01-15 DIAGNOSIS — Z7982 Long term (current) use of aspirin: Secondary | ICD-10-CM | POA: Insufficient documentation

## 2018-01-15 DIAGNOSIS — Z09 Encounter for follow-up examination after completed treatment for conditions other than malignant neoplasm: Secondary | ICD-10-CM | POA: Insufficient documentation

## 2018-01-15 DIAGNOSIS — K640 First degree hemorrhoids: Secondary | ICD-10-CM | POA: Diagnosis not present

## 2018-01-15 DIAGNOSIS — Z79899 Other long term (current) drug therapy: Secondary | ICD-10-CM | POA: Insufficient documentation

## 2018-01-15 DIAGNOSIS — Z79890 Hormone replacement therapy (postmenopausal): Secondary | ICD-10-CM | POA: Diagnosis not present

## 2018-01-15 DIAGNOSIS — E785 Hyperlipidemia, unspecified: Secondary | ICD-10-CM | POA: Insufficient documentation

## 2018-01-15 DIAGNOSIS — E039 Hypothyroidism, unspecified: Secondary | ICD-10-CM | POA: Insufficient documentation

## 2018-01-15 DIAGNOSIS — Z86010 Personal history of colonic polyps: Secondary | ICD-10-CM | POA: Insufficient documentation

## 2018-01-15 DIAGNOSIS — Z923 Personal history of irradiation: Secondary | ICD-10-CM | POA: Diagnosis not present

## 2018-01-15 DIAGNOSIS — K644 Residual hemorrhoidal skin tags: Secondary | ICD-10-CM | POA: Insufficient documentation

## 2018-01-15 DIAGNOSIS — Z853 Personal history of malignant neoplasm of breast: Secondary | ICD-10-CM | POA: Insufficient documentation

## 2018-01-15 HISTORY — PX: COLONOSCOPY WITH PROPOFOL: SHX5780

## 2018-01-15 HISTORY — DX: Type 2 diabetes mellitus without complications: E11.9

## 2018-01-15 HISTORY — DX: Polyp of colon: K63.5

## 2018-01-15 HISTORY — DX: Thyrotoxicosis, unspecified without thyrotoxic crisis or storm: E05.90

## 2018-01-15 HISTORY — DX: Hyperlipidemia, unspecified: E78.5

## 2018-01-15 SURGERY — COLONOSCOPY WITH PROPOFOL
Anesthesia: General

## 2018-01-15 MED ORDER — SODIUM CHLORIDE 0.9 % IV SOLN
INTRAVENOUS | Status: DC
  Administered 2018-01-15: 11:00:00 via INTRAVENOUS

## 2018-01-15 MED ORDER — PROPOFOL 500 MG/50ML IV EMUL
INTRAVENOUS | Status: AC
  Filled 2018-01-15: qty 50

## 2018-01-15 MED ORDER — LIDOCAINE HCL (CARDIAC) PF 100 MG/5ML IV SOSY
PREFILLED_SYRINGE | INTRAVENOUS | Status: DC | PRN
  Administered 2018-01-15: 40 mg via INTRAVENOUS

## 2018-01-15 MED ORDER — PROPOFOL 500 MG/50ML IV EMUL
INTRAVENOUS | Status: DC | PRN
  Administered 2018-01-15: 125 ug/kg/min via INTRAVENOUS

## 2018-01-15 MED ORDER — LIDOCAINE HCL (PF) 2 % IJ SOLN
INTRAMUSCULAR | Status: AC
  Filled 2018-01-15: qty 10

## 2018-01-15 MED ORDER — PROPOFOL 10 MG/ML IV BOLUS
INTRAVENOUS | Status: DC | PRN
  Administered 2018-01-15: 100 mg via INTRAVENOUS

## 2018-01-15 NOTE — Anesthesia Post-op Follow-up Note (Signed)
Anesthesia QCDR form completed.        

## 2018-01-15 NOTE — Interval H&P Note (Signed)
History and Physical Interval Note:  01/15/2018 10:53 AM  Meghan Dougherty  has presented today for surgery, with the diagnosis of PERSONAL HX POLYPS  The various methods of treatment have been discussed with the patient and family. After consideration of risks, benefits and other options for treatment, the patient has consented to  Procedure(s): COLONOSCOPY WITH PROPOFOL (N/A) as a surgical intervention .  The patient's history has been reviewed, patient examined, no change in status, stable for surgery.  I have reviewed the patient's chart and labs.  Questions were answered to the patient's satisfaction.     [name], [place]

## 2018-01-15 NOTE — Anesthesia Postprocedure Evaluation (Signed)
Anesthesia Post Note  Patient: Makeshia Seat  Procedure(s) Performed: COLONOSCOPY WITH PROPOFOL (N/A )  Patient location during evaluation: Endoscopy Anesthesia Type: General Level of consciousness: awake and alert, oriented and patient cooperative Pain management: satisfactory to patient Vital Signs Assessment: post-procedure vital signs reviewed and stable Respiratory status: spontaneous breathing and respiratory function stable Cardiovascular status: blood pressure returned to baseline and stable Postop Assessment: no backache, no headache, patient able to bend at knees, no apparent nausea or vomiting, adequate PO intake and able to ambulate Anesthetic complications: no     Last Vitals:  Vitals:   01/15/18 1001 01/15/18 1221  BP: (!) 157/71 (!) 141/74  Pulse: 62 74  Resp: 16 (!) 21  Temp: (!) 36.2 C 36.7 C  SpO2: 100% 94%    Last Pain:  Vitals:   01/15/18 1221  TempSrc: Tympanic  PainSc: Asleep                 Robley Matassa H Malashia Kamaka

## 2018-01-15 NOTE — Op Note (Signed)
[facility] Gastroenterology Patient Name: Meghan Dougherty Procedure Date: 01/15/2018 11:42 AM MRN: [id number] Account #: [id number] Date of Birth: December 07, 1940 Admit Type: Outpatient Age: 77 Room: [facility] ENDO ROOM 2 Gender: Female Note Status: Finalized Procedure:            Colonoscopy Indications:          High risk colon cancer surveillance: Personal history                        of colonic polyps Providers:            Benay Pike. Alice Reichert MD, MD Referring MD:         Dion Body (Referring MD) Medicines:            Propofol per Anesthesia Complications:        No immediate complications. Procedure:            Pre-Anesthesia Assessment:                       - The risks and benefits of the procedure and the                        sedation options and risks were discussed with the                        patient. All questions were answered and informed                        consent was obtained.                       - Patient identification and proposed procedure were                        verified prior to the procedure by the nurse. The                        procedure was verified in the procedure room.                       - ASA Grade Assessment: III - A patient with severe                        systemic disease.                       - After reviewing the risks and benefits, the patient                        was deemed in satisfactory condition to undergo the                        procedure.                       After obtaining informed consent, the colonoscope was                        passed under direct vision. Throughout the procedure,  the patient's blood pressure, pulse, and oxygen                        saturations were monitored continuously. The                        Colonoscope was introduced through the anus and                        advanced to the the cecum, identified by appendiceal   orifice and ileocecal valve. The colonoscopy was                        performed without difficulty. The patient tolerated the                        procedure well. The quality of the bowel preparation                        was good. The ileocecal valve, appendiceal orifice, and                        rectum were photographed. Findings:      The perianal exam findings include non-thrombosed external hemorrhoids       and internal hemorrhoids that prolapse with straining, but spontaneously       regress to the resting position (Grade II).      The colon (entire examined portion) appeared normal.      Non-bleeding internal hemorrhoids were found during retroflexion. The       hemorrhoids were Grade I (internal hemorrhoids that do not prolapse).      The exam was otherwise without abnormality. Impression:           - Non-thrombosed external hemorrhoids and internal                        hemorrhoids that prolapse with straining, but                        spontaneously regress to the resting position (Grade                        II) found on perianal exam.                       - The entire examined colon is normal.                       - Non-bleeding internal hemorrhoids.                       - The examination was otherwise normal.                       - No specimens collected. Recommendation:       - Patient has a contact number available for                        emergencies. The signs and symptoms of potential                        delayed complications  were discussed with the patient.                        Return to normal activities tomorrow. Written discharge                        instructions were provided to the patient.                       - Resume previous diet.                       - Continue present medications.                       - No repeat colonoscopy due to current age (33 years or                        older) and the absence of advanced adenomas.                        - Return to GI office after studies are complete. Procedure Code(s):    --- Professional ---                       M8413, Colorectal cancer screening; colonoscopy on                        individual at high risk Diagnosis Code(s):    --- Professional ---                       K64.4, Residual hemorrhoidal skin tags                       K64.1, Second degree hemorrhoids                       Z86.010, Personal history of colonic polyps CPT copyright 2018 American Medical Association. All rights reserved. The codes documented in this report are preliminary and upon coder review may  be revised to meet current compliance requirements. Efrain Sella MD, MD 01/15/2018 12:17:04 PM This report has been signed electronically. Number of Addenda: 0 Note Initiated On: 01/15/2018 11:42 AM Scope Withdrawal Time: 0 hours 6 minutes 22 seconds  Total Procedure Duration: 0 hours 11 minutes 49 seconds       [facility]

## 2018-01-15 NOTE — Anesthesia Preprocedure Evaluation (Signed)
Anesthesia Evaluation  Patient identified by MRN, date of birth, ID band Patient awake    Reviewed: Allergy & Precautions, NPO status , Patient's Chart, lab work & pertinent test results  History of Anesthesia Complications Negative for: history of anesthetic complications  Airway Mallampati: II  TM Distance: >3 FB Neck ROM: Full    Dental  (+) Upper Dentures, Partial Lower   Pulmonary neg pulmonary ROS, neg sleep apnea, neg COPD,    breath sounds clear to auscultation- rhonchi (-) wheezing      Cardiovascular Exercise Tolerance: Good hypertension, Pt. on medications (-) CAD, (-) Past MI and (-) Cardiac Stents  Rhythm:Regular Rate:Normal - Systolic murmurs and - Diastolic murmurs    Neuro/Psych negative neurological ROS  negative psych ROS   GI/Hepatic negative GI ROS, Neg liver ROS,   Endo/Other  diabetesHypothyroidism   Renal/GU negative Renal ROS     Musculoskeletal negative musculoskeletal ROS (+)   Abdominal (+) + obese,   Peds  Hematology negative hematology ROS (+)   Anesthesia Other Findings Past Medical History: No date: Borderline diabetes No date: Hyperlipidemia No date: Hypertension No date: Hypothyroidism No date: Insomnia No date: Osteopenia   Reproductive/Obstetrics                             Anesthesia Physical  Anesthesia Plan  ASA: II  Anesthesia Plan: General   Post-op Pain Management:    Induction: Intravenous  PONV Risk Score and Plan: Propofol infusion  Airway Management Planned: Nasal Cannula  Additional Equipment:   Intra-op Plan:   Post-operative Plan:   Informed Consent: I have reviewed the patients History and Physical, chart, labs and discussed the procedure including the risks, benefits and alternatives for the proposed anesthesia with the patient or authorized representative who has indicated his/her understanding and acceptance.    Dental advisory given  Plan Discussed with: CRNA and Anesthesiologist  Anesthesia Plan Comments:         Anesthesia Quick Evaluation

## 2018-01-15 NOTE — H&P (Signed)
Outpatient short stay form Pre-procedure 01/15/2018 10:52 AM Meghan Dougherty, M.D.  Primary Physician: Dion Body, MD  Reason for visit: Personal history of colon polyps  History of present illness: Patient presents for colon polyp surveillance.Patient denies change in bowel habits, rectal bleeding, weight loss or abdominal pain.     Current Facility-Administered Medications:  .  0.9 %  sodium chloride infusion, , Intravenous, Continuous, Olla, Benay Pike, MD, Last Rate: 20 mL/hr at 01/15/18 1030  Medications Prior to Admission  Medication Sig Dispense Refill Last Dose  . amLODipine (NORVASC) 5 MG tablet Take 5 mg by mouth every evening.   01/14/2018 at Unknown time  . aspirin EC 81 MG tablet Take 81 mg by mouth daily.   Past Week at Unknown time  . calcium-vitamin D (OSCAL WITH D) 500-200 MG-UNIT tablet Take 2 tablets by mouth daily.   Past Week at Unknown time  . fluticasone (FLONASE) 50 MCG/ACT nasal spray Place 1 spray into both nostrils daily as needed for allergies or rhinitis.   Past Week at Unknown time  . hydrochlorothiazide (HYDRODIURIL) 25 MG tablet Take 25 mg by mouth daily.   01/14/2018 at Unknown time  . levothyroxine (SYNTHROID, LEVOTHROID) 50 MCG tablet Take 50 mcg by mouth daily before breakfast.   01/14/2018 at Unknown time  . levothyroxine (SYNTHROID, LEVOTHROID) 75 MCG tablet Take by mouth.   01/14/2018 at Unknown time  . lisinopril (PRINIVIL,ZESTRIL) 40 MG tablet Take 40 mg by mouth every evening.    01/14/2018 at Unknown time  . metoprolol tartrate (LOPRESSOR) 100 MG tablet Take 100 mg by mouth 2 (two) times daily.   01/15/2018 at 0630  . Multiple Vitamin (MULTIVITAMIN WITH MINERALS) TABS tablet Take 2 tablets by mouth daily.   Past Week at Unknown time  . Omega-3 Fatty Acids (FISH OIL PO) Take 1 capsule by mouth daily.   Past Week at Unknown time  . omeprazole (PRILOSEC) 20 MG capsule Take 20 mg by mouth daily as needed (heartburn).   01/14/2018 at Unknown time   . PARoxetine (PAXIL) 10 MG tablet Take 10 mg by mouth at bedtime.   01/14/2018 at Unknown time  . pravastatin (PRAVACHOL) 10 MG tablet Take 10 mg by mouth every evening.   01/14/2018 at Unknown time  . Probiotic Product (PROBIOTIC DAILY PO) Take 1 capsule by mouth daily.   Past Week at Unknown time  . zolpidem (AMBIEN) 5 MG tablet Take 2.5 mg by mouth at bedtime as needed for sleep.   Past Week at Unknown time  . acetaminophen (TYLENOL) 500 MG tablet Take 250-500 mg by mouth every 6 (six) hours as needed for mild pain.   Taking  . HYDROcodone-acetaminophen (NORCO) 5-325 MG tablet Take 1-2 tablets by mouth every 4 (four) hours as needed for moderate pain. (Patient not taking: Reported on 11/27/2017) 12 tablet 0 Not Taking  . loperamide (IMODIUM A-D) 2 MG tablet Take 2 mg by mouth 4 (four) times daily as needed for diarrhea or loose stools.   Taking  . silver sulfADIAZINE (SILVADENE) 1 % cream Apply 1 application topically 2 (two) times daily. (Patient not taking: Reported on 11/27/2017) 50 g 6 Not Taking     Allergies  Allergen Reactions  . Levaquin [Levofloxacin] Rash  . Penicillins Rash    Has patient had a PCN reaction causing immediate rash, facial/tongue/throat swelling, SOB or lightheadedness with hypotension: No Has patient had a PCN reaction causing severe rash involving mucus membranes or skin necrosis: No Has patient  had a PCN reaction that required hospitalization: No Has patient had a PCN reaction occurring within the last 10 years: No If all of the above answers are "NO", then may proceed with Cephalosporin use.   . Trazodone Other (See Comments)    Headache and nightmares     Past Medical History:  Diagnosis Date  . Borderline diabetes   . Breast cancer ([place]) 11/2016   invasive mammary carcinoma   . Colon polyp   . Diabetes mellitus without complication ([place])    type 2  . Hyperlipemia   . Hypertension   . Hyperthyroidism   . Hypothyroidism   . Insomnia   . Osteopenia    . Personal history of radiation therapy 2018   right breast cancer    Review of systems:  Otherwise negative.    Physical Exam  Gen: Alert, oriented. Appears stated age.  HEENT: [place]/AT. PERRLA. Lungs: CTA, no wheezes. CV: RR nl S1, S2. Abd: soft, benign, no masses. BS+ Ext: No edema. Pulses 2+    Planned procedures: Proceed with colonoscopy. The patient understands the nature of the planned procedure, indications, risks, alternatives and potential complications including but not limited to bleeding, infection, perforation, damage to internal organs and possible oversedation/side effects from anesthesia. The patient agrees and gives consent to proceed.  Please refer to procedure notes for findings, recommendations and patient disposition/instructions.     Meghan Dougherty, M.D. Gastroenterology 01/15/2018  10:52 AM

## 2018-01-15 NOTE — Transfer of Care (Signed)
Immediate Anesthesia Transfer of Care Note  Patient: Meghan Dougherty  Procedure(s) Performed: COLONOSCOPY WITH PROPOFOL (N/A )  Patient Location: PACU and Endoscopy Unit  Anesthesia Type:General  Level of Consciousness: drowsy and patient cooperative  Airway & Oxygen Therapy: Patient Spontanous Breathing  Post-op Assessment: Report given to RN, Post -op Vital signs reviewed and stable and Patient moving all extremities  Post vital signs: Reviewed and stable  Last Vitals:  Vitals Value Taken Time  BP    Temp    Pulse 83 01/15/2018 12:21 PM  Resp 18 01/15/2018 12:21 PM  SpO2 95 % 01/15/2018 12:21 PM  Vitals shown include unvalidated device data.  Last Pain:  Vitals:   01/15/18 1221  TempSrc: (P) Tympanic  PainSc:          Complications: No apparent anesthesia complications

## 2018-01-17 ENCOUNTER — Encounter: Payer: Self-pay | Admitting: Internal Medicine

## 2018-06-05 ENCOUNTER — Other Ambulatory Visit: Payer: Self-pay

## 2018-06-05 ENCOUNTER — Encounter: Payer: Self-pay | Admitting: Radiation Oncology

## 2018-06-05 ENCOUNTER — Other Ambulatory Visit: Payer: Self-pay | Admitting: *Deleted

## 2018-06-05 ENCOUNTER — Ambulatory Visit
Admission: RE | Admit: 2018-06-05 | Discharge: 2018-06-05 | Disposition: A | Discharge status: Home / Self Care | Payer: Medicare Other | Source: Ambulatory Visit | Attending: Radiation Oncology | Admitting: Radiation Oncology

## 2018-06-05 VITALS — BP 163/84 | HR 65 | Temp 97.3°F | Resp 20 | Wt 159.0 lb

## 2018-06-05 DIAGNOSIS — C50211 Malignant neoplasm of upper-inner quadrant of right female breast: Secondary | ICD-10-CM

## 2018-06-05 DIAGNOSIS — Z853 Personal history of malignant neoplasm of breast: Secondary | ICD-10-CM | POA: Diagnosis not present

## 2018-06-05 DIAGNOSIS — R9389 Abnormal findings on diagnostic imaging of other specified body structures: Secondary | ICD-10-CM

## 2018-06-05 DIAGNOSIS — Z923 Personal history of irradiation: Secondary | ICD-10-CM | POA: Diagnosis not present

## 2018-06-05 DIAGNOSIS — Z171 Estrogen receptor negative status [ER-]: Secondary | ICD-10-CM

## 2018-06-05 NOTE — Progress Notes (Signed)
Radiation Oncology Follow up Note  Name: Meghan Dougherty   Date:   06/05/2018 MRN:  [id number] DOB: 09/10/1940    This 78 y.o. 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 She's had multiple plain films although I cannot see those reports or films in her medical record. She specifically denies cough hemoptysis or chest tightness. She specifically denies breast tenderness..her last mammogram was back in August was a BI-RADS 2 benign which I have reviewed.  COMPLICATIONS OF TREATMENT: none  FOLLOW UP COMPLIANCE: keeps appointments   PHYSICAL EXAM:  BP (!) 163/84 (BP Location: Left Arm, Patient Position: Sitting)   Pulse 65   Temp (!) 97.3 F (36.3 C) (Tympanic)   Resp 20   Wt 158 lb 15.2 oz (72.1 kg)   BMI 31.57 kg/m  Lungs are clear to A&P cardiac examination essentially unremarkable with regular rate and rhythm. No dominant mass or nodularity is noted in either breast in 2 positions examined. Incision is well-healed. No axillary or supraclavicular adenopathy is appreciated. Cosmetic result is excellent.Well-developed well-nourished patient in NAD. HEENT reveals PERLA, EOMI, discs not visualized.  Oral cavity is clear. No oral mucosal lesions are identified. Neck is clear without evidence of cervical or supraclavicular adenopathy. Lungs are clear to A&P. Cardiac examination is essentially unremarkable with regular rate and rhythm without murmur rub or thrill. Abdomen is benign with no organomegaly or masses noted. Motor sensory and DTR levels are  equal and symmetric in the upper and lower extremities. Cranial nerves II through XII are grossly intact. Proprioception is intact. No peripheral adenopathy or edema is identified. No motor or sensory levels are noted. Crude visual fields are within normal range.  RADIOLOGY RESULTS: I've ordered a CT scan of her chest  PLAN: Iat this time from a breast and point she is doing well with no evidence of disease. I'm please were overall progress. I've ordered a CT scan of her chest just because her breast cancer diagnosis being triple -2 more clarify her lung situation.    Noreene Filbert, MD

## 2018-06-11 ENCOUNTER — Other Ambulatory Visit: Payer: Self-pay

## 2018-06-11 ENCOUNTER — Ambulatory Visit
Admission: RE | Admit: 2018-06-11 | Discharge: 2018-06-11 | Disposition: A | Discharge status: Home / Self Care | Payer: Medicare Other | Source: Ambulatory Visit | Attending: Radiation Oncology | Admitting: Radiation Oncology

## 2018-06-11 DIAGNOSIS — C50211 Malignant neoplasm of upper-inner quadrant of right female breast: Secondary | ICD-10-CM

## 2018-06-11 DIAGNOSIS — Z171 Estrogen receptor negative status [ER-]: Secondary | ICD-10-CM | POA: Diagnosis present

## 2018-06-11 DIAGNOSIS — R9389 Abnormal findings on diagnostic imaging of other specified body structures: Secondary | ICD-10-CM | POA: Diagnosis present

## 2018-06-11 HISTORY — DX: Malignant neoplasm of unspecified site of right female breast: C50.911

## 2018-06-11 LAB — POCT I-STAT CREATININE: CREATININE: 0.8 mg/dL (ref 0.44–1.00)

## 2018-06-11 MED ORDER — IOHEXOL 300 MG/ML  SOLN
75.0000 mL | Freq: Once | INTRAMUSCULAR | Status: AC | PRN
  Administered 2018-06-11: 75 mL via INTRAVENOUS

## 2018-10-30 IMAGING — MG MM DIGITAL SCREENING BILAT W/ TOMO W/ CAD
8 of 13 series · 8 of 29 positions shown · non-contrast
Comparison: Previous exam(s).

CLINICAL DATA: Screening.

EXAM:
2D DIGITAL SCREENING BILATERAL MAMMOGRAM WITH CAD AND ADJUNCT TOMO

[L MLO]
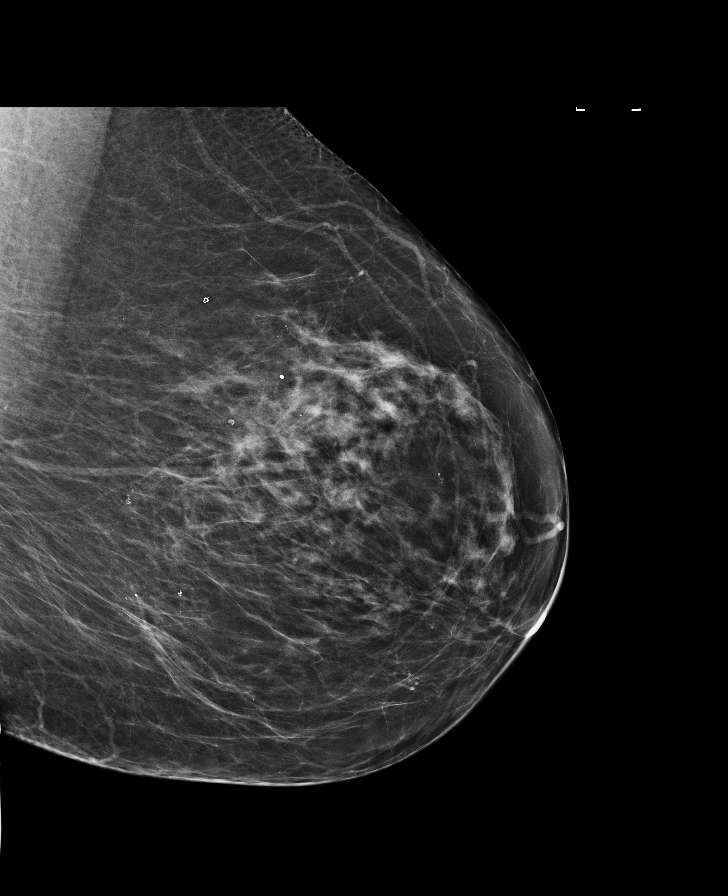

[R MLO]
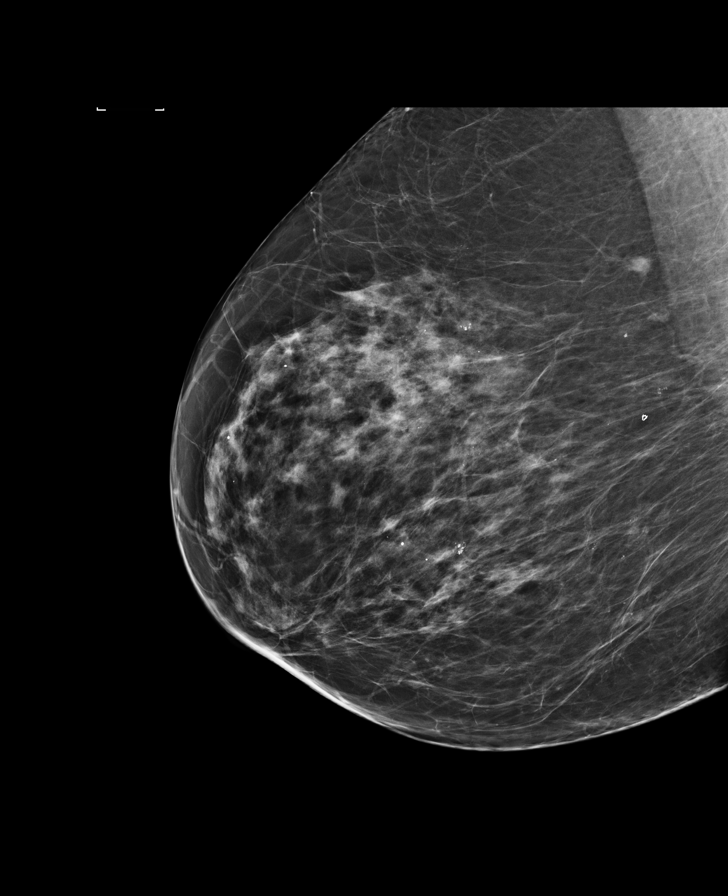

[R MLO synth-2D]
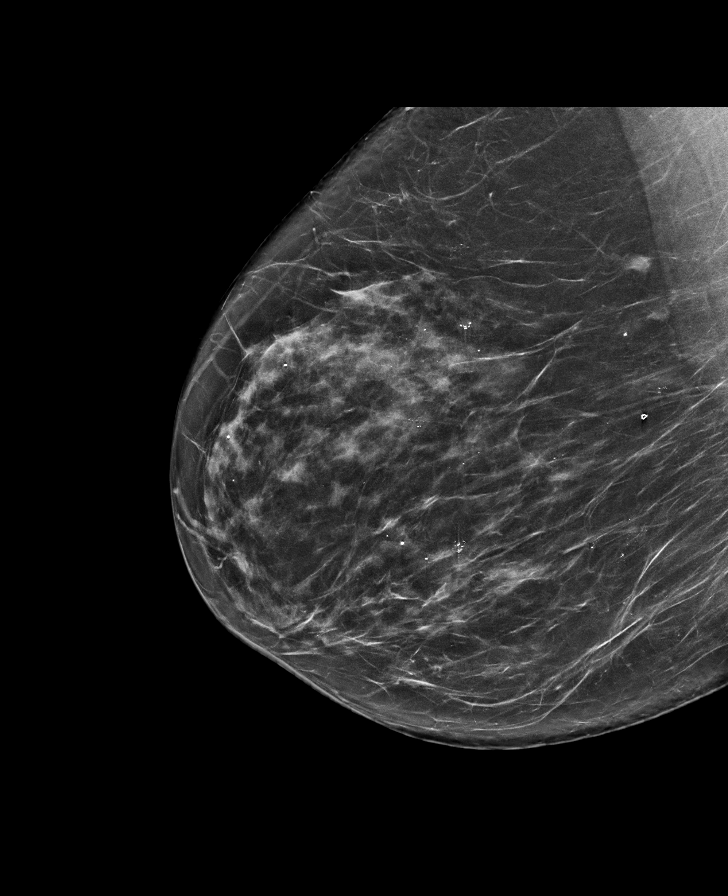

[L MLO synth-2D]
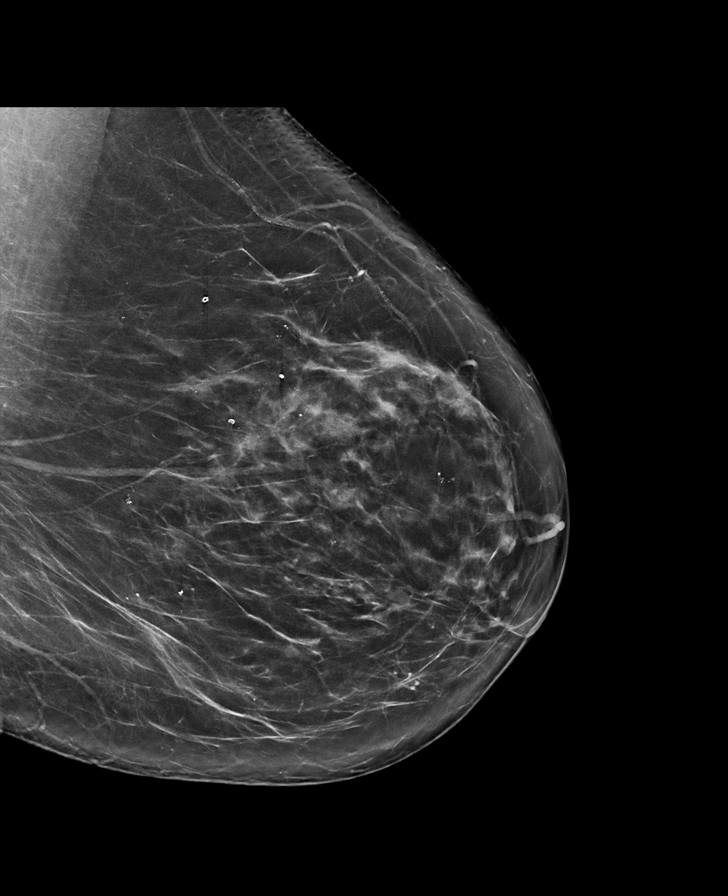

[L CC synth-2D]
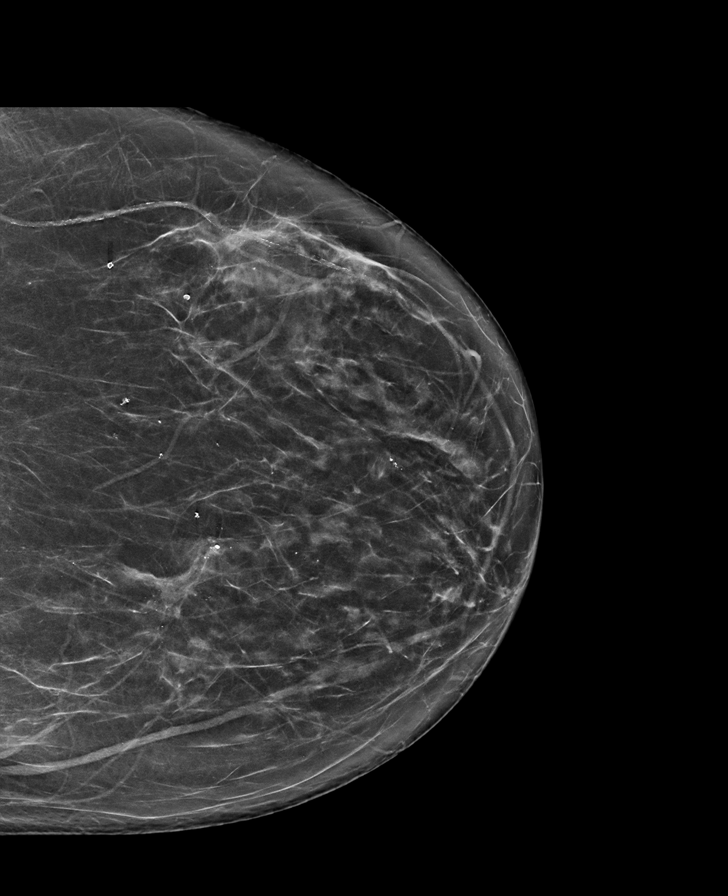

[R CC synth-2D]
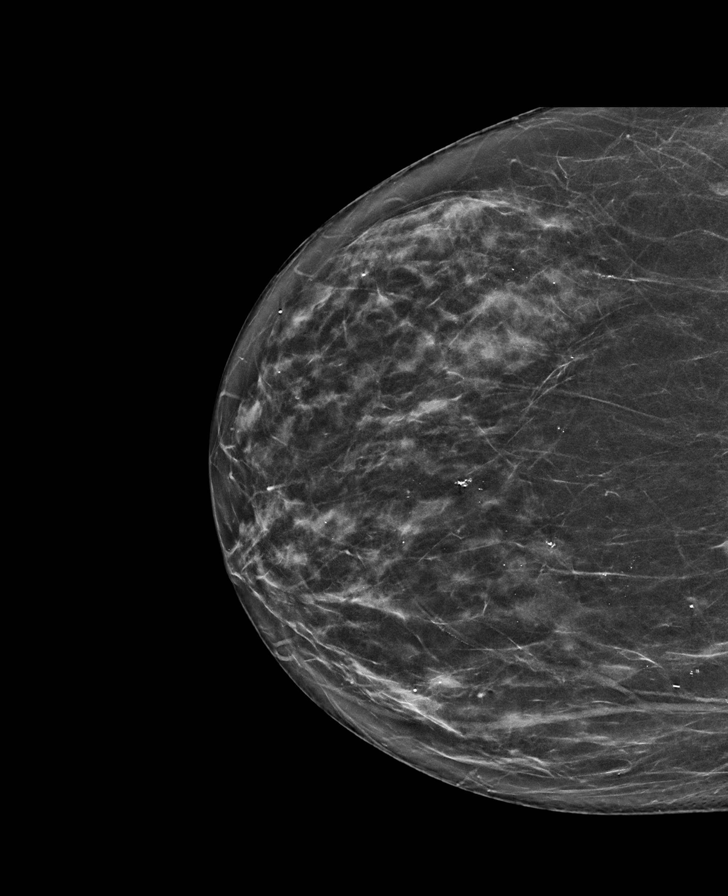

[L CC]
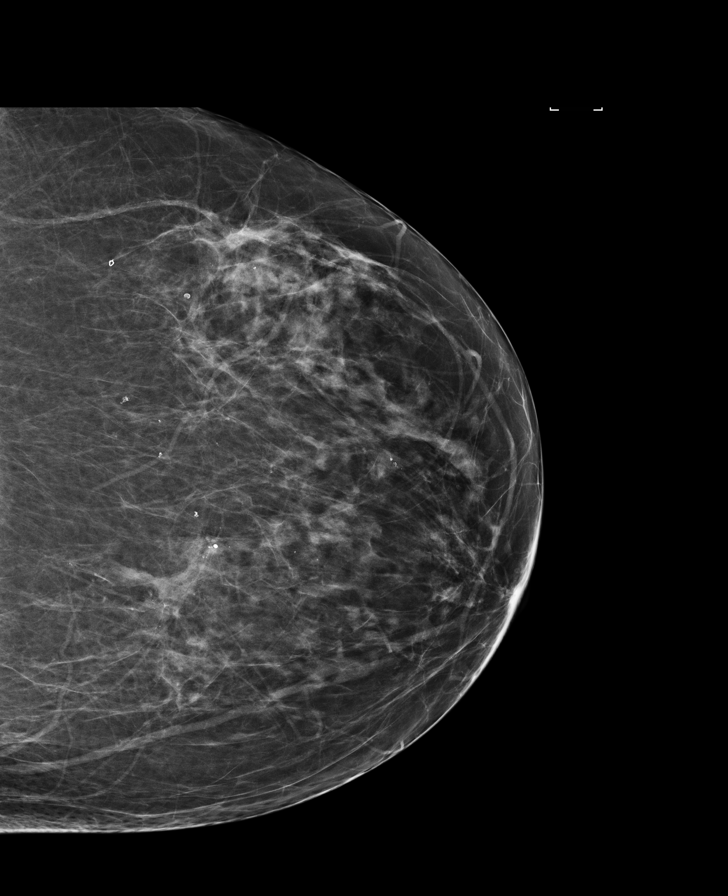

[R CC]
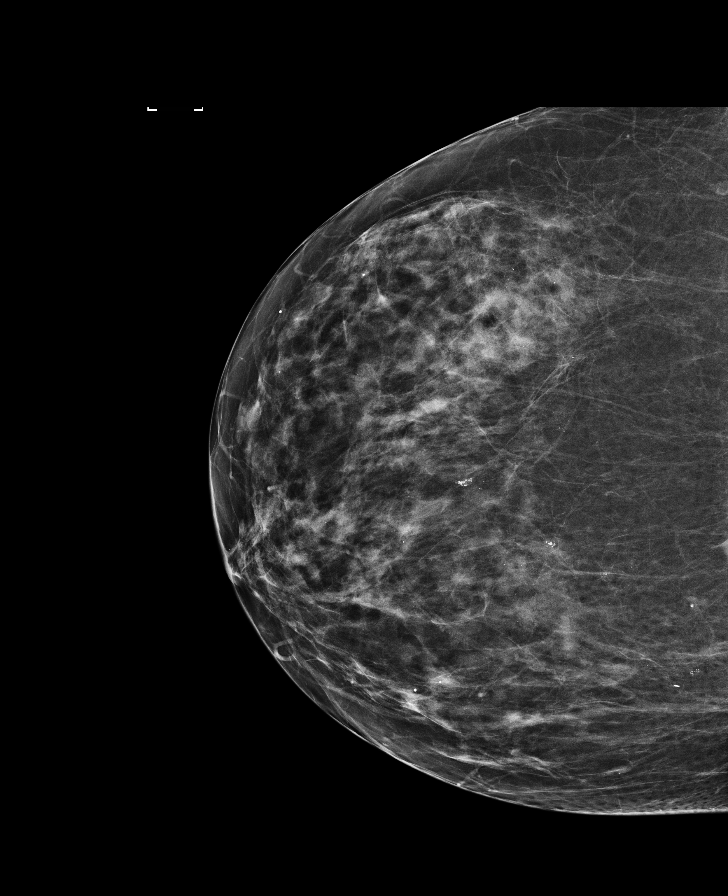

[8 of 29 positions shown; findings below may reference images not displayed]

ACR Breast Density Category c: The breast tissue is heterogeneously
dense, which may obscure small masses.
FINDINGS: In the right breast, a possible mass warrants further evaluation. In
the left breast, no findings suspicious for malignancy. Images were
processed with CAD.
IMPRESSION: Further evaluation is suggested for possible mass in the right
breast.

RECOMMENDATION:
Diagnostic mammogram and possibly ultrasound of the right breast.
(Code:[id number]-[id number])

The patient will be contacted regarding the findings, and additional
imaging will be scheduled.

BI-RADS CATEGORY  0: Incomplete. Need additional imaging evaluation
and/or prior mammograms for comparison.

## 2018-11-04 ENCOUNTER — Other Ambulatory Visit: Payer: Self-pay | Admitting: General Surgery

## 2018-11-04 DIAGNOSIS — Z853 Personal history of malignant neoplasm of breast: Secondary | ICD-10-CM

## 2018-11-21 NOTE — Progress Notes (Deleted)
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  INTERVAL HISTORY: Patient returns to clinic today for routine six-month follow-up.  She currently feels well and is asymptomatic. She has no neurologic complaints. She denies any recent fevers or illnesses. She has a good appetite and denies weight loss. She denies any pain. She has no chest pain or shortness of breath. She denies any nausea, vomiting, constipation, or diarrhea. She has no urinary complaints.  Patient feels at her baseline offers no specific complaints today.  REVIEW OF SYSTEMS:   Review of Systems  Constitutional: Negative.  Negative for fever, malaise/fatigue and weight loss.  Eyes: Negative.   Respiratory: Negative.  Negative for cough and shortness of breath.   Cardiovascular: Negative.  Negative for palpitations and leg swelling.  Gastrointestinal: Negative.  Negative for abdominal pain.  Genitourinary: Negative.  Negative for dysuria.  Musculoskeletal: Negative.  Negative for back pain.  Skin: Negative.  Negative for rash.  Neurological: Negative.  Negative for focal weakness, weakness and headaches.  Psychiatric/Behavioral: Negative.  The patient is not nervous/anxious.     As per HPI. Otherwise, a complete review of systems is negative.  PAST MEDICAL HISTORY: Past Medical History:  Diagnosis Date  . Borderline diabetes   . Breast cancer, right ([place]) 11/2016   invasive mammary carcinoma, Lumpectomy and rad tx's.   . Colon polyp   . Diabetes mellitus without complication ([place])    type 2  . Hyperlipemia   . Hypertension   . Hyperthyroidism   . Hypothyroidism   . Insomnia   . Osteopenia   . Personal history  of radiation therapy 2018   right breast cancer    PAST SURGICAL HISTORY: Past Surgical History:  Procedure Laterality Date  . ABDOMINAL HYSTERECTOMY     oophorectomy  . APPENDECTOMY    . BREAST BIOPSY Left    neg core  . BREAST BIOPSY Right    neg  core  . BREAST BIOPSY Right 11/2016   invasive mammary carcinoma, [place]  . BREAST BIOPSY Right 11/2016   benign, stero  . BREAST LUMPECTOMY Right 12/2016   invasive mammary carcinoma   . CATARACT EXTRACTION W/ INTRAOCULAR LENS IMPLANT    . CHOLECYSTECTOMY    . COLONOSCOPY    . COLONOSCOPY WITH PROPOFOL N/A 01/15/2018   Procedure: COLONOSCOPY WITH PROPOFOL;  Surgeon: Toledo, Benay Pike, MD;  Location: [facility];  Service: Gastroenterology;  Laterality: N/A;  . EYE SURGERY     bilateral cataracts  . MASTECTOMY     with sentinel nodes   . OOPHORECTOMY    . PARTIAL MASTECTOMY WITH NEEDLE LOCALIZATION Right 01/04/2017   Procedure: PARTIAL MASTECTOMY WITH NEEDLE LOCALIZATION;  Surgeon: Leonie Green, MD;  Location: [facility];  Service: General;  Laterality: Right;  . SENTINEL NODE BIOPSY Right 01/04/2017   Procedure: SENTINEL NODE BIOPSY;  Surgeon: Leonie Green, MD;  Location: [facility] OR[facility];  Service: General;  Laterality: Right;  . THYROID SURGERY     total thyroidectomy  . TONSILLECTOMY    . TOTAL THYROIDECTOMY      FAMILY HISTORY: Family History  Problem Relation Age of Onset  . Breast cancer Sister 4  . Lung cancer Sister   . Diabetes Sister   .  Breast cancer Maternal Aunt   . Diabetes Mother   . Diabetes Father   . Diabetes Brother   . Diabetes Sister   . Diabetes Sister   . Diabetes Brother     ADVANCED DIRECTIVES (Y/N):  N  HEALTH MAINTENANCE: Social History   Tobacco Use  . Smoking status: Never Smoker  . Smokeless tobacco: Never Used  Substance Use Topics  . Alcohol use: Yes    Comment: occassional  . Drug use: No     Colonoscopy:  PAP:  Bone density:  Lipid panel:  Allergies  Allergen  Reactions  . Levaquin [Levofloxacin] Rash  . Penicillins Rash    Has patient had a PCN reaction causing immediate rash, facial/tongue/throat swelling, SOB or lightheadedness with hypotension: No Has patient had a PCN reaction causing severe rash involving mucus membranes or skin necrosis: No Has patient had a PCN reaction that required hospitalization: No Has patient had a PCN reaction occurring within the last 10 years: No If all of the above answers are "NO", then may proceed with Cephalosporin use.   . Trazodone Other (See Comments)    Headache and nightmares    Current Outpatient Medications  Medication Sig Dispense Refill  . acetaminophen (TYLENOL) 500 MG tablet Take 250-500 mg by mouth every 6 (six) hours as needed for mild pain.    Marland Kitchen amLODipine (NORVASC) 5 MG tablet Take 5 mg by mouth every evening.    Marland Kitchen aspirin EC 81 MG tablet Take 81 mg by mouth daily.    . calcium-vitamin D (OSCAL WITH D) 500-200 MG-UNIT tablet Take 2 tablets by mouth daily.    . fluticasone (FLONASE) 50 MCG/ACT nasal spray Place 1 spray into both nostrils daily as needed for allergies or rhinitis.    . hydrochlorothiazide (HYDRODIURIL) 25 MG tablet Take 12.5 mg by mouth daily.     Marland Kitchen levothyroxine (SYNTHROID, LEVOTHROID) 50 MCG tablet Take 50 mcg by mouth daily before breakfast.    . lisinopril (PRINIVIL,ZESTRIL) 40 MG tablet Take 40 mg by mouth every evening.     . loperamide (IMODIUM A-D) 2 MG tablet Take 2 mg by mouth 4 (four) times daily as needed for diarrhea or loose stools.    . metoprolol tartrate (LOPRESSOR) 100 MG tablet Take 100 mg by mouth 2 (two) times daily.    . Multiple Vitamin (MULTIVITAMIN WITH MINERALS) TABS tablet Take 2 tablets by mouth daily.    . Omega-3 Fatty Acids (FISH OIL PO) Take 1 capsule by mouth daily.    Marland Kitchen omeprazole (PRILOSEC) 20 MG capsule Take 20 mg by mouth daily as needed (heartburn).    Marland Kitchen PARoxetine (PAXIL) 10 MG tablet Take 10 mg by mouth at bedtime.    . pravastatin  (PRAVACHOL) 10 MG tablet Take 10 mg by mouth every evening.    . Probiotic Product (PROBIOTIC DAILY PO) Take 1 capsule by mouth daily.    Marland Kitchen zolpidem (AMBIEN) 5 MG tablet Take 2.5 mg by mouth at bedtime as needed for sleep.     No current facility-administered medications for this visit.     OBJECTIVE: There were no vitals filed for this visit.   There is no height or weight on file to calculate BMI.    ECOG FS:0 - Asymptomatic  General: Well-developed, well-nourished, no acute distress. Eyes: Pink conjunctiva, anicteric sclera. HEENT: Normocephalic, moist mucous membranes. Breast: Exam performed by another provider earlier today. Lungs: Clear to auscultation bilaterally. Heart: Regular rate and rhythm. No rubs, murmurs, or gallops.  Abdomen: Soft, nontender, nondistended. No organomegaly noted, normoactive bowel sounds. Musculoskeletal: No edema, cyanosis, or clubbing. Neuro: Alert, answering all questions appropriately. Cranial nerves grossly intact. Skin: No rashes or petechiae noted. Psych: Normal affect.  LAB RESULTS:  Lab Results  Component Value Date   NA 132 (L) 01/07/2017   K 3.6 01/07/2017   CL 98 (L) 01/07/2017   CO2 22 01/07/2017   GLUCOSE 118 (H) 01/07/2017   BUN 19 01/07/2017   CREATININE 0.80 06/11/2018   CALCIUM 9.0 01/07/2017   PROT 7.6 01/07/2017   ALBUMIN 4.3 01/07/2017   AST 31 01/07/2017   ALT 20 01/07/2017   ALKPHOS 85 01/07/2017   BILITOT 0.5 01/07/2017   GFRNONAA 50 (L) 01/07/2017   GFRAA 58 (L) 01/07/2017    Lab Results  Component Value Date   WBC 5.0 03/22/2017   HGB 13.4 03/22/2017   HCT 39.4 03/22/2017   MCV 95.2 03/22/2017   PLT 227 03/22/2017     STUDIES: No results found.  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  PLAN:    1. 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 Although she has triple negative disease, given the small size of tumor of 6 mm and her advanced age, it was agreed upon that adjuvant chemotherapy would not be necessary. Plus, patient had stated she would decline chemotherapy anyway.  She completed adjuvant XRT in January 2019. Given the ER/PR negativity of her malignancy, an aromatase inhibitor would not offer any benefit.  Her most recent mammogram on November 21, 2017 was reported as BI-RADS 2.  Repeat in August 2020.  Patient has requested less frequent and ED visits, therefore she will see radiation oncology in 6 months and follow-up with medical oncology in 1 year.    I spent a total of 20 minutes face-to-face with the patient of which greater than 50% of the visit was spent in counseling and coordination of care as detailed above.   Patient expressed understanding and was in agreement with this plan. She also understands that She can call clinic at any time with any questions, concerns, or complaints.   Cancer Staging Primary cancer of upper inner quadrant of right female breast [facility]) Staging form: Breast, AJCC 8th Edition - Clinical stage from 01/02/2017: Stage IB (cT1b, cN0, cM0, G3, ER: Negative, PR: Negative, HER2: Negative) - Signed by Lloyd Huger, MD on 01/21/2017 - Pathologic stage from 01/23/2017: Stage IB (pT1b, pN0, cM0, G3, ER: Negative, PR: Negative, HER2: Negative) - Signed by Lloyd Huger, MD on 01/23/2017   Lloyd Huger, MD   11/21/2018 12:06 AM

## 2018-11-25 ENCOUNTER — Ambulatory Visit
Admission: RE | Admit: 2018-11-25 | Discharge: 2018-11-25 | Disposition: A | Discharge status: Home / Self Care | Payer: Medicare Other | Source: Ambulatory Visit | Attending: General Surgery | Admitting: General Surgery

## 2018-11-25 DIAGNOSIS — Z853 Personal history of malignant neoplasm of breast: Secondary | ICD-10-CM

## 2018-11-26 ENCOUNTER — Inpatient Hospital Stay: Payer: Medicare Other | Admitting: Oncology

## 2018-11-26 ENCOUNTER — Other Ambulatory Visit: Payer: Self-pay | Admitting: *Deleted

## 2018-11-26 ENCOUNTER — Ambulatory Visit: Payer: Medicare Other | Admitting: Oncology

## 2018-12-06 NOTE — Progress Notes (Signed)
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  INTERVAL HISTORY: Patient returns to clinic today for routine yearly evaluation.  She continues to feel well and remains asymptomatic. She has no neurologic complaints. She denies any recent fevers or illnesses. She has a good appetite and denies weight loss. She denies any pain.  She denies any chest pain, shortness of breath, cough, or hemoptysis.  She denies any nausea, vomiting, constipation, or diarrhea. She has no urinary complaints.  Patient feels at her baseline offers no specific complaints today.  REVIEW OF SYSTEMS:   Review of Systems  Constitutional: Negative.  Negative for fever, malaise/fatigue and weight loss.  Eyes: Negative.   Respiratory: Negative.  Negative for cough and shortness of breath.   Cardiovascular: Negative.  Negative for palpitations and leg swelling.  Gastrointestinal: Negative.  Negative for abdominal pain.  Genitourinary: Negative.  Negative for dysuria.  Musculoskeletal: Negative.  Negative for back pain.  Skin: Negative.  Negative for rash.  Neurological: Negative.  Negative for focal weakness, weakness and headaches.  Psychiatric/Behavioral: Negative.  The patient is not nervous/anxious.     As per HPI. Otherwise, a complete review of systems is negative.  PAST MEDICAL HISTORY: Past Medical History:  Diagnosis Date  . Borderline diabetes   . Breast cancer, right ([place]) 11/2016   invasive mammary carcinoma, Lumpectomy and rad tx's.   . Colon polyp   . Diabetes mellitus without complication ([place])    type 2  . Hyperlipemia   . Hypertension   . Hyperthyroidism   . Hypothyroidism   . Insomnia   .  Osteopenia   . Personal history of radiation therapy 2018   right breast cancer    PAST SURGICAL HISTORY: Past Surgical History:  Procedure Laterality Date  . ABDOMINAL HYSTERECTOMY     oophorectomy  . APPENDECTOMY    . BREAST BIOPSY Left    neg core  . BREAST BIOPSY Right    neg  core  . BREAST BIOPSY Right 11/2016   invasive mammary carcinoma, [place]  . BREAST BIOPSY Right 11/2016   benign, stero  . BREAST LUMPECTOMY Right 12/2016   invasive mammary carcinoma  Negative margins  . CATARACT EXTRACTION W/ INTRAOCULAR LENS IMPLANT    . CHOLECYSTECTOMY    . COLONOSCOPY    . COLONOSCOPY WITH PROPOFOL N/A 01/15/2018   Procedure: COLONOSCOPY WITH PROPOFOL;  Surgeon: Toledo, Benay Pike, MD;  Location: [facility];  Service: Gastroenterology;  Laterality: N/A;  . EYE SURGERY     bilateral cataracts  . OOPHORECTOMY    . PARTIAL MASTECTOMY WITH NEEDLE LOCALIZATION Right 01/04/2017   Procedure: PARTIAL MASTECTOMY WITH NEEDLE LOCALIZATION;  Surgeon: Leonie Green, MD;  Location: [facility];  Service: General;  Laterality: Right;  . SENTINEL NODE BIOPSY Right 01/04/2017   Procedure: SENTINEL NODE BIOPSY;  Surgeon: Leonie Green, MD;  Location: [facility] ORS;  Service: General;  Laterality: Right;  . THYROID SURGERY     total thyroidectomy  . TONSILLECTOMY    . TOTAL THYROIDECTOMY      FAMILY HISTORY: Family History  Problem Relation Age of Onset  . Breast cancer Sister 73  . Lung cancer Sister   . Diabetes Sister   . Breast cancer Maternal  Aunt   . Diabetes Mother   . Diabetes Father   . Diabetes Brother   . Diabetes Sister   . Diabetes Sister   . Diabetes Brother     ADVANCED DIRECTIVES (Y/N):  N  HEALTH MAINTENANCE: Social History   Tobacco Use  . Smoking status: Never Smoker  . Smokeless tobacco: Never Used  Substance Use Topics  . Alcohol use: Yes    Comment: occassional  . Drug use: No     Colonoscopy:  PAP:  Bone density:  Lipid panel:  Allergies   Allergen Reactions  . Levaquin [Levofloxacin] Rash  . Penicillins Rash    Has patient had a PCN reaction causing immediate rash, facial/tongue/throat swelling, SOB or lightheadedness with hypotension: No Has patient had a PCN reaction causing severe rash involving mucus membranes or skin necrosis: No Has patient had a PCN reaction that required hospitalization: No Has patient had a PCN reaction occurring within the last 10 years: No If all of the above answers are "NO", then may proceed with Cephalosporin use.   . Trazodone Other (See Comments)    Headache and nightmares    Current Outpatient Medications  Medication Sig Dispense Refill  . acetaminophen (TYLENOL) 500 MG tablet Take 250-500 mg by mouth every 6 (six) hours as needed for mild pain.    Marland Kitchen amLODipine (NORVASC) 5 MG tablet Take 5 mg by mouth every evening.    Marland Kitchen aspirin EC 81 MG tablet Take 81 mg by mouth daily.    . calcium-vitamin D (OSCAL WITH D) 500-200 MG-UNIT tablet Take 2 tablets by mouth daily.    . fluticasone (FLONASE) 50 MCG/ACT nasal spray Place 1 spray into both nostrils daily as needed for allergies or rhinitis.    . hydrochlorothiazide (HYDRODIURIL) 25 MG tablet Take 12.5 mg by mouth daily.     Marland Kitchen levothyroxine (SYNTHROID, LEVOTHROID) 50 MCG tablet Take 50 mcg by mouth daily before breakfast.    . lisinopril (PRINIVIL,ZESTRIL) 40 MG tablet Take 40 mg by mouth every evening.     . loperamide (IMODIUM A-D) 2 MG tablet Take 2 mg by mouth 4 (four) times daily as needed for diarrhea or loose stools.    . metoprolol tartrate (LOPRESSOR) 100 MG tablet Take 100 mg by mouth 2 (two) times daily.    . Multiple Vitamin (MULTIVITAMIN WITH MINERALS) TABS tablet Take 2 tablets by mouth daily.    . Omega-3 Fatty Acids (FISH OIL PO) Take 1 capsule by mouth daily.    Marland Kitchen omeprazole (PRILOSEC) 20 MG capsule Take 20 mg by mouth daily as needed (heartburn).    Marland Kitchen PARoxetine (PAXIL) 10 MG tablet Take 10 mg by mouth at bedtime.    .  pravastatin (PRAVACHOL) 10 MG tablet Take 10 mg by mouth every evening.    . Probiotic Product (PROBIOTIC DAILY PO) Take 1 capsule by mouth daily.    Marland Kitchen zolpidem (AMBIEN) 5 MG tablet Take 2.5 mg by mouth at bedtime as needed for sleep.     No current facility-administered medications for this visit.     OBJECTIVE: Vitals:   12/09/18 1119  BP: (!) 142/67  Pulse: (!) 56  Temp: 97.9 F (36.6 C)     Body mass index is 32.17 kg/m.    ECOG FS:0 - Asymptomatic  General: Well-developed, well-nourished, no acute distress. Eyes: Pink conjunctiva, anicteric sclera. HEENT: Normocephalic, moist mucous membranes. Breasts: Patient reports a normal exam by another provider recently. Lungs: Clear to auscultation bilaterally. Heart: Regular rate  and rhythm. No rubs, murmurs, or gallops. Abdomen: Soft, nontender, nondistended. No organomegaly noted, normoactive bowel sounds. Musculoskeletal: No edema, cyanosis, or clubbing. Neuro: Alert, answering all questions appropriately. Cranial nerves grossly intact. Skin: No rashes or petechiae noted. Psych: Normal affect.  LAB RESULTS:  Lab Results  Component Value Date   NA 132 (L) 01/07/2017   K 3.6 01/07/2017   CL 98 (L) 01/07/2017   CO2 22 01/07/2017   GLUCOSE 118 (H) 01/07/2017   BUN 19 01/07/2017   CREATININE 0.80 06/11/2018   CALCIUM 9.0 01/07/2017   PROT 7.6 01/07/2017   ALBUMIN 4.3 01/07/2017   AST 31 01/07/2017   ALT 20 01/07/2017   ALKPHOS 85 01/07/2017   BILITOT 0.5 01/07/2017   GFRNONAA 50 (L) 01/07/2017   GFRAA 58 (L) 01/07/2017    Lab Results  Component Value Date   WBC 5.0 03/22/2017   HGB 13.4 03/22/2017   HCT 39.4 03/22/2017   MCV 95.2 03/22/2017   PLT 227 03/22/2017     STUDIES: Mm Diag Breast Tomo Bilateral  Result Date: 11/25/2018 CLINICAL DATA:  78 year old female for annual follow-up. History of RIGHT breast cancer and lumpectomy in 2018. EXAM: DIGITAL DIAGNOSTIC BILATERAL MAMMOGRAM WITH CAD AND TOMO  COMPARISON:  Previous exam(s). ACR Breast Density Category b: There are scattered areas of fibroglandular density. FINDINGS: 2D and 3D full field views of both breasts and a magnification views of the lumpectomy site demonstrate no suspicious mass, nonsurgical distortion or worrisome calcifications. RIGHT lumpectomy changes and developing dystrophic calcifications are noted. Mammographic images were processed with CAD. IMPRESSION: No mammographic evidence of breast malignancy. RECOMMENDATION: Bilateral diagnostic mammogram in 1 year I have discussed the findings and recommendations with the patient. If applicable, a reminder letter will be sent to the patient regarding the next appointment. BI-RADS CATEGORY  2: Benign. Electronically Signed   By: Margarette Canada M.D.   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  PLAN:    1. 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 Although she has triple negative disease, given the small size of tumor of 6 mm and her advanced age, it was agreed upon that adjuvant chemotherapy would not be necessary. Plus, patient had stated she would decline chemotherapy anyway.  She completed adjuvant XRT in January 2019. Given the ER/PR negativity of her malignancy, an aromatase inhibitor would not offer any benefit.  Her most recent mammogram on November 25, 2018 was reported as BI-RADS 2.  Repeat in August 2021.  Patient continues to request less frequent follow-up, therefore she will return to clinic in 1 year for further evaluation.    I spent a total of 20 minutes face-to-face with the patient of which greater than 50% of the visit was spent in counseling and coordination of care as detailed above.   Patient expressed understanding and was in agreement with this plan. She also understands  that She can call clinic at any time with any questions, concerns, or complaints.   Cancer Staging Primary cancer of upper inner quadrant of right female breast [facility]) Staging form: Breast, AJCC 8th Edition - Clinical stage from 01/02/2017: Stage IB (cT1b, cN0, cM0, G3, ER: Negative, PR: Negative, HER2: Negative) - Signed by Lloyd Huger, MD on 01/21/2017 - Pathologic stage from 01/23/2017: Stage IB (pT1b, pN0, cM0, G3, ER: Negative,  PR: Negative, HER2: Negative) - Signed by Lloyd Huger, MD on 01/23/2017   Lloyd Huger, MD   12/09/2018 3:03 PM

## 2018-12-09 ENCOUNTER — Encounter: Payer: Self-pay | Admitting: Oncology

## 2018-12-09 ENCOUNTER — Inpatient Hospital Stay: Payer: Medicare Other | Attending: Oncology | Admitting: Oncology

## 2018-12-09 ENCOUNTER — Other Ambulatory Visit: Payer: Self-pay

## 2018-12-09 VITALS — BP 142/67 | HR 56 | Temp 97.9°F | Wt 162.0 lb

## 2018-12-09 DIAGNOSIS — C50211 Malignant neoplasm of upper-inner quadrant of right female breast: Secondary | ICD-10-CM

## 2018-12-09 DIAGNOSIS — G459 Transient cerebral ischemic attack, unspecified: Secondary | ICD-10-CM | POA: Diagnosis not present

## 2018-12-09 DIAGNOSIS — I639 Cerebral infarction, unspecified: Secondary | ICD-10-CM

## 2018-12-09 DIAGNOSIS — I6389 Other cerebral infarction: Secondary | ICD-10-CM | POA: Diagnosis not present

## 2018-12-09 HISTORY — DX: Cerebral infarction, unspecified: I63.9

## 2018-12-09 NOTE — Progress Notes (Signed)
Patient here today for follow up.  Patient denies any nausea, vomiting, diarrhea, constipation or pain.  No new breast concerns today, had breast exam by surgeon a few days ago.

## 2018-12-10 ENCOUNTER — Observation Stay: Payer: Medicare Other

## 2018-12-10 ENCOUNTER — Emergency Department: Payer: Medicare Other

## 2018-12-10 ENCOUNTER — Inpatient Hospital Stay (HOSPITAL_COMMUNITY)
Admit: 2018-12-10 | Discharge: 2018-12-10 | Disposition: A | Discharge status: Home / Self Care | Payer: Medicare Other | Attending: Family Medicine | Admitting: Family Medicine

## 2018-12-10 ENCOUNTER — Encounter: Payer: Self-pay | Admitting: Emergency Medicine

## 2018-12-10 ENCOUNTER — Inpatient Hospital Stay
Admission: EM | Admit: 2018-12-10 | Discharge: 2018-12-19 | DRG: 065 | Disposition: A | Discharge status: Rehabilitation Facility | Payer: Medicare Other | Attending: Internal Medicine | Admitting: Internal Medicine

## 2018-12-10 ENCOUNTER — Other Ambulatory Visit: Payer: Self-pay

## 2018-12-10 DIAGNOSIS — R29810 Facial weakness: Secondary | ICD-10-CM | POA: Diagnosis present

## 2018-12-10 DIAGNOSIS — S81011A Laceration without foreign body, right knee, initial encounter: Secondary | ICD-10-CM | POA: Diagnosis present

## 2018-12-10 DIAGNOSIS — Z885 Allergy status to narcotic agent status: Secondary | ICD-10-CM

## 2018-12-10 DIAGNOSIS — I6389 Other cerebral infarction: Principal | ICD-10-CM | POA: Diagnosis present

## 2018-12-10 DIAGNOSIS — E1136 Type 2 diabetes mellitus with diabetic cataract: Secondary | ICD-10-CM | POA: Diagnosis present

## 2018-12-10 DIAGNOSIS — G8191 Hemiplegia, unspecified affecting right dominant side: Secondary | ICD-10-CM | POA: Diagnosis present

## 2018-12-10 DIAGNOSIS — I361 Nonrheumatic tricuspid (valve) insufficiency: Secondary | ICD-10-CM

## 2018-12-10 DIAGNOSIS — E1151 Type 2 diabetes mellitus with diabetic peripheral angiopathy without gangrene: Secondary | ICD-10-CM | POA: Diagnosis present

## 2018-12-10 DIAGNOSIS — E890 Postprocedural hypothyroidism: Secondary | ICD-10-CM | POA: Diagnosis present

## 2018-12-10 DIAGNOSIS — W19XXXA Unspecified fall, initial encounter: Secondary | ICD-10-CM | POA: Diagnosis present

## 2018-12-10 DIAGNOSIS — E878 Other disorders of electrolyte and fluid balance, not elsewhere classified: Secondary | ICD-10-CM | POA: Diagnosis present

## 2018-12-10 DIAGNOSIS — Z880 Allergy status to penicillin: Secondary | ICD-10-CM

## 2018-12-10 DIAGNOSIS — R4781 Slurred speech: Secondary | ICD-10-CM | POA: Diagnosis not present

## 2018-12-10 DIAGNOSIS — Z23 Encounter for immunization: Secondary | ICD-10-CM

## 2018-12-10 DIAGNOSIS — Z90710 Acquired absence of both cervix and uterus: Secondary | ICD-10-CM | POA: Diagnosis not present

## 2018-12-10 DIAGNOSIS — I63332 Cerebral infarction due to thrombosis of left posterior cerebral artery: Secondary | ICD-10-CM

## 2018-12-10 DIAGNOSIS — M8580 Other specified disorders of bone density and structure, unspecified site: Secondary | ICD-10-CM | POA: Diagnosis present

## 2018-12-10 DIAGNOSIS — Z881 Allergy status to other antibiotic agents status: Secondary | ICD-10-CM | POA: Diagnosis not present

## 2018-12-10 DIAGNOSIS — R4702 Dysphasia: Secondary | ICD-10-CM | POA: Diagnosis present

## 2018-12-10 DIAGNOSIS — E785 Hyperlipidemia, unspecified: Secondary | ICD-10-CM | POA: Diagnosis present

## 2018-12-10 DIAGNOSIS — E0590 Thyrotoxicosis, unspecified without thyrotoxic crisis or storm: Secondary | ICD-10-CM | POA: Diagnosis present

## 2018-12-10 DIAGNOSIS — I10 Essential (primary) hypertension: Secondary | ICD-10-CM | POA: Diagnosis present

## 2018-12-10 DIAGNOSIS — D6859 Other primary thrombophilia: Secondary | ICD-10-CM | POA: Diagnosis present

## 2018-12-10 DIAGNOSIS — S45909A Unspecified injury of unspecified blood vessel at shoulder and upper arm level, unspecified arm, initial encounter: Secondary | ICD-10-CM

## 2018-12-10 DIAGNOSIS — R471 Dysarthria and anarthria: Secondary | ICD-10-CM | POA: Diagnosis present

## 2018-12-10 DIAGNOSIS — G459 Transient cerebral ischemic attack, unspecified: Secondary | ICD-10-CM | POA: Diagnosis present

## 2018-12-10 DIAGNOSIS — Z20828 Contact with and (suspected) exposure to other viral communicable diseases: Secondary | ICD-10-CM | POA: Diagnosis present

## 2018-12-10 DIAGNOSIS — Z923 Personal history of irradiation: Secondary | ICD-10-CM | POA: Diagnosis not present

## 2018-12-10 DIAGNOSIS — R29706 NIHSS score 6: Secondary | ICD-10-CM | POA: Diagnosis present

## 2018-12-10 DIAGNOSIS — Z86010 Personal history of colonic polyps: Secondary | ICD-10-CM | POA: Diagnosis not present

## 2018-12-10 DIAGNOSIS — Z9049 Acquired absence of other specified parts of digestive tract: Secondary | ICD-10-CM | POA: Diagnosis not present

## 2018-12-10 DIAGNOSIS — Z801 Family history of malignant neoplasm of trachea, bronchus and lung: Secondary | ICD-10-CM

## 2018-12-10 DIAGNOSIS — Z803 Family history of malignant neoplasm of breast: Secondary | ICD-10-CM

## 2018-12-10 DIAGNOSIS — Q670 Congenital facial asymmetry: Secondary | ICD-10-CM

## 2018-12-10 DIAGNOSIS — Z79890 Hormone replacement therapy (postmenopausal): Secondary | ICD-10-CM

## 2018-12-10 DIAGNOSIS — M7989 Other specified soft tissue disorders: Secondary | ICD-10-CM | POA: Diagnosis present

## 2018-12-10 DIAGNOSIS — Z853 Personal history of malignant neoplasm of breast: Secondary | ICD-10-CM | POA: Diagnosis not present

## 2018-12-10 DIAGNOSIS — E871 Hypo-osmolality and hyponatremia: Secondary | ICD-10-CM | POA: Diagnosis present

## 2018-12-10 DIAGNOSIS — R4701 Aphasia: Secondary | ICD-10-CM | POA: Diagnosis present

## 2018-12-10 DIAGNOSIS — Z79899 Other long term (current) drug therapy: Secondary | ICD-10-CM

## 2018-12-10 DIAGNOSIS — K224 Dyskinesia of esophagus: Secondary | ICD-10-CM | POA: Diagnosis present

## 2018-12-10 DIAGNOSIS — Z833 Family history of diabetes mellitus: Secondary | ICD-10-CM

## 2018-12-10 DIAGNOSIS — Z7982 Long term (current) use of aspirin: Secondary | ICD-10-CM

## 2018-12-10 LAB — ECHOCARDIOGRAM COMPLETE
Height: 60 in
Weight: 2589.08 oz

## 2018-12-10 LAB — LIPID PANEL
Cholesterol: 193 mg/dL (ref 0–200)
HDL: 65 mg/dL (ref >40)
LDL Cholesterol: 102 mg/dL — ABNORMAL HIGH (ref 0–99)
Total CHOL/HDL Ratio: 3 RATIO
Triglycerides: 131 mg/dL (ref <150)
VLDL: 26 mg/dL (ref 0–40)

## 2018-12-10 LAB — COMPREHENSIVE METABOLIC PANEL
ALT: 21 U/L (ref 0–44)
AST: 31 U/L (ref 15–41)
Albumin: 4.3 g/dL (ref 3.5–5.0)
Alkaline Phosphatase: 76 U/L (ref 38–126)
Anion gap: 13 (ref 5–15)
BUN: 15 mg/dL (ref 8–23)
CO2: 23 mmol/L (ref 22–32)
Calcium: 9.3 mg/dL (ref 8.9–10.3)
Chloride: 96 mmol/L — ABNORMAL LOW (ref 98–111)
Creatinine, Ser: 0.89 mg/dL (ref 0.44–1.00)
GFR calc Af Amer: >60 mL/min (ref >60)
GFR calc non Af Amer: >60 mL/min (ref >60)
Glucose, Bld: 133 mg/dL — ABNORMAL HIGH (ref 70–99)
Potassium: 3.7 mmol/L (ref 3.5–5.1)
Sodium: 132 mmol/L — ABNORMAL LOW (ref 135–145)
Total Bilirubin: 0.6 mg/dL (ref 0.3–1.2)
Total Protein: 7.4 g/dL (ref 6.5–8.1)

## 2018-12-10 LAB — DIFFERENTIAL
Abs Immature Granulocytes: 0.05 10*3/uL (ref 0.00–0.07)
Basophils Absolute: 0.1 10*3/uL (ref 0.0–0.1)
Basophils Relative: 1 %
Eosinophils Absolute: 0.2 10*3/uL (ref 0.0–0.5)
Eosinophils Relative: 2 %
Immature Granulocytes: 1 %
Lymphocytes Relative: 15 %
Lymphs Abs: 1.4 10*3/uL (ref 0.7–4.0)
Monocytes Absolute: 1.1 10*3/uL — ABNORMAL HIGH (ref 0.1–1.0)
Monocytes Relative: 12 %
Neutro Abs: 6.6 10*3/uL (ref 1.7–7.7)
Neutrophils Relative %: 69 %

## 2018-12-10 LAB — PROTIME-INR
INR: 1 (ref 0.8–1.2)
Prothrombin Time: 12.6 seconds (ref 11.4–15.2)

## 2018-12-10 LAB — HEMOGLOBIN A1C
Hgb A1c MFr Bld: 6.1 % — ABNORMAL HIGH (ref 4.8–5.6)
Mean Plasma Glucose: 128.37 mg/dL

## 2018-12-10 LAB — SARS CORONAVIRUS 2 BY RT PCR (HOSPITAL ORDER, PERFORMED IN ~~LOC~~ HOSPITAL LAB): SARS Coronavirus 2: NEGATIVE

## 2018-12-10 LAB — APTT: aPTT: 26 seconds (ref 24–36)

## 2018-12-10 LAB — TSH: TSH: 8.096 u[IU]/mL — ABNORMAL HIGH (ref 0.350–4.500)

## 2018-12-10 MED ORDER — PANTOPRAZOLE SODIUM 40 MG PO TBEC
40.0000 mg | DELAYED_RELEASE_TABLET | Freq: Every day | ORAL | Status: DC
  Administered 2018-12-12 – 2018-12-19 (×8): 40 mg via ORAL
  Filled 2018-12-10 (×8): qty 1

## 2018-12-10 MED ORDER — ACETAMINOPHEN 650 MG RE SUPP: 650.0000 mg | RECTAL | Status: DC | PRN

## 2018-12-10 MED ORDER — FLUTICASONE PROPIONATE 50 MCG/ACT NA SUSP
1.0000 | Freq: Every day | NASAL | Status: DC | PRN
  Filled 2018-12-10: qty 16

## 2018-12-10 MED ORDER — ONDANSETRON HCL 4 MG/2ML IJ SOLN: 4.0000 mg | INTRAMUSCULAR | Status: DC | PRN

## 2018-12-10 MED ORDER — IOHEXOL 350 MG/ML SOLN
100.0000 mL | Freq: Once | INTRAVENOUS | Status: AC | PRN
  Administered 2018-12-10: 100 mL via INTRAVENOUS

## 2018-12-10 MED ORDER — TRAZODONE HCL 50 MG PO TABS: 25.0000 mg | ORAL_TABLET | Freq: Every evening | ORAL | Status: DC | PRN

## 2018-12-10 MED ORDER — ZOLPIDEM TARTRATE 5 MG PO TABS: 2.5000 mg | ORAL_TABLET | Freq: Every evening | ORAL | Status: DC | PRN

## 2018-12-10 MED ORDER — SENNOSIDES-DOCUSATE SODIUM 8.6-50 MG PO TABS: 1.0000 | ORAL_TABLET | Freq: Every evening | ORAL | Status: DC | PRN

## 2018-12-10 MED ORDER — PAROXETINE HCL 10 MG PO TABS
10.0000 mg | ORAL_TABLET | Freq: Every day | ORAL | Status: DC
  Administered 2018-12-12 – 2018-12-18 (×7): 10 mg via ORAL
  Filled 2018-12-10 (×10): qty 1

## 2018-12-10 MED ORDER — SODIUM CHLORIDE 0.9% FLUSH
3.0000 mL | Freq: Once | INTRAVENOUS | Status: DC
Start: 2018-12-10 — End: 2018-12-19

## 2018-12-10 MED ORDER — STROKE: EARLY STAGES OF RECOVERY BOOK
Freq: Once | Status: AC
  Administered 2018-12-12: 21:00:00

## 2018-12-10 MED ORDER — CALCIUM CARBONATE-VITAMIN D 500-200 MG-UNIT PO TABS
2.0000 | ORAL_TABLET | Freq: Every day | ORAL | Status: DC
  Administered 2018-12-12: 2 via ORAL
  Filled 2018-12-10: qty 2

## 2018-12-10 MED ORDER — SODIUM CHLORIDE 0.9 % IV SOLN
INTRAVENOUS | Status: DC
  Administered 2018-12-10 – 2018-12-13 (×6): via INTRAVENOUS

## 2018-12-10 MED ORDER — ASPIRIN 81 MG PO CHEW
324.0000 mg | CHEWABLE_TABLET | Freq: Once | ORAL | Status: AC
  Administered 2018-12-10: 324 mg via ORAL
  Filled 2018-12-10: qty 4

## 2018-12-10 MED ORDER — OMEGA-3-ACID ETHYL ESTERS 1 G PO CAPS
1.0000 g | ORAL_CAPSULE | Freq: Every day | ORAL | Status: DC
  Administered 2018-12-12: 11:00:00 1 g via ORAL
  Filled 2018-12-10: qty 1

## 2018-12-10 MED ORDER — TETANUS-DIPHTH-ACELL PERTUSSIS 5-2.5-18.5 LF-MCG/0.5 IM SUSP
0.5000 mL | Freq: Once | INTRAMUSCULAR | Status: DC
  Filled 2018-12-10: qty 0.5

## 2018-12-10 MED ORDER — PRAVASTATIN SODIUM 20 MG PO TABS
10.0000 mg | ORAL_TABLET | Freq: Every evening | ORAL | Status: DC
  Administered 2018-12-12 – 2018-12-15 (×4): 10 mg via ORAL
  Filled 2018-12-10 (×6): qty 1

## 2018-12-10 MED ORDER — LORAZEPAM 2 MG/ML IJ SOLN
1.0000 mg | Freq: Once | INTRAMUSCULAR | Status: AC | PRN
  Administered 2018-12-10: 1 mg via INTRAVENOUS
  Filled 2018-12-10: qty 1

## 2018-12-10 MED ORDER — LEVOTHYROXINE SODIUM 50 MCG PO TABS: 50.0000 ug | ORAL_TABLET | Freq: Every day | ORAL | Status: DC

## 2018-12-10 MED ORDER — AMLODIPINE BESYLATE 5 MG PO TABS
5.0000 mg | ORAL_TABLET | Freq: Every evening | ORAL | Status: DC
  Administered 2018-12-12 – 2018-12-17 (×5): 5 mg via ORAL
  Filled 2018-12-10 (×5): qty 1

## 2018-12-10 MED ORDER — ASPIRIN 81 MG PO CHEW
81.0000 mg | CHEWABLE_TABLET | Freq: Every day | ORAL | Status: DC
  Administered 2018-12-12: 81 mg via ORAL
  Filled 2018-12-10: qty 1

## 2018-12-10 MED ORDER — ENOXAPARIN SODIUM 40 MG/0.4ML ~~LOC~~ SOLN
40.0000 mg | Freq: Every day | SUBCUTANEOUS | Status: DC
  Administered 2018-12-10 – 2018-12-18 (×10): 40 mg via SUBCUTANEOUS
  Filled 2018-12-10 (×10): qty 0.4

## 2018-12-10 MED ORDER — HYDROCHLOROTHIAZIDE 25 MG PO TABS
12.5000 mg | ORAL_TABLET | Freq: Every day | ORAL | Status: DC
  Administered 2018-12-12 – 2018-12-17 (×6): 12.5 mg via ORAL
  Filled 2018-12-10 (×6): qty 1

## 2018-12-10 MED ORDER — ADULT MULTIVITAMIN W/MINERALS CH
2.0000 | ORAL_TABLET | Freq: Every day | ORAL | Status: DC
  Administered 2018-12-12: 2 via ORAL
  Filled 2018-12-10: qty 2

## 2018-12-10 MED ORDER — LISINOPRIL 20 MG PO TABS
40.0000 mg | ORAL_TABLET | Freq: Every evening | ORAL | Status: DC
  Administered 2018-12-12 – 2018-12-17 (×5): 40 mg via ORAL
  Filled 2018-12-10 (×5): qty 2

## 2018-12-10 MED ORDER — ACETAMINOPHEN 160 MG/5ML PO SOLN
650.0000 mg | ORAL | Status: DC | PRN
  Filled 2018-12-10: qty 20.3

## 2018-12-10 MED ORDER — METOPROLOL TARTRATE 50 MG PO TABS
100.0000 mg | ORAL_TABLET | Freq: Two times a day (BID) | ORAL | Status: DC
  Administered 2018-12-12 – 2018-12-19 (×15): 100 mg via ORAL
  Filled 2018-12-10 (×16): qty 2

## 2018-12-10 MED ORDER — RISAQUAD PO CAPS
ORAL_CAPSULE | Freq: Every day | ORAL | Status: DC
  Administered 2018-12-12 – 2018-12-19 (×8): 1 via ORAL
  Filled 2018-12-10 (×8): qty 1

## 2018-12-10 MED ORDER — CLOPIDOGREL BISULFATE 75 MG PO TABS: 75.0000 mg | ORAL_TABLET | Freq: Every day | ORAL | Status: DC

## 2018-12-10 MED ORDER — ASPIRIN EC 81 MG PO TBEC: 81.0000 mg | DELAYED_RELEASE_TABLET | Freq: Every day | ORAL | Status: DC

## 2018-12-10 MED ORDER — LEVOTHYROXINE SODIUM 50 MCG PO TABS
75.0000 ug | ORAL_TABLET | Freq: Every day | ORAL | Status: DC
  Administered 2018-12-13 – 2018-12-19 (×7): 75 ug via ORAL
  Filled 2018-12-10 (×8): qty 1

## 2018-12-10 MED ORDER — ACETAMINOPHEN 325 MG PO TABS: 650.0000 mg | ORAL_TABLET | ORAL | Status: DC | PRN

## 2018-12-10 NOTE — ED Notes (Signed)
Attempted to call report to floor, nurse unavailable and will call back

## 2018-12-10 NOTE — ED Provider Notes (Signed)
[facility] Emergency Department Provider Note  ____________________________________________   First MD Initiated Contact with Patient 12/10/18 0017     (approximate)  I have reviewed the triage vital signs and the nursing notes.   HISTORY  Chief Complaint Code Stroke    HPI Flavia Mccarver is a 78 y.o. female with diabetes, prior breast cancer, hypertension, hyperlipidemia who presents with slurred speech.  Patient says she developed slurred speech around 4 PM on 12/09/2018.  She noted it while she was talking to her friend on the phone.  She then had a fall around 2130 or her right knee gave out when she was in the closet.  She is unclear exactly how she fell.  She does not think she hit her head.  She denies any pain currently.  She says she is only having some difficulty with thinking of the words but is mostly improved from prior.  Difficulty with speech onset 4 PM, nothing made it better, gradually better on its own, nothing makes it worse, constant          Past Medical History:  Diagnosis Date  . Borderline diabetes   . Breast cancer, right ([place]) 11/2016   invasive mammary carcinoma, Lumpectomy and rad tx's.   . Colon polyp   . Diabetes mellitus without complication ([place])    type 2  . Hyperlipemia   . Hypertension   . Hyperthyroidism   . Hypothyroidism   . Insomnia   . Osteopenia   . Personal history of radiation therapy 2018   right breast cancer    Patient Active Problem List   Diagnosis Date Noted  . Primary cancer of upper inner quadrant of right female breast ([place]) 12/25/2016    Past Surgical History:  Procedure Laterality Date  . ABDOMINAL HYSTERECTOMY     oophorectomy  . APPENDECTOMY    . BREAST BIOPSY Left    neg core  . BREAST BIOPSY Right    neg  core  . BREAST BIOPSY Right 11/2016   invasive mammary carcinoma, [place]  . BREAST BIOPSY Right 11/2016   benign, stero  . BREAST LUMPECTOMY Right 12/2016   invasive  mammary carcinoma  Negative margins  . CATARACT EXTRACTION W/ INTRAOCULAR LENS IMPLANT    . CHOLECYSTECTOMY    . COLONOSCOPY    . COLONOSCOPY WITH PROPOFOL N/A 01/15/2018   Procedure: COLONOSCOPY WITH PROPOFOL;  Surgeon: Toledo, Benay Pike, MD;  Location: [facility] ENDOSCOPY;  Service: Gastroenterology;  Laterality: N/A;  . EYE SURGERY     bilateral cataracts  . OOPHORECTOMY    . PARTIAL MASTECTOMY WITH NEEDLE LOCALIZATION Right 01/04/2017   Procedure: PARTIAL MASTECTOMY WITH NEEDLE LOCALIZATION;  Surgeon: Leonie Green, MD;  Location: [facility] ORS;  Service: General;  Laterality: Right;  . SENTINEL NODE BIOPSY Right 01/04/2017   Procedure: SENTINEL NODE BIOPSY;  Surgeon: Leonie Green, MD;  Location: [facility] ORS;  Service: General;  Laterality: Right;  . THYROID SURGERY     total thyroidectomy  . TONSILLECTOMY    . TOTAL THYROIDECTOMY      Prior to Admission medications   Medication Sig Start Date End Date Taking? Authorizing Provider  acetaminophen (TYLENOL) 500 MG tablet Take 250-500 mg by mouth every 6 (six) hours as needed for mild pain.    [provider]  amLODipine (NORVASC) 5 MG tablet Take 5 mg by mouth every evening. 09/20/16   [provider]  aspirin EC 81 MG tablet Take 81 mg by mouth  daily.    [provider]  calcium-vitamin D (OSCAL WITH D) 500-200 MG-UNIT tablet Take 2 tablets by mouth daily.    [provider]  fluticasone (FLONASE) 50 MCG/ACT nasal spray Place 1 spray into both nostrils daily as needed for allergies or rhinitis.    [provider]  hydrochlorothiazide (HYDRODIURIL) 25 MG tablet Take 12.5 mg by mouth daily.  09/20/16   [provider]  levothyroxine (SYNTHROID, LEVOTHROID) 50 MCG tablet Take 50 mcg by mouth daily before breakfast. 11/26/16   [provider]  lisinopril (PRINIVIL,ZESTRIL) 40 MG tablet Take 40 mg by mouth every evening.  11/03/16   [provider]  loperamide (IMODIUM A-D)  2 MG tablet Take 2 mg by mouth 4 (four) times daily as needed for diarrhea or loose stools.    [provider]  metoprolol tartrate (LOPRESSOR) 100 MG tablet Take 100 mg by mouth 2 (two) times daily. 09/14/16   [provider]  Multiple Vitamin (MULTIVITAMIN WITH MINERALS) TABS tablet Take 2 tablets by mouth daily.    [provider]  Omega-3 Fatty Acids (FISH OIL PO) Take 1 capsule by mouth daily.    [provider]  omeprazole (PRILOSEC) 20 MG capsule Take 20 mg by mouth daily as needed (heartburn).    [provider]  PARoxetine (PAXIL) 10 MG tablet Take 10 mg by mouth at bedtime. 09/20/16   [provider]  pravastatin (PRAVACHOL) 10 MG tablet Take 10 mg by mouth every evening. 11/03/16   [provider]  Probiotic Product (PROBIOTIC DAILY PO) Take 1 capsule by mouth daily.    [provider]  zolpidem (AMBIEN) 5 MG tablet Take 2.5 mg by mouth at bedtime as needed for sleep. 11/20/16   [provider]    Allergies Levaquin [levofloxacin], Penicillins, and Trazodone  Family History  Problem Relation Age of Onset  . Breast cancer Sister 22  . Lung cancer Sister   . Diabetes Sister   . Breast cancer Maternal Aunt   . Diabetes Mother   . Diabetes Father   . Diabetes Brother   . Diabetes Sister   . Diabetes Sister   . Diabetes Brother     Social History Social History   Tobacco Use  . Smoking status: Never Smoker  . Smokeless tobacco: Never Used  Substance Use Topics  . Alcohol use: Yes    Comment: occassional  . Drug use: No      Review of Systems Constitutional: No fever/chills Eyes: No visual changes. ENT: No sore throat. Cardiovascular: Denies chest pain. Respiratory: Denies shortness of breath. Gastrointestinal: No abdominal pain.  No nausea, no vomiting.  No diarrhea.  No constipation. Genitourinary: Negative for dysuria. Musculoskeletal: Negative for back pain. Skin: Negative for  rash. Neurological: Positive for slurred speech and fall All other ROS negative ____________________________________________   PHYSICAL EXAM:  VITAL SIGNS: ED Triage Vitals [12/10/18 0010]  Enc Vitals Group     BP (!) 163/80     Pulse Rate 77     Resp 16     Temp 97.8 F (36.6 C)     Temp Source Oral     SpO2 98 %     Weight      Height      Head Circumference      Peak Flow      Pain Score      Pain Loc      Pain Edu?      Excl. in  GC?     Constitutional: Alert and oriented. GCS 15  Eyes: Conjunctivae are normal. EOMI. Head: Atraumatic. Nose: No congestion/rhinnorhea. Mouth/Throat: Mucous membranes are moist.   Neck: No stridor. Trachea Midline. FROM Cardiovascular: Normal rate, regular rhythm. Grossly normal heart sounds.  Good peripheral circulation. No chest wall tenderness Respiratory: Normal respiratory effort.  No retractions. Lungs CTAB. Gastrointestinal: Soft and nontender. No distention. No abdominal bruits.  Musculoskeletal:   RUE: No point tenderness, deformity or other signs of injury. Radial pulse intact. Neuro intact. Full ROM in joint. LUE: No point tenderness, deformity or other signs of injury. Radial pulse intact. Neuro intact. Full ROM in joints RLE: small abrasion, No point tenderness, deformity or other signs of injury. DP pulse intact. Neuro intact. Full ROM in joints. LLE: No point tenderness, deformity or other signs of injury. DP pulse intact. Neuro intact. Full ROM in joints. Neurologic:  Slight right nasolabial flattening, some word finding difficulties, mild right pronator drift. NIHSS 6 Skin:  Skin is warm, dry and intact. No rash noted. Psychiatric: Mood and affect are normal. Speech and behavior are normal. GU: Deferred   ____________________________________________   LABS (all labs ordered are listed, but only abnormal results are displayed)  Labs Reviewed  CBC - Abnormal; Notable for the following components:      Result Value    HCT 50.5 (*)    MCV 118.5 (*)    MCHC 27.3 (*)    All other components within normal limits  DIFFERENTIAL - Abnormal; Notable for the following components:   Monocytes Absolute 1.1 (*)    All other components within normal limits  COMPREHENSIVE METABOLIC PANEL - Abnormal; Notable for the following components:   Sodium 132 (*)    Chloride 96 (*)    Glucose, Bld 133 (*)    All other components within normal limits  SARS CORONAVIRUS 2 (HOSPITAL ORDER, [place] [facility])  PROTIME-INR  APTT   ____________________________________________   ED ECG REPORT I, Vanessa Crucible, the attending physician, personally viewed and interpreted this ECG.  EKG is sinus rate of 76, no ST elevation, no T wave inversion, normal intervals ____________________________________________  RADIOLOGY   Official radiology report(s): Ct Angio Head W Or Wo Contrast  Result Date: 12/10/2018 CLINICAL DATA:  Initial evaluation for acute stroke. Slurred speech. EXAM: CT ANGIOGRAPHY HEAD AND NECK CT PERFUSION BRAIN TECHNIQUE: Multidetector CT imaging of the head and neck was performed using the standard protocol during bolus administration of intravenous contrast. Multiplanar CT image reconstructions and MIPs were obtained to evaluate the vascular anatomy. Carotid stenosis measurements (when applicable) are obtained utilizing NASCET criteria, using the distal internal carotid diameter as the denominator. Multiphase CT imaging of the brain was performed following IV bolus contrast injection. Subsequent parametric perfusion maps were calculated using RAPID software. CONTRAST:  168mL OMNIPAQUE IOHEXOL 350 MG/ML SOLN COMPARISON:  Prior CT from earlier the same day. FINDINGS: CTA NECK FINDINGS Aortic arch: Visualized aortic arch normal in caliber with normal 3 vessel morphology. Mild atheromatous plaque along the undersurface of the arch and about the origin of the great vessels without hemodynamically significant  stenosis. Visualized subclavian arteries widely patent. Right carotid system: Right CCA patent from its origin to the bifurcation without stenosis. Mild calcified plaque about the right bifurcation/proximal right ICA without hemodynamically significant stenosis. Right ICA widely patent distally to the skull base without stenosis, dissection or occlusion. Left carotid system: Left CCA patent from its origin to the bifurcation without stenosis. Bulky  calcified plaque about the left bifurcation/proximal left ICA with associated mild stenosis of up to approximately 35% by NASCET criteria. Left ICA widely patent distally to the skull base without stenosis, dissection, or occlusion. Vertebral arteries: Both vertebral arteries arise from the subclavian arteries. Right vertebral artery dominant. Vertebral arteries widely patent within the neck without stenosis, dissection, or occlusion. Skeleton: No acute osseous finding. No discrete lytic or blastic osseous lesions. Moderate cervical spondylolysis noted at C3-4 through C6-7. Other neck: No other acute soft tissue abnormality within the neck. Thyroid appears to be absent. Upper chest: Visualized upper chest demonstrates no acute finding. Scattered pleuroparenchymal thickening with linear scarring noted within the right greater than left upper lobes. Review of the MIP images confirms the above findings CTA HEAD FINDINGS Anterior circulation: Petrous segments widely patent bilaterally. Mild atheromatous plaque within the cavernous/supraclinoid ICAs without hemodynamically significant stenosis. A1 segments widely patent. Normal anterior communicating artery. Anterior cerebral arteries widely patent to their distal aspects without stenosis. No M1 stenosis or occlusion. Normal MCA bifurcations. Distal MCA branches well perfused and symmetric. Posterior circulation: Vertebral arteries widely patent to the vertebrobasilar junction without stenosis. Right vertebral artery dominant.  Posteroinferior cerebral arteries patent bilaterally. 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 Basilar widely patent to its distal aspect without stenosis. Superior cerebellar and posterior cerebral arteries widely patent bilaterally. Venous sinuses: Patent. Anatomic variants: None significant. Review of the MIP images confirms the above findings CT Brain Perfusion Findings: ASPECTS: 10 CBF (<30%) Volume: 19mL Perfusion (Tmax>6.0s) volume: 27mL Mismatch Volume: 1mL Infarction Location:Negative CT perfusion with no evidence for core infarct or perfusion abnormality. IMPRESSION: 1. Negative CTA of the head and neck with no evidence for large vessel occlusion. 2. Negative CT perfusion, with no evidence for acute core infarct or perfusion abnormality. 3. Mild atherosclerotic change about the left carotid bifurcation with associated mild 35% stenosis. 4. No other hemodynamically significant or correctable stenosis seen about the major arterial vasculature of the head and neck. A telephone call to the ordering clinician, Dr. Ruffin Frederick was made at 2:45 a.m. on 12/10/2018. A message was left with the clinician conveying these results. Electronically Signed   By: Jeannine Boga M.D.   On: 12/10/2018 02:51   Ct Angio Neck W Or Wo Contrast  Result Date: 12/10/2018 CLINICAL DATA:  Initial evaluation for acute stroke. Slurred speech. EXAM: CT ANGIOGRAPHY HEAD AND NECK CT PERFUSION BRAIN TECHNIQUE: Multidetector CT imaging of the head and neck was performed using the standard protocol during bolus administration of intravenous contrast. Multiplanar CT image reconstructions and MIPs were obtained to evaluate the vascular anatomy. Carotid stenosis measurements (when applicable) are obtained utilizing NASCET criteria, using the distal internal carotid diameter as the denominator. Multiphase CT imaging of the brain was performed following IV bolus contrast injection. Subsequent parametric perfusion  maps were calculated using RAPID software. CONTRAST:  171mL OMNIPAQUE IOHEXOL 350 MG/ML SOLN COMPARISON:  Prior CT from earlier the same day. FINDINGS: CTA NECK FINDINGS Aortic arch: Visualized aortic arch normal in caliber with normal 3 vessel morphology. Mild atheromatous plaque along the undersurface of the arch and about the origin of the great vessels without hemodynamically significant stenosis. Visualized subclavian arteries widely patent. Right carotid system: Right CCA patent from its origin to the bifurcation without stenosis. Mild calcified plaque about the right bifurcation/proximal right ICA without hemodynamically significant stenosis. Right ICA widely patent distally to the skull base without stenosis, dissection or occlusion. Left carotid system: Left CCA patent from its origin  to the bifurcation without stenosis. Bulky calcified plaque about the left bifurcation/proximal left ICA with associated mild stenosis of up to approximately 35% by NASCET criteria. Left ICA widely patent distally to the skull base without stenosis, dissection, or occlusion. Vertebral arteries: Both vertebral arteries arise from the subclavian arteries. Right vertebral artery dominant. Vertebral arteries widely patent within the neck without stenosis, dissection, or occlusion. Skeleton: No acute osseous finding. No discrete lytic or blastic osseous lesions. Moderate cervical spondylolysis noted at C3-4 through C6-7. Other neck: No other acute soft tissue abnormality within the neck. Thyroid appears to be absent. Upper chest: Visualized upper chest demonstrates no acute finding. Scattered pleuroparenchymal thickening with linear scarring noted within the right greater than left upper lobes. Review of the MIP images confirms the above findings CTA HEAD FINDINGS Anterior circulation: Petrous segments widely patent bilaterally. Mild atheromatous plaque within the cavernous/supraclinoid ICAs without hemodynamically significant  stenosis. A1 segments widely patent. Normal anterior communicating artery. Anterior cerebral arteries widely patent to their distal aspects without stenosis. No M1 stenosis or occlusion. Normal MCA bifurcations. Distal MCA branches well perfused and symmetric. Posterior circulation: Vertebral arteries widely patent to the vertebrobasilar junction without stenosis. Right vertebral artery dominant. Posteroinferior cerebral arteries patent bilaterally. 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 Basilar widely patent to its distal aspect without stenosis. Superior cerebellar and posterior cerebral arteries widely patent bilaterally. Venous sinuses: Patent. Anatomic variants: None significant. Review of the MIP images confirms the above findings CT Brain Perfusion Findings: ASPECTS: 10 CBF (<30%) Volume: 60mL Perfusion (Tmax>6.0s) volume: 79mL Mismatch Volume: 52mL Infarction Location:Negative CT perfusion with no evidence for core infarct or perfusion abnormality. IMPRESSION: 1. Negative CTA of the head and neck with no evidence for large vessel occlusion. 2. Negative CT perfusion, with no evidence for acute core infarct or perfusion abnormality. 3. Mild atherosclerotic change about the left carotid bifurcation with associated mild 35% stenosis. 4. No other hemodynamically significant or correctable stenosis seen about the major arterial vasculature of the head and neck. A telephone call to the ordering clinician, Dr. Ruffin Frederick was made at 2:45 a.m. on 12/10/2018. A message was left with the clinician conveying these results. Electronically Signed   By: Jeannine Boga M.D.   On: 12/10/2018 02:51   Ct Cerebral Perfusion W Contrast  Result Date: 12/10/2018 CLINICAL DATA:  Initial evaluation for acute stroke. Slurred speech. EXAM: CT ANGIOGRAPHY HEAD AND NECK CT PERFUSION BRAIN TECHNIQUE: Multidetector CT imaging of the head and neck was performed using the standard protocol during bolus  administration of intravenous contrast. Multiplanar CT image reconstructions and MIPs were obtained to evaluate the vascular anatomy. Carotid stenosis measurements (when applicable) are obtained utilizing NASCET criteria, using the distal internal carotid diameter as the denominator. Multiphase CT imaging of the brain was performed following IV bolus contrast injection. Subsequent parametric perfusion maps were calculated using RAPID software. CONTRAST:  182mL OMNIPAQUE IOHEXOL 350 MG/ML SOLN COMPARISON:  Prior CT from earlier the same day. FINDINGS: CTA NECK FINDINGS Aortic arch: Visualized aortic arch normal in caliber with normal 3 vessel morphology. Mild atheromatous plaque along the undersurface of the arch and about the origin of the great vessels without hemodynamically significant stenosis. Visualized subclavian arteries widely patent. Right carotid system: Right CCA patent from its origin to the bifurcation without stenosis. Mild calcified plaque about the right bifurcation/proximal right ICA without hemodynamically significant stenosis. Right ICA widely patent distally to the skull base without stenosis, dissection or occlusion. Left carotid system: Left CCA  patent from its origin to the bifurcation without stenosis. Bulky calcified plaque about the left bifurcation/proximal left ICA with associated mild stenosis of up to approximately 35% by NASCET criteria. Left ICA widely patent distally to the skull base without stenosis, dissection, or occlusion. Vertebral arteries: Both vertebral arteries arise from the subclavian arteries. Right vertebral artery dominant. Vertebral arteries widely patent within the neck without stenosis, dissection, or occlusion. Skeleton: No acute osseous finding. No discrete lytic or blastic osseous lesions. Moderate cervical spondylolysis noted at C3-4 through C6-7. Other neck: No other acute soft tissue abnormality within the neck. Thyroid appears to be absent. Upper chest:  Visualized upper chest demonstrates no acute finding. Scattered pleuroparenchymal thickening with linear scarring noted within the right greater than left upper lobes. Review of the MIP images confirms the above findings CTA HEAD FINDINGS Anterior circulation: Petrous segments widely patent bilaterally. Mild atheromatous plaque within the cavernous/supraclinoid ICAs without hemodynamically significant stenosis. A1 segments widely patent. Normal anterior communicating artery. Anterior cerebral arteries widely patent to their distal aspects without stenosis. No M1 stenosis or occlusion. Normal MCA bifurcations. Distal MCA branches well perfused and symmetric. Posterior circulation: Vertebral arteries widely patent to the vertebrobasilar junction without stenosis. Right vertebral artery dominant. Posteroinferior cerebral arteries patent bilaterally. 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 Basilar widely patent to its distal aspect without stenosis. Superior cerebellar and posterior cerebral arteries widely patent bilaterally. Venous sinuses: Patent. Anatomic variants: None significant. Review of the MIP images confirms the above findings CT Brain Perfusion Findings: ASPECTS: 10 CBF (<30%) Volume: 14mL Perfusion (Tmax>6.0s) volume: 81mL Mismatch Volume: 73mL Infarction Location:Negative CT perfusion with no evidence for core infarct or perfusion abnormality. IMPRESSION: 1. Negative CTA of the head and neck with no evidence for large vessel occlusion. 2. Negative CT perfusion, with no evidence for acute core infarct or perfusion abnormality. 3. Mild atherosclerotic change about the left carotid bifurcation with associated mild 35% stenosis. 4. No other hemodynamically significant or correctable stenosis seen about the major arterial vasculature of the head and neck. A telephone call to the ordering clinician, Dr. Ruffin Frederick was made at 2:45 a.m. on 12/10/2018. A message was left with the clinician  conveying these results. Electronically Signed   By: Jeannine Boga M.D.   On: 12/10/2018 02:51   Ct Head Code Stroke Wo Contrast  Result Date: 12/10/2018 CLINICAL DATA:  Code stroke. Initial evaluation for acute slurred speech. EXAM: CT HEAD WITHOUT CONTRAST TECHNIQUE: Contiguous axial images were obtained from the base of the skull through the vertex without intravenous contrast. COMPARISON:  None available. FINDINGS: Brain: Moderate age-related cerebral atrophy. Patchy and confluent hypodensity involving the periventricular deep white matter both cerebral hemispheres most consistent with chronic microvascular ischemic disease, moderately advanced. No acute intracranial hemorrhage. No acute large vessel territory infarct. No mass lesion, midline shift or mass effect. No hydrocephalus. No extra-axial fluid collection. Vascular: No hyperdense vessel. Calcified atherosclerosis present at skull base. Skull: Scalp soft tissues demonstrate no acute finding. Calvarium intact. Sinuses/Orbits: Globes and orbital soft tissues within normal limits. Visualized paranasal sinuses are clear. No mastoid effusion. Other: None. ASPECTS [facility] Stroke Program Early CT Score) - Ganglionic level infarction (caudate, lentiform nuclei, internal capsule, insula, M1-M3 cortex): 7 - Supraganglionic infarction (M4-M6 cortex): 3 Total score (0-10 with 10 being normal): 10 IMPRESSION: 1. No acute intracranial infarct or other abnormality identified. 2. ASPECTS is 10. 3. Moderately advanced cerebral atrophy with chronic microvascular ischemic disease. Critical Value/emergent results were called by telephone at  the time of interpretation on 12/10/2018 at 12:33 am to Dr. Karma Greaser, who verbally acknowledged these results. Electronically Signed   By: Jeannine Boga M.D.   On: 12/10/2018 00:38    ____________________________________________   PROCEDURES  Procedure(s) performed (including Critical Care):  .Critical  Care Performed by: Vanessa Spelter, MD Authorized by: Vanessa Rockhill, MD   Critical care provider statement:    Critical care time (minutes):  30   Critical care was necessary to treat or prevent imminent or life-threatening deterioration of the following conditions:  CNS failure or compromise   Critical care was time spent personally by me on the following activities:  Discussions with consultants, evaluation of patient's response to treatment, examination of patient, ordering and performing treatments and interventions, ordering and review of laboratory studies, ordering and review of radiographic studies, pulse oximetry, re-evaluation of patient's condition, obtaining history from patient or surrogate and review of old charts     ____________________________________________   INITIAL IMPRESSION / ASSESSMENT AND PLAN / ED COURSE    Theresa Lutts was evaluated in Emergency Department on 12/10/2018 for the symptoms described in the history of present illness. She was evaluated in the context of the global COVID-19 pandemic, which necessitated consideration that the patient might be at risk for infection with the SARS-CoV-2 virus that causes COVID-19. Institutional protocols and algorithms that pertain to the evaluation of patients at risk for COVID-19 are in a state of rapid change based on information released by regulatory bodies including the CDC and federal and state organizations. These policies and algorithms were followed during the patient's care in the ED.    Patient presents with slurred speech.  Stroke code was called from triage although patient's last known normal is out of TPA window.  However patient does have some right-sided deficits that could be concerning for large vessel occlusion.  Will get labs to evaluate for electrolyte abnormalities, hypoglycemia.  Right knee does not appear to be fractured.  Will get CT head to rule out intracranial hemorrhage.   Discussed with Dr.  Carron Brazen tele neuro. Recommend CTA/CT perfusion.   Labs re-assuring.   CT head and CTA are negative.  Will admit patient to medicine for stroke work-up   ____________________________________________   FINAL CLINICAL IMPRESSION(S) / ED DIAGNOSES   Final diagnoses:  Slurred speech  Facial asymmetry      MEDICATIONS GIVEN DURING THIS VISIT:  Medications  sodium chloride flush (NS) 0.9 % injection 3 mL (has no administration in time range)  aspirin chewable tablet 324 mg (has no administration in time range)  Tdap (BOOSTRIX) injection 0.5 mL (has no administration in time range)  iohexol (OMNIPAQUE) 350 MG/ML injection 100 mL (100 mLs Intravenous Contrast Given 12/10/18 0139)     ED Discharge Orders    None       Note:  This document was prepared using Dragon voice recognition software and may include unintentional dictation errors.   Vanessa Burns, MD 12/10/18 [phone]

## 2018-12-10 NOTE — ED Notes (Signed)
Report off to lisa rn  

## 2018-12-10 NOTE — ED Notes (Signed)
Pt brought in by family tonight with slurred speech and pt was not able to keep her train of thought and difficulty getting words out.  Sx around 2100.  No headache.  Pt fell tonight and scraped right knee.  On arrival to treatment room, pt reports feeling weak tonight and having difficulty getting dressed to come to hospital.  nsr on monitor.  md at bedside.  Iv started and labs sent.

## 2018-12-10 NOTE — Progress Notes (Signed)
Rehab Admissions Coordinator Note:  Patient was screened by Michel Santee for appropriateness for an Inpatient Acute Rehab Consult.  At this time, we are recommending Inpatient Rehab consult. Please place IP Rehab consult order if pt would like to be considered.   Michel Santee 12/10/2018, 2:05 PM  I can be reached at [phone].

## 2018-12-10 NOTE — Evaluation (Addendum)
Clinical/Bedside Swallow Evaluation Patient Details  Name: Meghan Dougherty MRN: [id number] Date of Birth: 07/26/1940  Today's Date: 12/10/2018 Time: SLP Start Time (ACUTE ONLY): 54 SLP Stop Time (ACUTE ONLY): 1020 SLP Time Calculation (min) (ACUTE ONLY): 60 min  Past Medical History:  Past Medical History:  Diagnosis Date  . Borderline diabetes   . Breast cancer, right ([place]) 11/2016   invasive mammary carcinoma, Lumpectomy and rad tx's.   . Colon polyp   . Diabetes mellitus without complication ([place])    type 2  . Hyperlipemia   . Hypertension   . Hyperthyroidism   . Hypothyroidism   . Insomnia   . Osteopenia   . Personal history of radiation therapy 2018   right breast cancer   Past Surgical History:  Past Surgical History:  Procedure Laterality Date  . ABDOMINAL HYSTERECTOMY     oophorectomy  . APPENDECTOMY    . BREAST BIOPSY Left    neg core  . BREAST BIOPSY Right    neg  core  . BREAST BIOPSY Right 11/2016   invasive mammary carcinoma, [place]  . BREAST BIOPSY Right 11/2016   benign, stero  . BREAST LUMPECTOMY Right 12/2016   invasive mammary carcinoma  Negative margins  . CATARACT EXTRACTION W/ INTRAOCULAR LENS IMPLANT    . CHOLECYSTECTOMY    . COLONOSCOPY    . COLONOSCOPY WITH PROPOFOL N/A 01/15/2018   Procedure: COLONOSCOPY WITH PROPOFOL;  Surgeon: Toledo, Benay Pike, MD;  Location: [facility] ENDOSCOPY;  Service: Gastroenterology;  Laterality: N/A;  . EYE SURGERY     bilateral cataracts  . OOPHORECTOMY    . PARTIAL MASTECTOMY WITH NEEDLE LOCALIZATION Right 01/04/2017   Procedure: PARTIAL MASTECTOMY WITH NEEDLE LOCALIZATION;  Surgeon: Leonie Green, MD;  Location: [facility] ORS;  Service: General;  Laterality: Right;  . SENTINEL NODE BIOPSY Right 01/04/2017   Procedure: SENTINEL NODE BIOPSY;  Surgeon: Leonie Green, MD;  Location: [facility] ORS;  Service: General;  Laterality: Right;  . THYROID SURGERY     total thyroidectomy  . TONSILLECTOMY    . TOTAL  THYROIDECTOMY     HPI:  Pt is a 78 y.o. female with Diabetes, prior R breast cancer w/ XRT, hypertension, hyperlipidemia who presents with slurred speech and R sided weakness.  Patient says she developed slurred speech around 4 PM on 12/09/2018 while she was talking to her friend on the phone.  She then had a fall when her right knee gave out.  She denies any pain currently.  She continued to present w/ slurred speech and difficulty with thinking of the words at the ED but stated it was improved from prior.  Also noted is continued R sided weakness.  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     Assessment / Plan / Recommendation Clinical Impression  Pt appears to present w/ oropharyngeal phase dysphagia w/ overt s/s of aspiration noted during trials of thin liquids, even w/ increased saliva during oral care. Suspect pt is at increased risk for aspiration w/ oral intake thus Pulmonary decline from impact of aspiration. Pt exhibits Moderate oral motor weakness c/b decreased lingual strength/ROM and decreased labial tone/ROM; pt also has reduced-no Gag reflex w/ palpation via tongue blade. Pt was given small TSP trials of Honey consistency liquids as trial of increased viscosity to which pt did not exhibit immediate, overt s/s of aspiration - clear vocal quality post trials appreciated. A f/u swallow x1 w/ 2/3  trials noted - pt denied any feelings of pharyngeal residue. Laryngeal excursion appeared min reduced but adequate w/ effortful swallowing. Oral phase c/b min slower bolus manipulation and A-P transfer. Oral clearing noted post swallows; no anterior spillage. OM exam revealed R lingual/labial deficits; decreased R lingual and pharyngeal sensation. Of note, during oral care, pt exhibited coughing on increased saliva generated from the care - encouraged pt to focus on swallowing more frequently during oral care and exercises to  manage saliva amount. Due to degree of suspect pharyngeal phase dysphagia, recommend pt remain NPO w/ ongoing exercises to target swallowing and OM strength/ROM. Instructed pt/family and NSG to utilize oral swabs for oral hygiene and stimulation of swallowing; aspiration precautions posted. Instructed pt on OMEs; posted exs. in room - encouraged increased OM movement and volume.  Of note, pt presented w/ Expressive language deficits(word finding impacting fluency), Dysarthria (will need to r/o any Receptive deficits as well), and possible decreased R side awareness/proprioceptive awareness and follow through. ST services will continue to follow for ongoing assessment of both Dysphagia and Speech-Language abilities.  MD/NSG and pt/family updated on above.  SLP Visit Diagnosis: Dysphagia, oropharyngeal phase (R13.12)    Aspiration Risk  Severe aspiration risk;Risk for inadequate nutrition/hydration    Diet Recommendation  NPO w/ frequent oral care for hygiene and stimulation of pharyngeal swallowing; aspiration precautions  Medication Administration: Via alternative means    Other  Recommendations Recommended Consults: (dietician f/u) Oral Care Recommendations: Oral care QID;Staff/trained caregiver to provide oral care;Patient independent with oral care(aspiration precautions during oral care) Other Recommendations: (TBD)   Follow up Recommendations Inpatient Rehab      Frequency and Duration min 3x week  2 weeks       Prognosis Prognosis for Safe Diet Advancement: Fair Barriers to Reach Goals: Severity of deficits      Swallow Study   General Date of Onset: 12/10/18 HPI: Pt is a 78 y.o. female with Diabetes, prior R breast cancer w/ XRT, hypertension, hyperlipidemia who presents with slurred speech and R sided weakness.  Patient says she developed slurred speech around 4 PM on 12/09/2018 while she was talking to her friend on the phone.  She then had a fall when her right knee gave out.   She denies any pain currently.  She continued to present w/ slurred speech and difficulty with thinking of the words at the ED but stated it was improved from prior.  Also noted is continued R sided weakness.  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   Type of Study: Bedside Swallow Evaluation Previous Swallow Assessment: none Diet Prior to this Study: NPO(regular at home per pt) Temperature Spikes Noted: No(wbc 9.5) Respiratory Status: Room air History of Recent Intubation: No Behavior/Cognition: Alert;Cooperative;Pleasant mood;Distractible;Requires cueing(min) Oral Cavity Assessment: Within Functional Limits Oral Care Completed by SLP: Yes(w/ coughing noted on saliva) Oral Cavity - Dentition: Dentures, top(native bottom dentition) Vision: Functional for self-feeding(Fair - min decreased R side attention?) Self-Feeding Abilities: Able to feed self(helped to hold cup) Patient Positioning: Upright in bed(needed full positioning support(proprioceptive defs?)) Baseline Vocal Quality: Normal(dysarthria) Volitional Cough: Strong Volitional Swallow: Able to elicit(w/ time)    Oral/Motor/Sensory Function Overall Oral Motor/Sensory Function: Moderate impairment Facial ROM: Reduced right;Suspected CN VII (facial) dysfunction Facial Symmetry: Abnormal symmetry right;Suspected CN VII (facial) dysfunction Facial Strength: Reduced right;Suspected CN VII (facial) dysfunction Facial Sensation: Reduced right;Suspected CN V (Trigeminal) dysfunction Lingual ROM: Suspected CN XII (hypoglossal) dysfunction;Reduced right(w/  ROM difficulty to the Left side) Lingual Symmetry: Abnormal symmetry right;Suspected CN XII (hypoglossal) dysfunction Lingual Strength: Reduced;Suspected CN XII (hypoglossal) dysfunction Lingual Sensation: Reduced;Suspected CN VII (facial) dysfunction-anterior 2/3 tongue Velum: Impaired right;Suspected CN X  (Vagus) dysfunction(min) Mandible: Within Functional Limits   Ice Chips Ice chips: Impaired Presentation: Spoon(fed; 3 trials) Oral Phase Impairments: Reduced lingual movement/coordination Oral Phase Functional Implications: Prolonged oral transit(min) Pharyngeal Phase Impairments: Cough - Immediate(x3/3 trials)   Thin Liquid Thin Liquid: Not tested    Nectar Thick Nectar Thick Liquid: Not tested   Honey Thick Honey Thick Liquid: Within functional limits(appeared) Presentation: Spoon(fed; 3 trials) Other Comments: f/u swallow x1 x2 trials - pt denied any feelings of pharyngeal residue   Puree Puree: Not tested   Solid     Solid: Not tested        Orinda Kenner, MS, CCC-SLP Watson,Katherine 12/10/2018,3:37 PM

## 2018-12-10 NOTE — ED Notes (Signed)
ED TO INPATIENT HANDOFF REPORT  ED Nurse Name and Phone #: Lattie Haw [phone]  S Name/Age/Gender Meghan Dougherty 78 y.o. female Room/Bed: ED04A/ED04A  Code Status   Code Status: Full Code  Home/SNF/Other Home Patient oriented to: self, place, time and situation Is this baseline? Yes   Triage Complete: Triage complete  Chief Complaint Fall  Triage Note Patient fell and hit head at approx 2130 tonight (knee gave out). Patient now with slurred speech and having trouble getting her thoughts together.    Allergies Allergies  Allergen Reactions  . Levaquin [Levofloxacin] Rash  . Penicillins Rash    Has patient had a PCN reaction causing immediate rash, facial/tongue/throat swelling, SOB or lightheadedness with hypotension: No Has patient had a PCN reaction causing severe rash involving mucus membranes or skin necrosis: No Has patient had a PCN reaction that required hospitalization: No Has patient had a PCN reaction occurring within the last 10 years: No If all of the above answers are "NO", then may proceed with Cephalosporin use.   . Trazodone Other (See Comments)    Headache and nightmares    Level of Care/Admitting Diagnosis ED Disposition    ED Disposition Condition [facility] Area: [place] [[id number]]  Level of Care: Med-Surg [16]  Covid Evaluation: Asymptomatic Screening Protocol (No Symptoms)  Diagnosis: TIA (transient ischemic attack) [id number]  Admitting Physician: Christel Mormon [id number]  Attending Physician: Christel Mormon [id number]  PT Class (Do Not Modify): Observation [104]  PT Acc Code (Do Not Modify): Observation [10022]       B Medical/Surgery History Past Medical History:  Diagnosis Date  . Borderline diabetes   . Breast cancer, right ([place]) 11/2016   invasive mammary carcinoma, Lumpectomy and rad tx's.   . Colon polyp   . Diabetes mellitus without complication ([place])    type 2  . Hyperlipemia   . Hypertension    . Hyperthyroidism   . Hypothyroidism   . Insomnia   . Osteopenia   . Personal history of radiation therapy 2018   right breast cancer   Past Surgical History:  Procedure Laterality Date  . ABDOMINAL HYSTERECTOMY     oophorectomy  . APPENDECTOMY    . BREAST BIOPSY Left    neg core  . BREAST BIOPSY Right    neg  core  . BREAST BIOPSY Right 11/2016   invasive mammary carcinoma, [place]  . BREAST BIOPSY Right 11/2016   benign, stero  . BREAST LUMPECTOMY Right 12/2016   invasive mammary carcinoma  Negative margins  . CATARACT EXTRACTION W/ INTRAOCULAR LENS IMPLANT    . CHOLECYSTECTOMY    . COLONOSCOPY    . COLONOSCOPY WITH PROPOFOL N/A 01/15/2018   Procedure: COLONOSCOPY WITH PROPOFOL;  Surgeon: Toledo, Benay Pike, MD;  Location: [facility] ENDOSCOPY;  Service: Gastroenterology;  Laterality: N/A;  . EYE SURGERY     bilateral cataracts  . OOPHORECTOMY    . PARTIAL MASTECTOMY WITH NEEDLE LOCALIZATION Right 01/04/2017   Procedure: PARTIAL MASTECTOMY WITH NEEDLE LOCALIZATION;  Surgeon: Leonie Green, MD;  Location: [facility] ORS;  Service: General;  Laterality: Right;  . SENTINEL NODE BIOPSY Right 01/04/2017   Procedure: SENTINEL NODE BIOPSY;  Surgeon: Leonie Green, MD;  Location: [facility] ORS;  Service: General;  Laterality: Right;  . THYROID SURGERY     total thyroidectomy  . TONSILLECTOMY    . TOTAL THYROIDECTOMY       A IV Location/Drains/Wounds Patient Lines/Drains/Airways Status   Active  Line/Drains/Airways    Name:   Placement date:   Placement time:   Site:   Days:   Peripheral IV 12/10/18 Left Forearm   12/10/18    0124    Forearm   less than 1          Intake/Output Last 24 hours No intake or output data in the 24 hours ending 12/10/18 0531  Labs/Imaging Results for orders placed or performed during the hospital encounter of 12/10/18 (from the past 48 hour(s))  Protime-INR     Status: None   Collection Time: 12/10/18 12:34 AM  Result Value Ref Range   Prothrombin  Time 12.6 11.4 - 15.2 seconds   INR 1.0 0.8 - 1.2    Comment: (NOTE) INR goal varies based on device and disease states. Performed at [facility], [address] [place], [place] [address]   APTT     Status: None   Collection Time: 12/10/18 12:34 AM  Result Value Ref Range   aPTT 26 24 - 36 seconds    Comment: Performed at [facility], [address] [place], [place] [address]  CBC     Status: Abnormal   Collection Time: 12/10/18 12:34 AM  Result Value Ref Range   WBC 9.5 4.0 - 10.5 K/uL   RBC 4.26 3.87 - 5.11 MIL/uL   Hemoglobin 13.8 12.0 - 15.0 g/dL   HCT 50.5 (H) 36.0 - 46.0 %   MCV 118.5 (H) 80.0 - 100.0 fL   MCH 32.4 26.0 - 34.0 pg   MCHC 27.3 (L) 30.0 - 36.0 g/dL   RDW 11.5 11.5 - 15.5 %   Platelets 300 150 - 400 K/uL   nRBC 0.0 0.0 - 0.2 %    Comment: Performed at [facility], [address] [place], [place] [address]  Differential     Status: Abnormal   Collection Time: 12/10/18 12:34 AM  Result Value Ref Range   Neutrophils Relative % 69 %   Neutro Abs 6.6 1.7 - 7.7 K/uL   Lymphocytes Relative 15 %   Lymphs Abs 1.4 0.7 - 4.0 K/uL   Monocytes Relative 12 %   Monocytes Absolute 1.1 (H) 0.1 - 1.0 K/uL   Eosinophils Relative 2 %   Eosinophils Absolute 0.2 0.0 - 0.5 K/uL   Basophils Relative 1 %   Basophils Absolute 0.1 0.0 - 0.1 K/uL   WBC Morphology MORPHOLOGY UNREMARKABLE    RBC Morphology MORPHOLOGY UNREMARKABLE    Smear Review MORPHOLOGY UNREMARKABLE    Immature Granulocytes 1 %   Abs Immature Granulocytes 0.05 0.00 - 0.07 K/uL    Comment: Performed at [facility], [address] [place], [place] [address]  Comprehensive metabolic panel     Status: Abnormal   Collection Time: 12/10/18 12:34 AM  Result Value Ref Range   Sodium 132 (L) 135 - 145 mmol/L   Potassium 3.7 3.5 - 5.1 mmol/L   Chloride 96 (L) 98 - 111 mmol/L   CO2 23 22 - 32 mmol/L   Glucose, Bld 133 (H) 70 - 99 mg/dL   BUN 15 8 - 23 mg/dL    Creatinine, Ser 0.89 0.44 - 1.00 mg/dL   Calcium 9.3 8.9 - 10.3 mg/dL   Total Protein 7.4 6.5 - 8.1 g/dL   Albumin 4.3 3.5 - 5.0 g/dL   AST 31 15 - 41 U/L   ALT 21 0 - 44 U/L   Alkaline Phosphatase 76 38 - 126 U/L   Total  Bilirubin 0.6 0.3 - 1.2 mg/dL   GFR calc non Af Amer >60 >60 mL/min   GFR calc Af Amer >60 >60 mL/min   Anion gap 13 5 - 15    Comment: Performed at [facility], [address] [place], [place] [address]  SARS Coronavirus 2 [facility] order, Performed in [facility] hospital lab) Nasopharyngeal Nasopharyngeal Swab     Status: None   Collection Time: 12/10/18 12:34 AM   Specimen: Nasopharyngeal Swab  Result Value Ref Range   SARS Coronavirus 2 NEGATIVE NEGATIVE    Comment: (NOTE) If result is NEGATIVE SARS-CoV-2 target nucleic acids are NOT DETECTED. The SARS-CoV-2 RNA is generally detectable in upper and lower  respiratory specimens during the acute phase of infection. The lowest  concentration of SARS-CoV-2 viral copies this assay can detect is 250  copies / mL. A negative result does not preclude SARS-CoV-2 infection  and should not be used as the sole basis for treatment or other  patient management decisions.  A negative result may occur with  improper specimen collection / handling, submission of specimen other  than nasopharyngeal swab, presence of viral mutation(s) within the  areas targeted by this assay, and inadequate number of viral copies  (<250 copies / mL). A negative result must be combined with clinical  observations, patient history, and epidemiological information. If result is POSITIVE SARS-CoV-2 target nucleic acids are DETECTED. The SARS-CoV-2 RNA is generally detectable in upper and lower  respiratory specimens dur ing the acute phase of infection.  Positive  results are indicative of active infection with SARS-CoV-2.  Clinical  correlation with patient history and other diagnostic information is  necessary to determine patient  infection status.  Positive results do  not rule out bacterial infection or co-infection with other viruses. If result is PRESUMPTIVE POSTIVE SARS-CoV-2 nucleic acids MAY BE PRESENT.   A presumptive positive result was obtained on the submitted specimen  and confirmed on repeat testing.  While 2019 novel coronavirus  (SARS-CoV-2) nucleic acids may be present in the submitted sample  additional confirmatory testing may be necessary for epidemiological  and / or clinical management purposes  to differentiate between  SARS-CoV-2 and other Sarbecovirus currently known to infect humans.  If clinically indicated additional testing with an alternate test  methodology [phone]) is advised. The SARS-CoV-2 RNA is generally  detectable in upper and lower respiratory sp ecimens during the acute  phase of infection. The expected result is Negative. Fact Sheet for Patients:  StrictlyIdeas.no Fact Sheet for Healthcare Providers: BankingDealers.co.za This test is not yet approved or cleared by the [place] FDA and has been authorized for detection and/or diagnosis of SARS-CoV-2 by FDA under an Emergency Use Authorization (EUA).  This EUA will remain in effect (meaning this test can be used) for the duration of the COVID-19 declaration under Section 564(b)(1) of the Act, 21 U.S.C. section 360bbb-3(b)(1), unless the authorization is terminated or revoked sooner. Performed at [facility], [place], [place] [address]    Ct Angio Head W Or Wo Contrast  Result Date: 12/10/2018 CLINICAL DATA:  Initial evaluation for acute stroke. Slurred speech. EXAM: CT ANGIOGRAPHY HEAD AND NECK CT PERFUSION BRAIN TECHNIQUE: Multidetector CT imaging of the head and neck was performed using the standard protocol during bolus administration of intravenous contrast. Multiplanar CT image reconstructions and MIPs were obtained to evaluate the vascular  anatomy. Carotid stenosis measurements (when applicable) are obtained utilizing NASCET criteria, using the distal internal carotid  diameter as the denominator. Multiphase CT imaging of the brain was performed following IV bolus contrast injection. Subsequent parametric perfusion maps were calculated using RAPID software. CONTRAST:  127mL OMNIPAQUE IOHEXOL 350 MG/ML SOLN COMPARISON:  Prior CT from earlier the same day. FINDINGS: CTA NECK FINDINGS Aortic arch: Visualized aortic arch normal in caliber with normal 3 vessel morphology. Mild atheromatous plaque along the undersurface of the arch and about the origin of the great vessels without hemodynamically significant stenosis. Visualized subclavian arteries widely patent. Right carotid system: Right CCA patent from its origin to the bifurcation without stenosis. Mild calcified plaque about the right bifurcation/proximal right ICA without hemodynamically significant stenosis. Right ICA widely patent distally to the skull base without stenosis, dissection or occlusion. Left carotid system: Left CCA patent from its origin to the bifurcation without stenosis. Bulky calcified plaque about the left bifurcation/proximal left ICA with associated mild stenosis of up to approximately 35% by NASCET criteria. Left ICA widely patent distally to the skull base without stenosis, dissection, or occlusion. Vertebral arteries: Both vertebral arteries arise from the subclavian arteries. Right vertebral artery dominant. Vertebral arteries widely patent within the neck without stenosis, dissection, or occlusion. Skeleton: No acute osseous finding. No discrete lytic or blastic osseous lesions. Moderate cervical spondylolysis noted at C3-4 through C6-7. Other neck: No other acute soft tissue abnormality within the neck. Thyroid appears to be absent. Upper chest: Visualized upper chest demonstrates no acute finding. Scattered pleuroparenchymal thickening with linear scarring noted within  the right greater than left upper lobes. Review of the MIP images confirms the above findings CTA HEAD FINDINGS Anterior circulation: Petrous segments widely patent bilaterally. Mild atheromatous plaque within the cavernous/supraclinoid ICAs without hemodynamically significant stenosis. A1 segments widely patent. Normal anterior communicating artery. Anterior cerebral arteries widely patent to their distal aspects without stenosis. No M1 stenosis or occlusion. Normal MCA bifurcations. Distal MCA branches well perfused and symmetric. Posterior circulation: Vertebral arteries widely patent to the vertebrobasilar junction without stenosis. Right vertebral artery dominant. Posteroinferior cerebral arteries patent bilaterally. 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 Basilar widely patent to its distal aspect without stenosis. Superior cerebellar and posterior cerebral arteries widely patent bilaterally. Venous sinuses: Patent. Anatomic variants: None significant. Review of the MIP images confirms the above findings CT Brain Perfusion Findings: ASPECTS: 10 CBF (<30%) Volume: 43mL Perfusion (Tmax>6.0s) volume: 26mL Mismatch Volume: 34mL Infarction Location:Negative CT perfusion with no evidence for core infarct or perfusion abnormality. IMPRESSION: 1. Negative CTA of the head and neck with no evidence for large vessel occlusion. 2. Negative CT perfusion, with no evidence for acute core infarct or perfusion abnormality. 3. Mild atherosclerotic change about the left carotid bifurcation with associated mild 35% stenosis. 4. No other hemodynamically significant or correctable stenosis seen about the major arterial vasculature of the head and neck. A telephone call to the ordering clinician, Dr. Ruffin Frederick was made at 2:45 a.m. on 12/10/2018. A message was left with the clinician conveying these results. Electronically Signed   By: Jeannine Boga M.D.   On: 12/10/2018 02:51   Dg Chest 2  View  Result Date: 12/10/2018 CLINICAL DATA:  TIA. EXAM: CHEST - 2 VIEW COMPARISON:  None. FINDINGS: Heart size is normal. Chronic interstitial coarsening is noted. There is no edema or effusion. No focal airspace disease is present. Chronic scarring is noted at the left base. Degenerative changes are noted in the thoracic spine. IMPRESSION: 1. No acute cardiopulmonary disease. 2. Chronic changes of COPD. Electronically Signed  By: San Morelle M.D.   On: 12/10/2018 04:09   Ct Angio Neck W Or Wo Contrast  Result Date: 12/10/2018 CLINICAL DATA:  Initial evaluation for acute stroke. Slurred speech. EXAM: CT ANGIOGRAPHY HEAD AND NECK CT PERFUSION BRAIN TECHNIQUE: Multidetector CT imaging of the head and neck was performed using the standard protocol during bolus administration of intravenous contrast. Multiplanar CT image reconstructions and MIPs were obtained to evaluate the vascular anatomy. Carotid stenosis measurements (when applicable) are obtained utilizing NASCET criteria, using the distal internal carotid diameter as the denominator. Multiphase CT imaging of the brain was performed following IV bolus contrast injection. Subsequent parametric perfusion maps were calculated using RAPID software. CONTRAST:  179mL OMNIPAQUE IOHEXOL 350 MG/ML SOLN COMPARISON:  Prior CT from earlier the same day. FINDINGS: CTA NECK FINDINGS Aortic arch: Visualized aortic arch normal in caliber with normal 3 vessel morphology. Mild atheromatous plaque along the undersurface of the arch and about the origin of the great vessels without hemodynamically significant stenosis. Visualized subclavian arteries widely patent. Right carotid system: Right CCA patent from its origin to the bifurcation without stenosis. Mild calcified plaque about the right bifurcation/proximal right ICA without hemodynamically significant stenosis. Right ICA widely patent distally to the skull base without stenosis, dissection or occlusion. Left  carotid system: Left CCA patent from its origin to the bifurcation without stenosis. Bulky calcified plaque about the left bifurcation/proximal left ICA with associated mild stenosis of up to approximately 35% by NASCET criteria. Left ICA widely patent distally to the skull base without stenosis, dissection, or occlusion. Vertebral arteries: Both vertebral arteries arise from the subclavian arteries. Right vertebral artery dominant. Vertebral arteries widely patent within the neck without stenosis, dissection, or occlusion. Skeleton: No acute osseous finding. No discrete lytic or blastic osseous lesions. Moderate cervical spondylolysis noted at C3-4 through C6-7. Other neck: No other acute soft tissue abnormality within the neck. Thyroid appears to be absent. Upper chest: Visualized upper chest demonstrates no acute finding. Scattered pleuroparenchymal thickening with linear scarring noted within the right greater than left upper lobes. Review of the MIP images confirms the above findings CTA HEAD FINDINGS Anterior circulation: Petrous segments widely patent bilaterally. Mild atheromatous plaque within the cavernous/supraclinoid ICAs without hemodynamically significant stenosis. A1 segments widely patent. Normal anterior communicating artery. Anterior cerebral arteries widely patent to their distal aspects without stenosis. No M1 stenosis or occlusion. Normal MCA bifurcations. Distal MCA branches well perfused and symmetric. Posterior circulation: Vertebral arteries widely patent to the vertebrobasilar junction without stenosis. Right vertebral artery dominant. Posteroinferior cerebral arteries patent bilaterally. 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 Basilar widely patent to its distal aspect without stenosis. Superior cerebellar and posterior cerebral arteries widely patent bilaterally. Venous sinuses: Patent. Anatomic variants: None significant. Review of the MIP images confirms the  above findings CT Brain Perfusion Findings: ASPECTS: 10 CBF (<30%) Volume: 57mL Perfusion (Tmax>6.0s) volume: 39mL Mismatch Volume: 21mL Infarction Location:Negative CT perfusion with no evidence for core infarct or perfusion abnormality. IMPRESSION: 1. Negative CTA of the head and neck with no evidence for large vessel occlusion. 2. Negative CT perfusion, with no evidence for acute core infarct or perfusion abnormality. 3. Mild atherosclerotic change about the left carotid bifurcation with associated mild 35% stenosis. 4. No other hemodynamically significant or correctable stenosis seen about the major arterial vasculature of the head and neck. A telephone call to the ordering clinician, Dr. Ruffin Frederick was made at 2:45 a.m. on 12/10/2018. A message was left with the clinician conveying  these results. Electronically Signed   By: Jeannine Boga M.D.   On: 12/10/2018 02:51   Mr Brain Wo Contrast  Result Date: 12/10/2018 CLINICAL DATA:  TIA, initial exam. Acute onset of slurred speech and difficulty with word finding beginning at 9 o'clock p.m. last night. EXAM: MRI HEAD WITHOUT CONTRAST TECHNIQUE: Multiplanar, multiecho pulse sequences of the brain and surrounding structures were obtained without intravenous contrast. COMPARISON:  CTA head and neck and CT perfusion 12/10/2018 FINDINGS: Brain: The diffusion-weighted images demonstrate restricted diffusion consistent with an acute nonhemorrhagic infarct in the posterior limb of the left internal capsule. The area of infarction there is linear measuring 11 mm. Moderate confluent periventricular and scattered subcortical T2 hyperintensities are present bilaterally. Dilated perivascular spaces are present throughout the basal ganglia. The ventricles are proportionate to the degree of atrophy. Corpus callosum is normal. No significant extraaxial fluid collection is present. The internal auditory canals are within normal limits. The brainstem and cerebellum are  within normal limits. Vascular: Flow is present in the major intracranial arteries. Skull and upper cervical spine: The craniocervical junction is normal. Upper cervical spine is within normal limits. Marrow signal is unremarkable. Sinuses/Orbits: The paranasal sinuses and mastoid air cells are clear. Bilateral lens replacements are noted. Globes and orbits are otherwise unremarkable. IMPRESSION: 1. 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 2. Moderate diffuse confluent periventricular and scattered subcortical T2 hyperintensities bilaterally consistent with chronic microvascular ischemic change. Electronically Signed   By: San Morelle M.D.   On: 12/10/2018 05:13   Ct Cerebral Perfusion W Contrast  Result Date: 12/10/2018 CLINICAL DATA:  Initial evaluation for acute stroke. Slurred speech. EXAM: CT ANGIOGRAPHY HEAD AND NECK CT PERFUSION BRAIN TECHNIQUE: Multidetector CT imaging of the head and neck was performed using the standard protocol during bolus administration of intravenous contrast. Multiplanar CT image reconstructions and MIPs were obtained to evaluate the vascular anatomy. Carotid stenosis measurements (when applicable) are obtained utilizing NASCET criteria, using the distal internal carotid diameter as the denominator. Multiphase CT imaging of the brain was performed following IV bolus contrast injection. Subsequent parametric perfusion maps were calculated using RAPID software. CONTRAST:  153mL OMNIPAQUE IOHEXOL 350 MG/ML SOLN COMPARISON:  Prior CT from earlier the same day. FINDINGS: CTA NECK FINDINGS Aortic arch: Visualized aortic arch normal in caliber with normal 3 vessel morphology. Mild atheromatous plaque along the undersurface of the arch and about the origin of the great vessels without hemodynamically significant stenosis. Visualized subclavian arteries widely patent. Right carotid system: Right CCA patent from its  origin to the bifurcation without stenosis. Mild calcified plaque about the right bifurcation/proximal right ICA without hemodynamically significant stenosis. Right ICA widely patent distally to the skull base without stenosis, dissection or occlusion. Left carotid system: Left CCA patent from its origin to the bifurcation without stenosis. Bulky calcified plaque about the left bifurcation/proximal left ICA with associated mild stenosis of up to approximately 35% by NASCET criteria. Left ICA widely patent distally to the skull base without stenosis, dissection, or occlusion. Vertebral arteries: Both vertebral arteries arise from the subclavian arteries. Right vertebral artery dominant. Vertebral arteries widely patent within the neck without stenosis, dissection, or occlusion. Skeleton: No acute osseous finding. No discrete lytic or blastic osseous lesions. Moderate cervical spondylolysis noted at C3-4 through C6-7. Other neck: No other acute soft tissue abnormality within the neck. Thyroid appears to be absent. Upper chest: Visualized upper chest demonstrates no acute finding. Scattered pleuroparenchymal thickening with  linear scarring noted within the right greater than left upper lobes. Review of the MIP images confirms the above findings CTA HEAD FINDINGS Anterior circulation: Petrous segments widely patent bilaterally. Mild atheromatous plaque within the cavernous/supraclinoid ICAs without hemodynamically significant stenosis. A1 segments widely patent. Normal anterior communicating artery. Anterior cerebral arteries widely patent to their distal aspects without stenosis. No M1 stenosis or occlusion. Normal MCA bifurcations. Distal MCA branches well perfused and symmetric. Posterior circulation: Vertebral arteries widely patent to the vertebrobasilar junction without stenosis. Right vertebral artery dominant. Posteroinferior cerebral arteries patent bilaterally. Short fenestration involving the proximal basilar  artery noted. Basilar widely patent to its distal aspect without stenosis. Superior cerebellar and posterior cerebral arteries widely patent bilaterally. Venous sinuses: Patent. Anatomic variants: None significant. Review of the MIP images confirms the above findings CT Brain Perfusion Findings: ASPECTS: 10 CBF (<30%) Volume: 44mL Perfusion (Tmax>6.0s) volume: 4mL Mismatch Volume: 19mL Infarction Location:Negative CT perfusion with no evidence for core infarct or perfusion abnormality. IMPRESSION: 1. Negative CTA of the head and neck with no evidence for large vessel occlusion. 2. Negative CT perfusion, with no evidence for acute core infarct or perfusion abnormality. 3. Mild atherosclerotic change about the left carotid bifurcation with associated mild 35% stenosis. 4. No other hemodynamically significant or correctable stenosis seen about the major arterial vasculature of the head and neck. A telephone call to the ordering clinician, Dr. Ruffin Frederick was made at 2:45 a.m. on 12/10/2018. A message was left with the clinician conveying these results. Electronically Signed   By: Jeannine Boga M.D.   On: 12/10/2018 02:51   Ct Head Code Stroke Wo Contrast  Result Date: 12/10/2018 CLINICAL DATA:  Code stroke. Initial evaluation for acute slurred speech. EXAM: CT HEAD WITHOUT CONTRAST TECHNIQUE: Contiguous axial images were obtained from the base of the skull through the vertex without intravenous contrast. COMPARISON:  None available. FINDINGS: Brain: Moderate age-related cerebral atrophy. Patchy and confluent hypodensity involving the periventricular deep white matter both cerebral hemispheres most consistent with chronic microvascular ischemic disease, moderately advanced. No acute intracranial hemorrhage. No acute large vessel territory infarct. No mass lesion, midline shift or mass effect. No hydrocephalus. No extra-axial fluid collection. Vascular: No hyperdense vessel. Calcified atherosclerosis present at  skull base. Skull: Scalp soft tissues demonstrate no acute finding. Calvarium intact. Sinuses/Orbits: Globes and orbital soft tissues within normal limits. Visualized paranasal sinuses are clear. No mastoid effusion. Other: None. ASPECTS [facility] Stroke Program Early CT Score) - Ganglionic level infarction (caudate, lentiform nuclei, internal capsule, insula, M1-M3 cortex): 7 - Supraganglionic infarction (M4-M6 cortex): 3 Total score (0-10 with 10 being normal): 10 IMPRESSION: 1. No acute intracranial infarct or other abnormality identified. 2. ASPECTS is 10. 3. Moderately advanced cerebral atrophy with chronic microvascular ischemic disease. Critical Value/emergent results were called by telephone at the time of interpretation on 12/10/2018 at 12:33 am to Dr. Karma Greaser, who verbally acknowledged these results. Electronically Signed   By: Jeannine Boga M.D.   On: 12/10/2018 00:38    Pending Labs Unresulted Labs (From admission, onward)    Start     Ordered   12/10/18 0500  Hemoglobin A1c  Tomorrow morning,   STAT     12/10/18 0323   12/10/18 0500  Lipid panel  Tomorrow morning,   STAT    Comments: Fasting    12/10/18 0323          Vitals/Pain Today's Vitals   12/10/18 0246 12/10/18 0256 12/10/18 0300 12/10/18 0508  BP: (!) 148/90  Marland Kitchen)  160/77 (!) 163/79  Pulse: 84 77 80   Resp: 19 (!) 23 (!) 27 (!) 25  Temp:      TempSrc:      SpO2: 97% 97% 97% 97%  Weight:      Height:      PainSc:   0-No pain 0-No pain    Isolation Precautions No active isolations  Medications Medications  sodium chloride flush (NS) 0.9 % injection 3 mL (has no administration in time range)  aspirin EC tablet 81 mg (has no administration in time range)  amLODipine (NORVASC) tablet 5 mg (has no administration in time range)  hydrochlorothiazide (HYDRODIURIL) tablet 12.5 mg (has no administration in time range)  lisinopril (ZESTRIL) tablet 40 mg (has no administration in time range)  metoprolol tartrate  (LOPRESSOR) tablet 100 mg (100 mg Oral Not Given 12/10/18 0515)  pravastatin (PRAVACHOL) tablet 10 mg (has no administration in time range)  PARoxetine (PAXIL) tablet 10 mg (has no administration in time range)  zolpidem (AMBIEN) tablet 2.5 mg (has no administration in time range)  levothyroxine (SYNTHROID) tablet 50 mcg (50 mcg Oral Not Given 12/10/18 0515)  pantoprazole (PROTONIX) EC tablet 40 mg (has no administration in time range)  Probiotic Daily CAPS (has no administration in time range)  calcium-vitamin D (OSCAL WITH D) 500-200 MG-UNIT per tablet 2 tablet (has no administration in time range)  multivitamin with minerals tablet 2 tablet (has no administration in time range)  omega-3 acid ethyl esters (LOVAZA) capsule 1 g (has no administration in time range)  fluticasone (FLONASE) 50 MCG/ACT nasal spray 1 spray (has no administration in time range)  clopidogrel (PLAVIX) Pediatric oral suspension 5 mg/mL (has no administration in time range)   stroke: mapping our early stages of recovery book (has no administration in time range)  0.9 %  sodium chloride infusion (has no administration in time range)  acetaminophen (TYLENOL) tablet 650 mg (has no administration in time range)    Or  acetaminophen (TYLENOL) solution 650 mg (has no administration in time range)    Or  acetaminophen (TYLENOL) suppository 650 mg (has no administration in time range)  senna-docusate (Senokot-S) tablet 1 tablet (has no administration in time range)  enoxaparin (LOVENOX) injection 40 mg (has no administration in time range)  ondansetron (ZOFRAN) injection 4 mg (has no administration in time range)  traZODone (DESYREL) tablet 25 mg (has no administration in time range)  iohexol (OMNIPAQUE) 350 MG/ML injection 100 mL (100 mLs Intravenous Contrast Given 12/10/18 0139)  aspirin chewable tablet 324 mg (324 mg Oral Given 12/10/18 0318)    Mobility walks High fall risk   Focused Assessments Neuro Assessment  Handoff:  Swallow screen pass? No  Cardiac Rhythm: Normal sinus rhythm NIH Stroke Scale ( + Modified Stroke Scale Criteria)  Interval: Initial Level of Consciousness (1a.)   : Alert, keenly responsive LOC Questions (1b. )   +: Answers both questions correctly LOC Commands (1c. )   + : Performs both tasks correctly Best Gaze (2. )  +: Normal Visual (3. )  +: No visual loss Facial Palsy (4. )    : Minor paralysis Motor Arm, Left (5a. )   +: No drift Motor Arm, Right (5b. )   +: No drift Motor Leg, Left (6a. )   +: No drift Motor Leg, Right (6b. )   +: No drift Limb Ataxia (7. ): Present in one limb Sensory (8. )   +: Normal, no sensory loss Best Language (9. )   +:  Mild-to-moderate aphasia Dysarthria (10. ): Mild-to-moderate dysarthria, patient slurs at least some words and, at worst, can be understood with some difficulty Extinction/Inattention (11.)   +: No Abnormality Modified SS Total  +: 1 Complete NIHSS TOTAL: 4 Last date known well: 12/09/18 Last time known well: 2100 Neuro Assessment: Exceptions to [facility] Neuro Checks:   Initial (12/10/18 0043)  Last Documented NIHSS Modified Score: 1 (12/10/18 0330) Has TPA been given? No If patient is a Neuro Trauma and patient is going to OR before floor call report to [place] nurse: [phone] or [phone]     R Recommendations: See Admitting Provider Note  Report given to:   Additional Notes: none

## 2018-12-10 NOTE — ED Notes (Signed)
ED Provider at bedside. 

## 2018-12-10 NOTE — Progress Notes (Signed)
Admitted this morning for right-sided weakness, dysarthria with acute stroke and left foraminal capsule by MRI of the brain.  Patient has expressive dysarthria, right facial droop, mild weakness on the right side. 1.  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  Seen by neurology, want to give aspirin 320 mg daily, discontinue Plavix.  Physical therapy recommends inpatient rehab.  Speech therapy recommended n.p.o. for today.Marland Kitchen

## 2018-12-10 NOTE — Progress Notes (Signed)
*  PRELIMINARY RESULTS* Echocardiogram 2D Echocardiogram has been performed.  Meghan Dougherty 12/10/2018, 3:13 PM

## 2018-12-10 NOTE — Evaluation (Signed)
Physical Therapy Evaluation Patient Details Name: Meghan Dougherty MRN: [id number] DOB: 13-Aug-1940 Today's Date: 12/10/2018   History of Present Illness  Pt is a 78 y.o. female presenting to hospital 12/10/18 with slurred speech starting about 4 pm 9/1 and then had a fall around 2130 when R knee gave out; pt also noted with R facial droop, R sided weakness, expressive aphasia, and dysarthria.  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  PMH includes R breast CA with h/o radiation treatments and partial mastectomy 01/04/2017, DM, htn, and h/o thyroid surgery.  Clinical Impression  Prior to hospital admission, pt was independent.  Pt lives alone in 1 level home with steps to enter. R UE and R LE weakness noted (see below for strength details) as well as dysarthria and expressive aphasia.  Currently pt is mod to max assist semi-supine to sit; min to mod assist to stand from bed up to RW; and min to mod assist to ambulate 4 feet with youth sized RW bed to recliner with vc's and assist for technique and advancing R LE.  Pt demonstrating R lean in sitting and standing requiring cueing to obtain midline.  Pt appearing motivated and demonstrating appropriate tolerance to therapy session activities.  No c/o pain.  Pt would benefit from skilled PT to address noted impairments and functional limitations (see below for any additional details).  Upon hospital discharge, recommend pt discharge to CIR.    Follow Up Recommendations CIR    Equipment Recommendations  Rolling walker with 5" wheels;3in1 (PT)(youth sized)    Recommendations for Other Services OT consult     Precautions / Restrictions Precautions Precautions: Fall Precaution Comments: NPO Restrictions Weight Bearing Restrictions: No      Mobility  Bed Mobility Overal bed mobility: Needs Assistance Bed Mobility: Supine to Sit     Supine to sit: Mod assist;Max  assist;HOB elevated     General bed mobility comments: vc's and mod assist for bridging to scoot hips towards edge of bed to R side x3 reps; pt able to move B LE's towards edge of bed to R side with increased time on own but required mod to max assist for trunk to sit up with vc's for UE use to assist  Transfers Overall transfer level: Needs assistance Equipment used: Rolling walker (2 wheeled)(youth sized) Transfers: Sit to/from Stand Sit to Stand: Min assist;Mod assist         General transfer comment: vc's for UE/LE placement and overall technique; assist to initiate stand from bed and control descent sitting in recliner  Ambulation/Gait Ambulation/Gait assistance: Min assist;Mod assist Gait Distance (Feet): 4 Feet(bed to recliner) Assistive device: Rolling walker (2 wheeled)(youth sized)   Gait velocity: decreased   General Gait Details: decreased stance time R LE; vc's for R quad set during R LE stance phase and to increase UE support through RW to improve ability to advance L LE; assist to advance R LE; assist and vc's to weightshift to L in order to advance R LE  Stairs            Wheelchair Mobility    Modified Rankin (Stroke Patients Only)       Balance Overall balance assessment: Needs assistance Sitting-balance support: Bilateral upper extremity supported;Feet supported Sitting balance-Leahy Scale: Fair Sitting balance - Comments: pt noted with mild R lateral lean with sitting balance (SBA for safety); intermittent vc's and visual cues required to correct to midline (unable  to maintain consistently without cueing) Postural control: Right lateral lean Standing balance support: Bilateral upper extremity supported Standing balance-Leahy Scale: Poor Standing balance comment: pt with R lateral lean in standing with RW requiring intermittent vc's to shift weight left to correct (fluctuating between CGA to min assist for standing balance)                              Pertinent Vitals/Pain Pain Assessment: No/denies pain  Vitals (HR and O2 on room air) stable and WFL throughout treatment session.    Home Living Family/patient expects to be discharged to:: Private residence Living Arrangements: Alone Available Help at Discharge: Family Type of Home: House Home Access: Stairs to enter   [employer] of Steps: 2 steps with R railing from garage or 1 step (no railing) from front entrance [place]: One level [place] - single point      Prior Function Level of Independence: Independent         Comments: No falls in past 6 months.     Hand Dominance   Dominant Hand: Right    Extremity/Trunk Assessment   Upper Extremity Assessment Upper Extremity Assessment: Defer to OT evaluation RUE Deficits / Details: Per OT evaluation: "Right shoulder flexion, and abduction 3-/5, elbow flexion, extension, wrist extension 3+/5, decreased grip strength" RUE Sensation: WNL RUE Coordination: decreased fine motor;decreased gross motor    Lower Extremity Assessment Lower Extremity Assessment: RLE deficits/detail;LLE deficits/detail(intact B LE tone, sensation, and proprioception) RLE Deficits / Details: hip flexion 4/5; knee extension 3-/5; knee flexion 4/5; DF 4/5 RLE Sensation: WNL RLE Coordination: (more difficulty with R LE heel to shin coordination d/t R LE weakness) LLE Deficits / Details: WFL strength and ROM    Cervical / Trunk Assessment Cervical / Trunk Assessment: Normal  Communication   Communication: Expressive difficulties  Cognition Arousal/Alertness: Awake/alert Behavior During Therapy: WFL for tasks assessed/performed Overall Cognitive Status: Within Functional Limits for tasks assessed                                        General Comments General comments (skin integrity, edema, etc.): skin tear noted R knee.  Nursing cleared pt for participation in physical therapy.  Pt  agreeable to PT session.  Pt's son present during session.    Exercises  Bed mobility, transfer, and gait training   Assessment/Plan    PT Assessment Patient needs continued PT services  PT Problem List Decreased strength;Decreased balance;Decreased mobility;Decreased coordination;Decreased knowledge of use of DME;Decreased knowledge of precautions       PT Treatment Interventions DME instruction;Gait training;Stair training;Functional mobility training;Therapeutic activities;Therapeutic exercise;Balance training;Neuromuscular re-education;Patient/family education    PT Goals (Current goals can be found in the Care Plan section)  Acute Rehab PT Goals Patient Stated Goal: to improve ambulation PT Goal Formulation: With patient Time For Goal Achievement: 12/24/18 Potential to Achieve Goals: Good    Frequency 7X/week   Barriers to discharge        Co-evaluation               AM-PAC PT "6 Clicks" Mobility  Outcome Measure Help needed turning from your back to your side while in a flat bed without using bedrails?: A Lot Help needed moving from lying on your back to sitting on the side of a flat bed without using bedrails?:  A Lot Help needed moving to and from a bed to a chair (including a wheelchair)?: A Lot Help needed standing up from a chair using your arms (e.g., wheelchair or bedside chair)?: A Lot Help needed to walk in hospital room?: A Lot Help needed climbing 3-5 steps with a railing? : Total 6 Click Score: 11    End of Session Equipment Utilized During Treatment: Gait belt Activity Tolerance: Patient tolerated treatment well Patient left: in chair;with call bell/phone within reach;with chair alarm set;with family/visitor present;Other (comment)(B heels floating via pillow; R UE elevated on pillow) Nurse Communication: Mobility status;Precautions PT Visit Diagnosis: Other abnormalities of gait and mobility (R26.89);Muscle weakness (generalized) (M62.81);History  of falling (Z91.81);Difficulty in walking, not elsewhere classified (R26.2);Hemiplegia and hemiparesis Hemiplegia - Right/Left: Right Hemiplegia - dominant/non-dominant: Dominant Hemiplegia - caused by: Cerebral infarction    Time: [id number] PT Time Calculation (min) (ACUTE ONLY): 64 min   Charges:   PT Evaluation $PT Eval Low Complexity: 1 Low PT Treatments $Gait Training: 8-22 mins $Therapeutic Activity: 23-37 mins        Leitha Bleak, PT 12/10/18, 2:11 PM [phone]

## 2018-12-10 NOTE — ED Notes (Addendum)
Pt return from ct scan.   Pt has slurred speech   No headache.  nsr on monitor.  Family with pt.

## 2018-12-10 NOTE — ED Triage Notes (Signed)
Patient fell and hit head at approx 2130 tonight (knee gave out). Patient now with slurred speech and having trouble getting her thoughts together.

## 2018-12-10 NOTE — Consult Note (Signed)
Reason for Consult: R sided weakness and dysarthria  Referring Physician: Dr. Vianne Bulls   CC: R sided weakness and dysarthria   HPI: Meghan Dougherty is an 78 y.o. female  with a known history of type 2 diabetes mellitus, breast cancer, hypertension, dyslipidemia and hypothyroidism, presented to the emergency room with onset of slurred speech that started at 4 PM on 12/09/2018, with right facial droop and mild weakness on the right side.  Pt is on ASA 81mg  daily. 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   Past Medical History:  Diagnosis Date  . Borderline diabetes   . Breast cancer, right ([place]) 11/2016   invasive mammary carcinoma, Lumpectomy and rad tx's.   . Colon polyp   . Diabetes mellitus without complication ([place])    type 2  . Hyperlipemia   . Hypertension   . Hyperthyroidism   . Hypothyroidism   . Insomnia   . Osteopenia   . Personal history of radiation therapy 2018   right breast cancer    Past Surgical History:  Procedure Laterality Date  . ABDOMINAL HYSTERECTOMY     oophorectomy  . APPENDECTOMY    . BREAST BIOPSY Left    neg core  . BREAST BIOPSY Right    neg  core  . BREAST BIOPSY Right 11/2016   invasive mammary carcinoma, [place]  . BREAST BIOPSY Right 11/2016   benign, stero  . BREAST LUMPECTOMY Right 12/2016   invasive mammary carcinoma  Negative margins  . CATARACT EXTRACTION W/ INTRAOCULAR LENS IMPLANT    . CHOLECYSTECTOMY    . COLONOSCOPY    . COLONOSCOPY WITH PROPOFOL N/A 01/15/2018   Procedure: COLONOSCOPY WITH PROPOFOL;  Surgeon: Toledo, Benay Pike, MD;  Location: [facility] END[facility]COPY;  Service: Gastroenterology;  Laterality: N/A;  . EYE SURGERY     bilateral cataracts  . OOPHORECTOMY    . PARTIAL MASTECTOMY WITH NEEDLE LOCALIZATION Right 01/04/2017   Procedure: PARTIAL MASTECTOMY WITH NEEDLE LOCALIZATION;  Surgeon: Leonie Green, MD;  Location: [facility] ORS;  Service: General;  Laterality: Right;  . SENTINEL NODE BIOPSY Right 01/04/2017    Procedure: SENTINEL NODE BIOPSY;  Surgeon: Leonie Green, MD;  Location: [facility] ORS;  Service: General;  Laterality: Right;  . THYROID SURGERY     total thyroidectomy  . TONSILLECTOMY    . TOTAL THYROIDECTOMY      Family History  Problem Relation Age of Onset  . Breast cancer Sister 71  . Lung cancer Sister   . Diabetes Sister   . Breast cancer Maternal Aunt   . Diabetes Mother   . Diabetes Father   . Diabetes Brother   . Diabetes Sister   . Diabetes Sister   . Diabetes Brother     Social History:  reports that she has never smoked. She has never used smokeless tobacco. She reports current alcohol use. She reports that she does not use drugs.  Allergies  Allergen Reactions  . Levaquin [Levofloxacin] Rash  . Penicillins Rash    Has patient had a PCN reaction causing immediate rash, facial/tongue/throat swelling, SOB or lightheadedness with hypotension: No Has patient had a PCN reaction causing severe rash involving mucus membranes or skin necrosis: No Has patient had a PCN reaction that required hospitalization: No Has patient had a PCN reaction occurring within the last 10 years: No If all of the above answers are "NO", then may proceed with Cephalosporin use.   . Trazodone Other (See Comments)    Headache and  nightmares    Medications: I have reviewed the patient's current medications.  ROS: History obtained from the patient  General ROS: negative for - chills, fatigue, fever, night sweats, weight gain or weight loss Psychological ROS: negative for - behavioral disorder, hallucinations, memory difficulties, mood swings or suicidal ideation Ophthalmic ROS: negative for - blurry vision, double vision, eye pain or loss of vision ENT ROS: negative for - epistaxis, nasal discharge, oral lesions, sore throat, tinnitus or vertigo Allergy and Immunology ROS: negative for - hives or itchy/watery eyes Hematological and Lymphatic ROS: negative for - bleeding problems,  bruising or swollen lymph nodes Endocrine ROS: negative for - galactorrhea, hair pattern changes, polydipsia/polyuria or temperature intolerance Respiratory ROS: negative for - cough, hemoptysis, shortness of breath or wheezing Cardiovascular ROS: negative for - chest pain, dyspnea on exertion, edema or irregular heartbeat Gastrointestinal ROS: negative for - abdominal pain, diarrhea, hematemesis, nausea/vomiting or stool incontinence Genito-Urinary ROS: negative for - dysuria, hematuria, incontinence or urinary frequency/urgency Musculoskeletal ROS: negative for - joint swelling or muscular weakness Neurological ROS: as noted in HPI Dermatological ROS: negative for rash and skin lesion changes  Physical Examination: Blood pressure 132/68, pulse 69, temperature 98.4 F (36.9 C), temperature source Oral, resp. rate 18, height 5' (1.524 m), weight 73.4 kg, SpO2 94 %.   Neurological Examination   Mental Status: Alert, oriented, thought content appropriate.  Dysarthria with occasional word finding difficulty.  Cranial Nerves: II: Discs flat bilaterally; Visual fields grossly normal, pupils equal, round, reactive to light and accommodation III,IV, VI: ptosis not present, extra-ocular motions intact bilaterally V,VII: R facial droop  VIII: hearing normal bilaterally IX,X: gag reflex present XI: bilateral shoulder shrug XII: midline tongue extension Motor: Right : Upper extremity   4/5    Left:     Upper extremity   5/5  Lower extremity   4+/5     Lower extremity   5/5 Tone and bulk:normal tone throughout; no atrophy noted Sensory: Pinprick and light touch intact throughout, bilaterally Deep Tendon Reflexes: 1+ and symmetric throughout Plantars: Right: downgoing   Left: downgoing Cerebellar: normal finger-to-nose Gait: not tested      Laboratory Studies:   Basic Metabolic Panel: Recent Labs  Lab 12/10/18 0034  NA 132*  K 3.7  CL 96*  CO2 23  GLUCOSE 133*  BUN 15   CREATININE 0.89  CALCIUM 9.3    Liver Function Tests: Recent Labs  Lab 12/10/18 0034  AST 31  ALT 21  ALKPHOS 76  BILITOT 0.6  PROT 7.4  ALBUMIN 4.3   No results for input(s): LIPASE, AMYLASE in the last 168 hours. No results for input(s): AMMONIA in the last 168 hours.  CBC: Recent Labs  Lab 12/10/18 0034  WBC 9.5  NEUTROABS 6.6  HGB 13.8  HCT 50.5*  MCV 118.5*  PLT 300    Cardiac Enzymes: No results for input(s): CKTOTAL, CKMB, CKMBINDEX, TROPONINI in the last 168 hours.  BNP: Invalid input(s): POCBNP  CBG: No results for input(s): GLUCAP in the last 168 hours.  Microbiology: Results for orders placed or performed during the hospital encounter of 12/10/18  SARS Coronavirus 2 [facility] order, Performed in [facility] hospital lab) Nasopharyngeal Nasopharyngeal Swab     Status: None   Collection Time: 12/10/18 12:34 AM   Specimen: Nasopharyngeal Swab  Result Value Ref Range Status   SARS Coronavirus 2 NEGATIVE NEGATIVE Final    Comment: (NOTE) If result is NEGATIVE SARS-CoV-2 target nucleic acids are NOT DETECTED. The SARS-CoV-2  RNA is generally detectable in upper and lower  respiratory specimens during the acute phase of infection. The lowest  concentration of SARS-CoV-2 viral copies this assay can detect is 250  copies / mL. A negative result does not preclude SARS-CoV-2 infection  and should not be used as the sole basis for treatment or other  patient management decisions.  A negative result may occur with  improper specimen collection / handling, submission of specimen other  than nasopharyngeal swab, presence of viral mutation(s) within the  areas targeted by this assay, and inadequate number of viral copies  (<250 copies / mL). A negative result must be combined with clinical  observations, patient history, and epidemiological information. If result is POSITIVE SARS-CoV-2 target nucleic acids are DETECTED. The SARS-CoV-2 RNA is generally  detectable in upper and lower  respiratory specimens dur ing the acute phase of infection.  Positive  results are indicative of active infection with SARS-CoV-2.  Clinical  correlation with patient history and other diagnostic information is  necessary to determine patient infection status.  Positive results do  not rule out bacterial infection or co-infection with other viruses. If result is PRESUMPTIVE POSTIVE SARS-CoV-2 nucleic acids MAY BE PRESENT.   A presumptive positive result was obtained on the submitted specimen  and confirmed on repeat testing.  While 2019 novel coronavirus  (SARS-CoV-2) nucleic acids may be present in the submitted sample  additional confirmatory testing may be necessary for epidemiological  and / or clinical management purposes  to differentiate between  SARS-CoV-2 and other Sarbecovirus currently known to infect humans.  If clinically indicated additional testing with an alternate test  methodology [phone]) is advised. The SARS-CoV-2 RNA is generally  detectable in upper and lower respiratory sp ecimens during the acute  phase of infection. The expected result is Negative. Fact Sheet for Patients:  StrictlyIdeas.no Fact Sheet for Healthcare Providers: BankingDealers.co.za This test is not yet approved or cleared by the [place] FDA and has been authorized for detection and/or diagnosis of SARS-CoV-2 by FDA under an Emergency Use Authorization (EUA).  This EUA will remain in effect (meaning this test can be used) for the duration of the COVID-19 declaration under Section 564(b)(1) of the Act, 21 U.S.C. section 360bbb-3(b)(1), unless the authorization is terminated or revoked sooner. Performed at [facility], [address] [place], [place] [address]     Coagulation Studies: Recent Labs    12/10/18 0034  LABPROT 12.6  INR 1.0    Urinalysis: No results for input(s): COLORURINE,  LABSPEC, PHURINE, GLUCOSEU, HGBUR, BILIRUBINUR, KETONESUR, PROTEINUR, UROBILINOGEN, NITRITE, LEUKOCYTESUR in the last 168 hours.  Invalid input(s): APPERANCEUR  Lipid Panel:     Component Value Date/Time   CHOL 193 12/10/2018 0505   TRIG 131 12/10/2018 0505   HDL 65 12/10/2018 0505   CHOLHDL 3.0 12/10/2018 0505   VLDL 26 12/10/2018 0505   LDLCALC 102 (H) 12/10/2018 0505    HgbA1C:  Lab Results  Component Value Date   HGBA1C 6.1 (H) 12/10/2018    Urine Drug Screen:  No results found for: LABOPIA, COCAINSCRNUR, LABBENZ, AMPHETMU, THCU, LABBARB  Alcohol Level: No results for input(s): ETH in the last 168 hours.  Other results: EKG: normal EKG, normal sinus rhythm, unchanged from previous tracings.  Imaging: Ct Angio Head W Or Wo Contrast  Result Date: 12/10/2018 CLINICAL DATA:  Initial evaluation for acute stroke. Slurred speech. EXAM: CT ANGIOGRAPHY HEAD AND NECK CT PERFUSION BRAIN TECHNIQUE: Multidetector CT imaging of the head and  neck was performed using the standard protocol during bolus administration of intravenous contrast. Multiplanar CT image reconstructions and MIPs were obtained to evaluate the vascular anatomy. Carotid stenosis measurements (when applicable) are obtained utilizing NASCET criteria, using the distal internal carotid diameter as the denominator. Multiphase CT imaging of the brain was performed following IV bolus contrast injection. Subsequent parametric perfusion maps were calculated using RAPID software. CONTRAST:  134mL OMNIPAQUE IOHEXOL 350 MG/ML SOLN COMPARISON:  Prior CT from earlier the same day. FINDINGS: CTA NECK FINDINGS Aortic arch: Visualized aortic arch normal in caliber with normal 3 vessel morphology. Mild atheromatous plaque along the undersurface of the arch and about the origin of the great vessels without hemodynamically significant stenosis. Visualized subclavian arteries widely patent. Right carotid system: Right CCA patent from its origin to  the bifurcation without stenosis. Mild calcified plaque about the right bifurcation/proximal right ICA without hemodynamically significant stenosis. Right ICA widely patent distally to the skull base without stenosis, dissection or occlusion. Left carotid system: Left CCA patent from its origin to the bifurcation without stenosis. Bulky calcified plaque about the left bifurcation/proximal left ICA with associated mild stenosis of up to approximately 35% by NASCET criteria. Left ICA widely patent distally to the skull base without stenosis, dissection, or occlusion. Vertebral arteries: Both vertebral arteries arise from the subclavian arteries. Right vertebral artery dominant. Vertebral arteries widely patent within the neck without stenosis, dissection, or occlusion. Skeleton: No acute osseous finding. No discrete lytic or blastic osseous lesions. Moderate cervical spondylolysis noted at C3-4 through C6-7. Other neck: No other acute soft tissue abnormality within the neck. Thyroid appears to be absent. Upper chest: Visualized upper chest demonstrates no acute finding. Scattered pleuroparenchymal thickening with linear scarring noted within the right greater than left upper lobes. Review of the MIP images confirms the above findings CTA HEAD FINDINGS Anterior circulation: Petrous segments widely patent bilaterally. Mild atheromatous plaque within the cavernous/supraclinoid ICAs without hemodynamically significant stenosis. A1 segments widely patent. Normal anterior communicating artery. Anterior cerebral arteries widely patent to their distal aspects without stenosis. No M1 stenosis or occlusion. Normal MCA bifurcations. Distal MCA branches well perfused and symmetric. Posterior circulation: Vertebral arteries widely patent to the vertebrobasilar junction without stenosis. Right vertebral artery dominant. Posteroinferior cerebral arteries patent bilaterally. Short fenestration involving the proximal basilar artery  noted. Basilar widely patent to its distal aspect without stenosis. Superior cerebellar and posterior cerebral arteries widely patent bilaterally. Venous sinuses: Patent. Anatomic variants: None significant. Review of the MIP images confirms the above findings CT Brain Perfusion Findings: ASPECTS: 10 CBF (<30%) Volume: 61mL Perfusion (Tmax>6.0s) volume: 78mL Mismatch Volume: 60mL Infarction Location:Negative CT perfusion with no evidence for core infarct or perfusion abnormality. IMPRESSION: 1. Negative CTA of the head and neck with no evidence for large vessel occlusion. 2. Negative CT perfusion, with no evidence for acute core infarct or perfusion abnormality. 3. Mild atherosclerotic change about the left carotid bifurcation with associated mild 35% stenosis. 4. No other hemodynamically significant or correctable stenosis seen about the major arterial vasculature of the head and neck. A telephone call to the ordering clinician, Dr. Ruffin Frederick was made at 2:45 a.m. on 12/10/2018. A message was left with the clinician conveying these results. Electronically Signed   By: Jeannine Boga M.D.   On: 12/10/2018 02:51   Dg Chest 2 View  Result Date: 12/10/2018 CLINICAL DATA:  TIA. EXAM: CHEST - 2 VIEW COMPARISON:  None. FINDINGS: Heart size is normal. Chronic interstitial coarsening is noted. There is  no edema or effusion. No focal airspace disease is present. Chronic scarring is noted at the left base. Degenerative changes are noted in the thoracic spine. IMPRESSION: 1. No acute cardiopulmonary disease. 2. Chronic changes of COPD. Electronically Signed   By: San Morelle M.D.   On: 12/10/2018 04:09   Ct Angio Neck W Or Wo Contrast  Result Date: 12/10/2018 CLINICAL DATA:  Initial evaluation for acute stroke. Slurred speech. EXAM: CT ANGIOGRAPHY HEAD AND NECK CT PERFUSION BRAIN TECHNIQUE: Multidetector CT imaging of the head and neck was performed using the standard protocol during bolus administration  of intravenous contrast. Multiplanar CT image reconstructions and MIPs were obtained to evaluate the vascular anatomy. Carotid stenosis measurements (when applicable) are obtained utilizing NASCET criteria, using the distal internal carotid diameter as the denominator. Multiphase CT imaging of the brain was performed following IV bolus contrast injection. Subsequent parametric perfusion maps were calculated using RAPID software. CONTRAST:  129mL OMNIPAQUE IOHEXOL 350 MG/ML SOLN COMPARISON:  Prior CT from earlier the same day. FINDINGS: CTA NECK FINDINGS Aortic arch: Visualized aortic arch normal in caliber with normal 3 vessel morphology. Mild atheromatous plaque along the undersurface of the arch and about the origin of the great vessels without hemodynamically significant stenosis. Visualized subclavian arteries widely patent. Right carotid system: Right CCA patent from its origin to the bifurcation without stenosis. Mild calcified plaque about the right bifurcation/proximal right ICA without hemodynamically significant stenosis. Right ICA widely patent distally to the skull base without stenosis, dissection or occlusion. Left carotid system: Left CCA patent from its origin to the bifurcation without stenosis. Bulky calcified plaque about the left bifurcation/proximal left ICA with associated mild stenosis of up to approximately 35% by NASCET criteria. Left ICA widely patent distally to the skull base without stenosis, dissection, or occlusion. Vertebral arteries: Both vertebral arteries arise from the subclavian arteries. Right vertebral artery dominant. Vertebral arteries widely patent within the neck without stenosis, dissection, or occlusion. Skeleton: No acute osseous finding. No discrete lytic or blastic osseous lesions. Moderate cervical spondylolysis noted at C3-4 through C6-7. Other neck: No other acute soft tissue abnormality within the neck. Thyroid appears to be absent. Upper chest: Visualized upper  chest demonstrates no acute finding. Scattered pleuroparenchymal thickening with linear scarring noted within the right greater than left upper lobes. Review of the MIP images confirms the above findings CTA HEAD FINDINGS Anterior circulation: Petrous segments widely patent bilaterally. Mild atheromatous plaque within the cavernous/supraclinoid ICAs without hemodynamically significant stenosis. A1 segments widely patent. Normal anterior communicating artery. Anterior cerebral arteries widely patent to their distal aspects without stenosis. No M1 stenosis or occlusion. Normal MCA bifurcations. Distal MCA branches well perfused and symmetric. Posterior circulation: Vertebral arteries widely patent to the vertebrobasilar junction without stenosis. Right vertebral artery dominant. Posteroinferior cerebral arteries patent bilaterally. 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 Basilar widely patent to its distal aspect without stenosis. Superior cerebellar and posterior cerebral arteries widely patent bilaterally. Venous sinuses: Patent. Anatomic variants: None significant. Review of the MIP images confirms the above findings CT Brain Perfusion Findings: ASPECTS: 10 CBF (<30%) Volume: 2mL Perfusion (Tmax>6.0s) volume: 38mL Mismatch Volume: 52mL Infarction Location:Negative CT perfusion with no evidence for core infarct or perfusion abnormality. IMPRESSION: 1. Negative CTA of the head and neck with no evidence for large vessel occlusion. 2. Negative CT perfusion, with no evidence for acute core infarct or perfusion abnormality. 3. Mild atherosclerotic change about the left carotid bifurcation with associated mild 35% stenosis. 4. No other  hemodynamically significant or correctable stenosis seen about the major arterial vasculature of the head and neck. A telephone call to the ordering clinician, Dr. Ruffin Frederick was made at 2:45 a.m. on 12/10/2018. A message was left with the clinician conveying these  results. Electronically Signed   By: Jeannine Boga M.D.   On: 12/10/2018 02:51   Mr Brain Wo Contrast  Result Date: 12/10/2018 CLINICAL DATA:  TIA, initial exam. Acute onset of slurred speech and difficulty with word finding beginning at 9 o'clock p.m. last night. EXAM: MRI HEAD WITHOUT CONTRAST TECHNIQUE: Multiplanar, multiecho pulse sequences of the brain and surrounding structures were obtained without intravenous contrast. COMPARISON:  CTA head and neck and CT perfusion 12/10/2018 FINDINGS: Brain: The diffusion-weighted images demonstrate restricted diffusion consistent with an acute nonhemorrhagic infarct in the posterior limb of the left internal capsule. The area of infarction there is linear measuring 11 mm. Moderate confluent periventricular and scattered subcortical T2 hyperintensities are present bilaterally. Dilated perivascular spaces are present throughout the basal ganglia. The ventricles are proportionate to the degree of atrophy. Corpus callosum is normal. No significant extraaxial fluid collection is present. The internal auditory canals are within normal limits. The brainstem and cerebellum are within normal limits. Vascular: Flow is present in the major intracranial arteries. Skull and upper cervical spine: The craniocervical junction is normal. Upper cervical spine is within normal limits. Marrow signal is unremarkable. Sinuses/Orbits: The paranasal sinuses and mastoid air cells are clear. Bilateral lens replacements are noted. Globes and orbits are otherwise unremarkable. IMPRESSION: 1. 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 2. Moderate diffuse confluent periventricular and scattered subcortical T2 hyperintensities bilaterally consistent with chronic microvascular ischemic change. Electronically Signed   By: San Morelle M.D.   On: 12/10/2018 05:13   Ct Cerebral Perfusion W Contrast  Result Date:  12/10/2018 CLINICAL DATA:  Initial evaluation for acute stroke. Slurred speech. EXAM: CT ANGIOGRAPHY HEAD AND NECK CT PERFUSION BRAIN TECHNIQUE: Multidetector CT imaging of the head and neck was performed using the standard protocol during bolus administration of intravenous contrast. Multiplanar CT image reconstructions and MIPs were obtained to evaluate the vascular anatomy. Carotid stenosis measurements (when applicable) are obtained utilizing NASCET criteria, using the distal internal carotid diameter as the denominator. Multiphase CT imaging of the brain was performed following IV bolus contrast injection. Subsequent parametric perfusion maps were calculated using RAPID software. CONTRAST:  180mL OMNIPAQUE IOHEXOL 350 MG/ML SOLN COMPARISON:  Prior CT from earlier the same day. FINDINGS: CTA NECK FINDINGS Aortic arch: Visualized aortic arch normal in caliber with normal 3 vessel morphology. Mild atheromatous plaque along the undersurface of the arch and about the origin of the great vessels without hemodynamically significant stenosis. Visualized subclavian arteries widely patent. Right carotid system: Right CCA patent from its origin to the bifurcation without stenosis. Mild calcified plaque about the right bifurcation/proximal right ICA without hemodynamically significant stenosis. Right ICA widely patent distally to the skull base without stenosis, dissection or occlusion. Left carotid system: Left CCA patent from its origin to the bifurcation without stenosis. Bulky calcified plaque about the left bifurcation/proximal left ICA with associated mild stenosis of up to approximately 35% by NASCET criteria. Left ICA widely patent distally to the skull base without stenosis, dissection, or occlusion. Vertebral arteries: Both vertebral arteries arise from the subclavian arteries. Right vertebral artery dominant. Vertebral arteries widely patent within the neck without stenosis, dissection, or occlusion. Skeleton: No  acute osseous finding. No discrete  lytic or blastic osseous lesions. Moderate cervical spondylolysis noted at C3-4 through C6-7. Other neck: No other acute soft tissue abnormality within the neck. Thyroid appears to be absent. Upper chest: Visualized upper chest demonstrates no acute finding. Scattered pleuroparenchymal thickening with linear scarring noted within the right greater than left upper lobes. Review of the MIP images confirms the above findings CTA HEAD FINDINGS Anterior circulation: Petrous segments widely patent bilaterally. Mild atheromatous plaque within the cavernous/supraclinoid ICAs without hemodynamically significant stenosis. A1 segments widely patent. Normal anterior communicating artery. Anterior cerebral arteries widely patent to their distal aspects without stenosis. No M1 stenosis or occlusion. Normal MCA bifurcations. Distal MCA branches well perfused and symmetric. Posterior circulation: Vertebral arteries widely patent to the vertebrobasilar junction without stenosis. Right vertebral artery dominant. Posteroinferior cerebral arteries patent bilaterally. 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 Basilar widely patent to its distal aspect without stenosis. Superior cerebellar and posterior cerebral arteries widely patent bilaterally. Venous sinuses: Patent. Anatomic variants: None significant. Review of the MIP images confirms the above findings CT Brain Perfusion Findings: ASPECTS: 10 CBF (<30%) Volume: 8mL Perfusion (Tmax>6.0s) volume: 1mL Mismatch Volume: 84mL Infarction Location:Negative CT perfusion with no evidence for core infarct or perfusion abnormality. IMPRESSION: 1. Negative CTA of the head and neck with no evidence for large vessel occlusion. 2. Negative CT perfusion, with no evidence for acute core infarct or perfusion abnormality. 3. Mild atherosclerotic change about the left carotid bifurcation with associated mild 35% stenosis. 4. No other  hemodynamically significant or correctable stenosis seen about the major arterial vasculature of the head and neck. A telephone call to the ordering clinician, Dr. Ruffin Frederick was made at 2:45 a.m. on 12/10/2018. A message was left with the clinician conveying these results. Electronically Signed   By: Jeannine Boga M.D.   On: 12/10/2018 02:51   Ct Head Code Stroke Wo Contrast  Result Date: 12/10/2018 CLINICAL DATA:  Code stroke. Initial evaluation for acute slurred speech. EXAM: CT HEAD WITHOUT CONTRAST TECHNIQUE: Contiguous axial images were obtained from the base of the skull through the vertex without intravenous contrast. COMPARISON:  None available. FINDINGS: Brain: Moderate age-related cerebral atrophy. Patchy and confluent hypodensity involving the periventricular deep white matter both cerebral hemispheres most consistent with chronic microvascular ischemic disease, moderately advanced. No acute intracranial hemorrhage. No acute large vessel territory infarct. No mass lesion, midline shift or mass effect. No hydrocephalus. No extra-axial fluid collection. Vascular: No hyperdense vessel. Calcified atherosclerosis present at skull base. Skull: Scalp soft tissues demonstrate no acute finding. Calvarium intact. Sinuses/Orbits: Globes and orbital soft tissues within normal limits. Visualized paranasal sinuses are clear. No mastoid effusion. Other: None. ASPECTS [facility] Stroke Program Early CT Score) - Ganglionic level infarction (caudate, lentiform nuclei, internal capsule, insula, M1-M3 cortex): 7 - Supraganglionic infarction (M4-M6 cortex): 3 Total score (0-10 with 10 being normal): 10 IMPRESSION: 1. No acute intracranial infarct or other abnormality identified. 2. ASPECTS is 10. 3. Moderately advanced cerebral atrophy with chronic microvascular ischemic disease. Critical Value/emergent results were called by telephone at the time of interpretation on 12/10/2018 at 12:33 am to Dr. Karma Greaser, who  verbally acknowledged these results. Electronically Signed   By: Jeannine Boga M.D.   On: 12/10/2018 00:38     Assessment/Plan:  78 y.o. female  with a known history of type 2 diabetes mellitus, breast cancer, hypertension, dyslipidemia and hypothyroidism, presented to the emergency room with onset of slurred speech that started at 4 PM on 12/09/2018, with right facial droop  and mild weakness on the right side.  Pt is on ASA 81mg  daily. 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 She also has chronic small vessel changes consistent with chronic small vessel disease - CTA head and neck no vessel stenosis and patent carotids - Was on ASA 81 mg at home, ASA 325 when able to swallow - Will d/c plavix as I don't see reason for dual anti platelet therapy due to normal intracranial vasculature - pt/ot - Appreciate speech pathology assistance  12/10/2018, 11:53 AM

## 2018-12-10 NOTE — Evaluation (Signed)
Occupational Therapy Evaluation Patient Details Name: Meghan Dougherty MRN: [id number] DOB: 12/22/1940 Today's Date: 12/10/2018    History of Present Illness Pt. is a 78 y.o. female who was admitted to [facility] with Right sided facial weakness. Imaging revealed Acute nonhemorrhagic linear white matter infarct involving posterior limb of the Left Internal Capsule. As well as Chronic Microvascular Ischemic changes. Pt. PMHx includes: DM, Breast CA, Hyperlipidemia, HTN, Hyperthyroidim, Insomnia, Osteopenia.   Clinical Impression   Pt. presents with RUE weakness, decreased coordination, expressive impairments, limited activity tolerance, and limited functional mobility which limits the ability to complete basic ADL and IADL functioning. Pt. was independent with ADLs, and IADLs prior to hospitalization. Pt. was driving, independent with meal preparation, and independent with medication management. Pt. reported fatigue upon arrival, and reports hoping to have a nap before her son arrives. Pt. education was provided about opportunities to engage her RUE during ADLs, and IADLs, ROM, and positioning. Pt. will benefit from OT services for ADL training, A/E training, neuromuscular re-education, and pt. education about positioning, home modification, and DME. Pt. ould benefit from CIR with follow-up OT services upon discharge.     Follow Up Recommendations  CIR    Equipment Recommendations  3 in 1 bedside commode    Recommendations for Other Services       Precautions / Restrictions Precautions Precautions: None Restrictions Weight Bearing Restrictions: No      Mobility Bed Mobility    Deferred              Transfers    Degferred                  Balance                                           ADL either performed or assessed with clinical judgement   ADL Overall ADL's : Needs assistance/impaired Eating/Feeding: NPO   Grooming: Minimal assistance    Upper Body Bathing: Minimal assistance   Lower Body Bathing: Moderate assistance   Upper Body Dressing : Minimal assistance   Lower Body Dressing: Moderate assistance                       Vision Baseline Vision/History: Wears glasses Wears Glasses: Reading only Patient Visual Report: No change from baseline;Other (comment)(Reports that her left eye is tired/fatigued)       Perception     Praxis      Pertinent Vitals/Pain Pain Assessment: No/denies pain     Hand Dominance Right   Extremity/Trunk Assessment Upper Extremity Assessment Upper Extremity Assessment: RUE deficits/detail RUE Deficits / Details: RIght shoulder flexion, and abduction 3-/5, elbow flexion, extension, wrist extension 3+/5, decreased grip strength, RUE Sensation: WNL RUE Coordination: decreased fine motor;decreased gross motor   Lower Extremity Assessment Lower Extremity Assessment: Defer to PT evaluation       Communication Communication Communication: Expressive difficulties   Cognition Arousal/Alertness: Awake/alert Behavior During Therapy: WFL for tasks assessed/performed Overall Cognitive Status: Within Functional Limits for tasks assessed                                     General Comments       Exercises     Shoulder Instructions      Home Living Family/patient  expects to be discharged to:: Private residence Living Arrangements: Spouse/significant other Available Help at Discharge: Family Type of Home: House Home Access: Stairs to enter [employer] of Steps: 2 in the back Entrance Stairs-Rails: Right Home Layout: One level     Bathroom Shower/Tub: Tub/shower unit         Home Equipment: None          Prior Functioning/Environment Level of Independence: Independent        Comments: Independent with ADLs, and IADLs medication management, driving.        OT Problem List: Decreased strength;Pain;Decreased  coordination;Impaired UE functional use      OT Treatment/Interventions: Self-care/ADL training;Therapeutic exercise;Neuromuscular education;Patient/family education;DME and/or AE instruction;Therapeutic activities    OT Goals(Current goals can be found in the care plan section) Acute Rehab OT Goals Patient Stated Goal: To regain the use of her right hand OT Goal Formulation: With patient Potential to Achieve Goals: Good  OT Frequency: Min 3X/week   Barriers to D/C:            Co-evaluation              AM-PAC OT "6 Clicks" Daily Activity     Outcome Measure Help from another person eating meals?: (NPO) Help from another person taking care of personal grooming?: A Little Help from another person toileting, which includes using toliet, bedpan, or urinal?: A Little Help from another person bathing (including washing, rinsing, drying)?: A Lot Help from another person to put on and taking off regular upper body clothing?: A Little Help from another person to put on and taking off regular lower body clothing?: A Lot 6 Click Score: 13   End of Session    Activity Tolerance: Patient tolerated treatment well Patient left: in bed;with call bell/phone within reach;with bed alarm set  OT Visit Diagnosis: History of falling (Z91.81);Muscle weakness (generalized) (M62.81)                Time: [id number] OT Time Calculation (min): 25 min Charges:  OT General Charges $OT Visit: 1 Visit OT Evaluation $OT Eval Moderate Complexity: 1 Mod  Harrel Carina, MS, OTR/L   Harrel Carina 12/10/2018, 11:25 AM

## 2018-12-10 NOTE — ED Notes (Addendum)
Pt returned to room from MRI; assisted to room commode to void and ret to bed--stand by assist only; pt placed back on card monitor; denies c/o at present; MAEW with = grips; speech remains slurred with some expressive aphasia

## 2018-12-10 NOTE — Consult Note (Addendum)
[facility]rology Consult Services   Date of Service:   12/10/2018 00:19:19  Impression:     .  Left Hemispheric Infarct  Comments/Sign-Out: Meghan Dougherty is a 78 yo RH with a PMH of HTN, HLD, DM, breast cancer in remission who p/w slurred speech, comprehension difficulties and fall, due to right leg weakness. LNK per patient 16:00. On exam she has mild aphasia, right UE drift, dysarthria. CTH NAF. CTA head and neck, CTP are pending.  Addendum: CTA/CTP reviewed. NIR not indicated.   IMPRESSION: 1. Negative CTA of the head and neck with no evidence for large vessel occlusion. 2. Negative CT perfusion, with no evidence for acute core infarct or perfusion abnormality. 3. Mild atherosclerotic change about the left carotid bifurcation with associated mild 35% stenosis. 4. No other hemodynamically significant or correctable stenosis seen about the major arterial vasculature of the head and neck.   Mechanism of Stroke: Not Clear  Metrics: Last Known Well: 12/09/2018 16:00:00 [facility] Notification Time: 12/10/2018 00:19:19 Arrival Time: 12/10/2018 00:08:00 Stamp Time: 12/10/2018 00:19:19 Telephone Response Time: 12/10/2018 00:22:00 Time First Login Attempt: 12/10/2018 00:22:00 Video Start Time: 12/10/2018 00:30:20  Symptoms: fall, slurred speech NIHSS Start Assessment Time: 12/10/2018 00:30:20 Patient is not a candidate for Alteplase/Activase. Patient was not deemed candidate for Alteplase/Activase thrombolytics because of Last Well Known Above 4.5 Hours. Video End Time: 12/10/2018 00:55:00  CT head showed no acute hemorrhage or acute core infarct. CT head was reviewed.  Lower Likelihood of Large Vessel Occlusion but Following Stat Studies are Recommended  CTA Head and Neck. CT Perfusion.   ED Physician notified of diagnostic impression and management plan on 12/10/2018 00:59:16  Our recommendations are outlined  below.  Recommendations:     .  Activate Stroke Protocol Admission/Order Set     .  Stroke/Telemetry Floor     .  Neuro Checks     .  Bedside Swallow Eval     .  DVT Prophylaxis     .  IV Fluids, Normal Saline     .  Head of Bed 30 Degrees     .  Euglycemia and Avoid Hyperthermia (PRN Acetaminophen)     .  ASA 325mg  in ED     .  Permissive HTN  Routine Consultation with [facility] for Follow up Care  Sign Out:     .  Discussed with Emergency Department Provider    ------------------------------------------------------------------------------  History of Present Illness: Patient is a 78 year old Female.  Patient was brought by private transportation with symptoms of fall, slurred speech  Meghan Dougherty is a 78 yo RH with a PMH of HTN, HLD, DM, breast cancer in remission who p/w slurred speech, comprehension difficulties and fall, due to right leg weakness. LNK per patient 16:00. She was speaking to a friend, she noticed she was having difficulty with her speech and thinking of words. Shortly after she was having difficulty ambulating due to right leg weakness. EMS was called, negative exam per report. Patient continued to feel unwell. She subsequently fell, due to her right leg giving out. She continues to feel her speech and thinking is slowed.  Last seen normal was beyond 4.5 hours of presentation. There is no history of hemorrhagic complications or intracranial hemorrhage. There is no history of Recent Anticoagulants.  Past Medical History:     . Hypertension     . Diabetes Mellitus     . Hyperlipidemia  Anticoagulant use:  No  Antiplatelet use: ASA  Examination: BP(163/80), 1A: Level of Consciousness - Arouses to minor stimulation + 1 1B: Ask Month and Age - Both Questions Right + 0 1C: Blink Eyes & Squeeze Hands - Performs Both Tasks + 0 2: Test Horizontal Extraocular Movements - Normal + 0 3: Test Visual Fields - No Visual Loss + 0 4: Test Facial Palsy  (Use Grimace if Obtunded) - Minor paralysis (flat nasolabial fold, smile asymmetry) + 1 5A: Test Left Arm Motor Drift - No Drift for 10 Seconds + 0 5B: Test Right Arm Motor Drift - Drift, but doesn't hit bed + 1 6A: Test Left Leg Motor Drift - No Drift for 5 Seconds + 0 6B: Test Right Leg Motor Drift - No Drift for 5 Seconds + 0 7: Test Limb Ataxia (FNF/Heel-Shin) - Ataxia in 1 Limb + 1 8: Test Sensation - Normal; No sensory loss + 0 9: Test Language/Aphasia - Mild-Moderate Aphasia: Some Obvious Changes, Without Significant Limitation + 1 10: Test Dysarthria - Mild-Moderate Dysarthria: Slurring but can be understood + 1 11: Test Extinction/Inattention - No abnormality + 0  NIHSS Score: 6  Patient/Family was informed the Neurology Consult would happen via TeleHealth consult by way of interactive audio and video telecommunications and consented to receiving care in this manner.   Due to the immediate potential for life-threatening deterioration due to underlying acute neurologic illness, I spent 35 minutes providing critical care. This time includes time for face to face visit via telemedicine, review of medical records, imaging studies and discussion of findings with providers, the patient and/or family.   Dr Ruffin Frederick   [facility] [phone]   Case [id number]

## 2018-12-10 NOTE — ED Notes (Addendum)
Pt assisted up to room commode, stand-by assist only, to void qs; pt with slurred speech and some expressive aphasia noted; reports weakness to rt leg but none in upper extremities; denies any c/o pain; per nursing notes, pt passed swallowing study earlier; upon admin of PO ASA with sip of water, pt begins coughing forcefully; Dr Jari Pigg notified of such; will keep pt NPO until further eval; pt reports last tetanus yr ago--will d/c order to admin

## 2018-12-10 NOTE — ED Notes (Signed)
Pt to MRI via stretcher accomp by EDT Eustaquio Maize

## 2018-12-10 NOTE — H&P (Signed)
[facility] NAME: Meghan Dougherty    MR#:  [id number]  DATE OF BIRTH:  [birth year]  DATE OF ADMISSION:  12/10/2018  PRIMARY CARE PHYSICIAN: Dion Body, MD   REQUESTING/REFERRING PHYSICIAN: Marjean Donna, MD  CHIEF COMPLAINT:   Chief Complaint  Patient presents with  . Code Stroke    HISTORY OF PRESENT ILLNESS:  Meghan Dougherty  is a 78 y.o. Caucasian female with a known history of type 2 diabetes mellitus, breast cancer, hypertension, dyslipidemia and hypothyroidism, presented to the emergency room with onset of slurred speech that started at 4 PM on 12/09/2018, with right facial droop and mild weakness on the right side.  She then had a fall around 9:30 PM when her right knee gave out when she was in the closet.  She denied any presyncope or syncope or head injury.  She had right knee skin tear due to her fall.  She was having mild expressive dysphasia which has improved later on.  No reported paresthesias or other focal muscle weakness.  Upon presentation to the emergency room, blood pressure was 154/76 and vital signs otherwise were within normal.  Labs revealed mild hyponatremia and hypochloremia and hemoconcentration.  COVID-19 test came back negative.  EKG showed normal sinus rhythm with rate of 76 with low voltage QRS.  Head and neck CTA revealed: 1. Negative CTA of the head and neck with no evidence for large vessel occlusion. 2. Negative CT perfusion, with no evidence for acute core infarct or perfusion abnormality. 3. Mild atherosclerotic change about the left carotid bifurcation with associated mild 35% stenosis. 4. No other hemodynamically significant or correctable stenosis seen about the major arterial vasculature of the head and neck.  I ordered a brain MRI that was done this evening and it showed: 1. 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 2.  Moderate diffuse confluent periventricular and scattered subcortical T2 hyperintensities bilaterally consistent with chronic microvascular ischemic change  The patient was given 4 baby aspirin and had a Tdap booster.  She will be admitted to a an observation medically monitored bed for further evaluation and management.  PAST MEDICAL HISTORY:   Past Medical History:  Diagnosis Date  . Borderline diabetes   . Breast cancer, right ([place]) 11/2016   invasive mammary carcinoma, Lumpectomy and rad tx's.   . Colon polyp   . Diabetes mellitus without complication ([place])    type 2  . Hyperlipemia   . Hypertension   . Hyperthyroidism   . Hypothyroidism   . Insomnia   . Osteopenia   . Personal history of radiation therapy 2018   right breast cancer    PAST SURGICAL HISTORY:   Past Surgical History:  Procedure Laterality Date  . ABDOMINAL HYSTERECTOMY     oophorectomy  . APPENDECTOMY    . BREAST BIOPSY Left    neg core  . BREAST BIOPSY Right    neg  core  . BREAST BIOPSY Right 11/2016   invasive mammary carcinoma, [place]  . BREAST BIOPSY Right 11/2016   benign, stero  . BREAST LUMPECTOMY Right 12/2016   invasive mammary carcinoma  Negative margins  . CATARACT EXTRACTION W/ INTRAOCULAR LENS IMPLANT    . CHOLECYSTECTOMY    . COLONOSCOPY    . COLONOSCOPY WITH PROPOFOL N/A 01/15/2018   Procedure: COLONOSCOPY WITH PROPOFOL;  Surgeon: Toledo, Benay Pike, MD;  Location: [facility] ENDOSCOPY;  Service: Gastroenterology;  Laterality:  N/A;  . EYE SURGERY     bilateral cataracts  . OOPHORECTOMY    . PARTIAL MASTECTOMY WITH NEEDLE LOCALIZATION Right 01/04/2017   Procedure: PARTIAL MASTECTOMY WITH NEEDLE LOCALIZATION;  Surgeon: Leonie Green, MD;  Location: [facility] ORS;  Service: General;  Laterality: Right;  . SENTINEL NODE BIOPSY Right 01/04/2017   Procedure: SENTINEL NODE BIOPSY;  Surgeon: Leonie Green, MD;  Location: [facility] ORS;  Service: General;  Laterality: Right;  . THYROID SURGERY      total thyroidectomy  . TONSILLECTOMY    . TOTAL THYROIDECTOMY      SOCIAL HISTORY:   Social History   Tobacco Use  . Smoking status: Never Smoker  . Smokeless tobacco: Never Used  Substance Use Topics  . Alcohol use: Yes    Comment: occassional    FAMILY HISTORY:   Family History  Problem Relation Age of Onset  . Breast cancer Sister 28  . Lung cancer Sister   . Diabetes Sister   . Breast cancer Maternal Aunt   . Diabetes Mother   . Diabetes Father   . Diabetes Brother   . Diabetes Sister   . Diabetes Sister   . Diabetes Brother     DRUG ALLERGIES:   Allergies  Allergen Reactions  . Levaquin [Levofloxacin] Rash  . Penicillins Rash    Has patient had a PCN reaction causing immediate rash, facial/tongue/throat swelling, SOB or lightheadedness with hypotension: No Has patient had a PCN reaction causing severe rash involving mucus membranes or skin necrosis: No Has patient had a PCN reaction that required hospitalization: No Has patient had a PCN reaction occurring within the last 10 years: No If all of the above answers are "NO", then may proceed with Cephalosporin use.   . Trazodone Other (See Comments)    Headache and nightmares    REVIEW OF SYSTEMS:   ROS As per history of present illness. All pertinent systems were reviewed above. Constitutional,  HEENT, cardiovascular, respiratory, GI, GU, musculoskeletal, neuro, psychiatric, endocrine,  integumentary and hematologic systems were reviewed and are otherwise  negative/unremarkable except for positive findings mentioned above in the HPI.   MEDICATIONS AT HOME:   Prior to Admission medications   Medication Sig Start Date End Date Taking? Authorizing Provider  acetaminophen (TYLENOL) 500 MG tablet Take 250-500 mg by mouth every 6 (six) hours as needed for mild pain.   Yes [provider]  amLODipine (NORVASC) 5 MG tablet Take 5 mg by mouth every evening. 09/20/16  Yes [provider]   aspirin EC 81 MG tablet Take 81 mg by mouth daily.   Yes [provider]  calcium-vitamin D (OSCAL WITH D) 500-200 MG-UNIT tablet Take 2 tablets by mouth daily.   Yes [provider]  fluticasone (FLONASE) 50 MCG/ACT nasal spray Place 1 spray into both nostrils daily as needed for allergies or rhinitis.   Yes [provider]  hydrochlorothiazide (HYDRODIURIL) 25 MG tablet Take 12.5 mg by mouth daily.  09/20/16  Yes [provider]  levothyroxine (SYNTHROID, LEVOTHROID) 50 MCG tablet Take 50 mcg by mouth daily before breakfast. 11/26/16  Yes [provider]  lisinopril (PRINIVIL,ZESTRIL) 40 MG tablet Take 40 mg by mouth every evening.  11/03/16  Yes [provider]  loperamide (IMODIUM A-D) 2 MG tablet Take 2 mg by mouth 4 (four) times daily as needed for diarrhea or loose stools.   Yes [provider]  metoprolol tartrate (LOPRESSOR) 100 MG tablet Take 100 mg by  mouth 2 (two) times daily. 09/14/16  Yes [provider]  Multiple Vitamin (MULTIVITAMIN WITH MINERALS) TABS tablet Take 2 tablets by mouth daily.   Yes [provider]  Omega-3 Fatty Acids (FISH OIL PO) Take 1 capsule by mouth daily.   Yes [provider]  omeprazole (PRILOSEC) 20 MG capsule Take 20 mg by mouth daily as needed (heartburn).   Yes [provider]  PARoxetine (PAXIL) 10 MG tablet Take 10 mg by mouth at bedtime. 09/20/16  Yes [provider]  pravastatin (PRAVACHOL) 10 MG tablet Take 10 mg by mouth every evening. 11/03/16  Yes [provider]  Probiotic Product (PROBIOTIC DAILY PO) Take 1 capsule by mouth daily.   Yes [provider]  zolpidem (AMBIEN) 5 MG tablet Take 2.5 mg by mouth at bedtime as needed for sleep. 11/20/16  Yes [provider]      VITAL SIGNS:  Blood pressure (!) 148/90, pulse 77, temperature 97.8 F (36.6 C), temperature source Oral, resp. rate (!) 23, height 5' (1.524 m),  weight 73.4 kg, SpO2 97 %.  PHYSICAL EXAMINATION:  Physical Exam  GENERAL:  78 y.o.-year-old Caucasian female patient lying in the bed with no acute distress.  EYES: Pupils equal, round, reactive to light and accommodation. No scleral icterus. Extraocular muscles intact.  HEENT: Head atraumatic, normocephalic. Oropharynx and nasopharynx clear.  NECK:  Supple, no jugular venous distention. No thyroid enlargement, no tenderness.  LUNGS: Normal breath sounds bilaterally, no wheezing, rales,rhonchi or crepitation. No use of accessory muscles of respiration.  CARDIOVASCULAR: Regular rate and rhythm, S1, S2 normal. No murmurs, rubs, or gallops.  ABDOMEN: Soft, nondistended, nontender. Bowel sounds present. No organomegaly or mass.  EXTREMITIES: No pedal edema, cyanosis, or clubbing.  NEUROLOGIC: Cranial nerves II through XII are intact except for dysarthria, expressive dysphasia and right facial droop. Muscle strength 3/5 in the right lower extremity compared to 5/5 in the lower extremity and 5/5 in both upper extremities.  Normal sensory exam to light touch.  Gait not checked.  PSYCHIATRIC: The patient is alert and oriented x 3.  Normal affect and good eye contact. SKIN: No obvious rash, lesion, or ulcer.   LABORATORY PANEL:   CBC Recent Labs  Lab 12/10/18 0034  WBC 9.5  HGB 13.8  HCT 50.5*  PLT 300   ------------------------------------------------------------------------------------------------------------------  Chemistries  Recent Labs  Lab 12/10/18 0034  NA 132*  K 3.7  CL 96*  CO2 23  GLUCOSE 133*  BUN 15  CREATININE 0.89  CALCIUM 9.3  AST 31  ALT 21  ALKPHOS 76  BILITOT 0.6   ------------------------------------------------------------------------------------------------------------------  Cardiac Enzymes No results for input(s): TROPONINI in the last 168  hours. ------------------------------------------------------------------------------------------------------------------  RADIOLOGY:  Ct Angio Head W Or Wo Contrast  Result Date: 12/10/2018 CLINICAL DATA:  Initial evaluation for acute stroke. Slurred speech. EXAM: CT ANGIOGRAPHY HEAD AND NECK CT PERFUSION BRAIN TECHNIQUE: Multidetector CT imaging of the head and neck was performed using the standard protocol during bolus administration of intravenous contrast. Multiplanar CT image reconstructions and MIPs were obtained to evaluate the vascular anatomy. Carotid stenosis measurements (when applicable) are obtained utilizing NASCET criteria, using the distal internal carotid diameter as the denominator. Multiphase CT imaging of the brain was performed following IV bolus contrast injection. Subsequent parametric perfusion maps were calculated using RAPID software. CONTRAST:  162mL OMNIPAQUE IOHEXOL 350 MG/ML SOLN COMPARISON:  Prior CT from earlier the same day. FINDINGS: CTA NECK FINDINGS Aortic arch: Visualized aortic arch normal in  caliber with normal 3 vessel morphology. Mild atheromatous plaque along the undersurface of the arch and about the origin of the great vessels without hemodynamically significant stenosis. Visualized subclavian arteries widely patent. Right carotid system: Right CCA patent from its origin to the bifurcation without stenosis. Mild calcified plaque about the right bifurcation/proximal right ICA without hemodynamically significant stenosis. Right ICA widely patent distally to the skull base without stenosis, dissection or occlusion. Left carotid system: Left CCA patent from its origin to the bifurcation without stenosis. Bulky calcified plaque about the left bifurcation/proximal left ICA with associated mild stenosis of up to approximately 35% by NASCET criteria. Left ICA widely patent distally to the skull base without stenosis, dissection, or occlusion. Vertebral arteries: Both  vertebral arteries arise from the subclavian arteries. Right vertebral artery dominant. Vertebral arteries widely patent within the neck without stenosis, dissection, or occlusion. Skeleton: No acute osseous finding. No discrete lytic or blastic osseous lesions. Moderate cervical spondylolysis noted at C3-4 through C6-7. Other neck: No other acute soft tissue abnormality within the neck. Thyroid appears to be absent. Upper chest: Visualized upper chest demonstrates no acute finding. Scattered pleuroparenchymal thickening with linear scarring noted within the right greater than left upper lobes. Review of the MIP images confirms the above findings CTA HEAD FINDINGS Anterior circulation: Petrous segments widely patent bilaterally. Mild atheromatous plaque within the cavernous/supraclinoid ICAs without hemodynamically significant stenosis. A1 segments widely patent. Normal anterior communicating artery. Anterior cerebral arteries widely patent to their distal aspects without stenosis. No M1 stenosis or occlusion. Normal MCA bifurcations. Distal MCA branches well perfused and symmetric. Posterior circulation: Vertebral arteries widely patent to the vertebrobasilar junction without stenosis. Right vertebral artery dominant. Posteroinferior cerebral arteries patent bilaterally. 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 Basilar widely patent to its distal aspect without stenosis. Superior cerebellar and posterior cerebral arteries widely patent bilaterally. Venous sinuses: Patent. Anatomic variants: None significant. Review of the MIP images confirms the above findings CT Brain Perfusion Findings: ASPECTS: 10 CBF (<30%) Volume: 52mL Perfusion (Tmax>6.0s) volume: 28mL Mismatch Volume: 69mL Infarction Location:Negative CT perfusion with no evidence for core infarct or perfusion abnormality. IMPRESSION: 1. Negative CTA of the head and neck with no evidence for large vessel occlusion. 2. Negative CT  perfusion, with no evidence for acute core infarct or perfusion abnormality. 3. Mild atherosclerotic change about the left carotid bifurcation with associated mild 35% stenosis. 4. No other hemodynamically significant or correctable stenosis seen about the major arterial vasculature of the head and neck. A telephone call to the ordering clinician, Dr. Ruffin Frederick was made at 2:45 a.m. on 12/10/2018. A message was left with the clinician conveying these results. Electronically Signed   By: Jeannine Boga M.D.   On: 12/10/2018 02:51   Ct Angio Neck W Or Wo Contrast  Result Date: 12/10/2018 CLINICAL DATA:  Initial evaluation for acute stroke. Slurred speech. EXAM: CT ANGIOGRAPHY HEAD AND NECK CT PERFUSION BRAIN TECHNIQUE: Multidetector CT imaging of the head and neck was performed using the standard protocol during bolus administration of intravenous contrast. Multiplanar CT image reconstructions and MIPs were obtained to evaluate the vascular anatomy. Carotid stenosis measurements (when applicable) are obtained utilizing NASCET criteria, using the distal internal carotid diameter as the denominator. Multiphase CT imaging of the brain was performed following IV bolus contrast injection. Subsequent parametric perfusion maps were calculated using RAPID software. CONTRAST:  154mL OMNIPAQUE IOHEXOL 350 MG/ML SOLN COMPARISON:  Prior CT from earlier the same day. FINDINGS: CTA NECK FINDINGS Aortic  arch: Visualized aortic arch normal in caliber with normal 3 vessel morphology. Mild atheromatous plaque along the undersurface of the arch and about the origin of the great vessels without hemodynamically significant stenosis. Visualized subclavian arteries widely patent. Right carotid system: Right CCA patent from its origin to the bifurcation without stenosis. Mild calcified plaque about the right bifurcation/proximal right ICA without hemodynamically significant stenosis. Right ICA widely patent distally to the skull  base without stenosis, dissection or occlusion. Left carotid system: Left CCA patent from its origin to the bifurcation without stenosis. Bulky calcified plaque about the left bifurcation/proximal left ICA with associated mild stenosis of up to approximately 35% by NASCET criteria. Left ICA widely patent distally to the skull base without stenosis, dissection, or occlusion. Vertebral arteries: Both vertebral arteries arise from the subclavian arteries. Right vertebral artery dominant. Vertebral arteries widely patent within the neck without stenosis, dissection, or occlusion. Skeleton: No acute osseous finding. No discrete lytic or blastic osseous lesions. Moderate cervical spondylolysis noted at C3-4 through C6-7. Other neck: No other acute soft tissue abnormality within the neck. Thyroid appears to be absent. Upper chest: Visualized upper chest demonstrates no acute finding. Scattered pleuroparenchymal thickening with linear scarring noted within the right greater than left upper lobes. Review of the MIP images confirms the above findings CTA HEAD FINDINGS Anterior circulation: Petrous segments widely patent bilaterally. Mild atheromatous plaque within the cavernous/supraclinoid ICAs without hemodynamically significant stenosis. A1 segments widely patent. Normal anterior communicating artery. Anterior cerebral arteries widely patent to their distal aspects without stenosis. No M1 stenosis or occlusion. Normal MCA bifurcations. Distal MCA branches well perfused and symmetric. Posterior circulation: Vertebral arteries widely patent to the vertebrobasilar junction without stenosis. Right vertebral artery dominant. Posteroinferior cerebral arteries patent bilaterally. 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 Basilar widely patent to its distal aspect without stenosis. Superior cerebellar and posterior cerebral arteries widely patent bilaterally. Venous sinuses: Patent. Anatomic variants: None  significant. Review of the MIP images confirms the above findings CT Brain Perfusion Findings: ASPECTS: 10 CBF (<30%) Volume: 30mL Perfusion (Tmax>6.0s) volume: 36mL Mismatch Volume: 23mL Infarction Location:Negative CT perfusion with no evidence for core infarct or perfusion abnormality. IMPRESSION: 1. Negative CTA of the head and neck with no evidence for large vessel occlusion. 2. Negative CT perfusion, with no evidence for acute core infarct or perfusion abnormality. 3. Mild atherosclerotic change about the left carotid bifurcation with associated mild 35% stenosis. 4. No other hemodynamically significant or correctable stenosis seen about the major arterial vasculature of the head and neck. A telephone call to the ordering clinician, Dr. Ruffin Frederick was made at 2:45 a.m. on 12/10/2018. A message was left with the clinician conveying these results. Electronically Signed   By: Jeannine Boga M.D.   On: 12/10/2018 02:51   Ct Cerebral Perfusion W Contrast  Result Date: 12/10/2018 CLINICAL DATA:  Initial evaluation for acute stroke. Slurred speech. EXAM: CT ANGIOGRAPHY HEAD AND NECK CT PERFUSION BRAIN TECHNIQUE: Multidetector CT imaging of the head and neck was performed using the standard protocol during bolus administration of intravenous contrast. Multiplanar CT image reconstructions and MIPs were obtained to evaluate the vascular anatomy. Carotid stenosis measurements (when applicable) are obtained utilizing NASCET criteria, using the distal internal carotid diameter as the denominator. Multiphase CT imaging of the brain was performed following IV bolus contrast injection. Subsequent parametric perfusion maps were calculated using RAPID software. CONTRAST:  156mL OMNIPAQUE IOHEXOL 350 MG/ML SOLN COMPARISON:  Prior CT from earlier the same day. FINDINGS:  CTA NECK FINDINGS Aortic arch: Visualized aortic arch normal in caliber with normal 3 vessel morphology. Mild atheromatous plaque along the undersurface  of the arch and about the origin of the great vessels without hemodynamically significant stenosis. Visualized subclavian arteries widely patent. Right carotid system: Right CCA patent from its origin to the bifurcation without stenosis. Mild calcified plaque about the right bifurcation/proximal right ICA without hemodynamically significant stenosis. Right ICA widely patent distally to the skull base without stenosis, dissection or occlusion. Left carotid system: Left CCA patent from its origin to the bifurcation without stenosis. Bulky calcified plaque about the left bifurcation/proximal left ICA with associated mild stenosis of up to approximately 35% by NASCET criteria. Left ICA widely patent distally to the skull base without stenosis, dissection, or occlusion. Vertebral arteries: Both vertebral arteries arise from the subclavian arteries. Right vertebral artery dominant. Vertebral arteries widely patent within the neck without stenosis, dissection, or occlusion. Skeleton: No acute osseous finding. No discrete lytic or blastic osseous lesions. Moderate cervical spondylolysis noted at C3-4 through C6-7. Other neck: No other acute soft tissue abnormality within the neck. Thyroid appears to be absent. Upper chest: Visualized upper chest demonstrates no acute finding. Scattered pleuroparenchymal thickening with linear scarring noted within the right greater than left upper lobes. Review of the MIP images confirms the above findings CTA HEAD FINDINGS Anterior circulation: Petrous segments widely patent bilaterally. Mild atheromatous plaque within the cavernous/supraclinoid ICAs without hemodynamically significant stenosis. A1 segments widely patent. Normal anterior communicating artery. Anterior cerebral arteries widely patent to their distal aspects without stenosis. No M1 stenosis or occlusion. Normal MCA bifurcations. Distal MCA branches well perfused and symmetric. Posterior circulation: Vertebral arteries widely  patent to the vertebrobasilar junction without stenosis. Right vertebral artery dominant. Posteroinferior cerebral arteries patent bilaterally. 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 Basilar widely patent to its distal aspect without stenosis. Superior cerebellar and posterior cerebral arteries widely patent bilaterally. Venous sinuses: Patent. Anatomic variants: None significant. Review of the MIP images confirms the above findings CT Brain Perfusion Findings: ASPECTS: 10 CBF (<30%) Volume: 50mL Perfusion (Tmax>6.0s) volume: 17mL Mismatch Volume: 27mL Infarction Location:Negative CT perfusion with no evidence for core infarct or perfusion abnormality. IMPRESSION: 1. Negative CTA of the head and neck with no evidence for large vessel occlusion. 2. Negative CT perfusion, with no evidence for acute core infarct or perfusion abnormality. 3. Mild atherosclerotic change about the left carotid bifurcation with associated mild 35% stenosis. 4. No other hemodynamically significant or correctable stenosis seen about the major arterial vasculature of the head and neck. A telephone call to the ordering clinician, Dr. Ruffin Frederick was made at 2:45 a.m. on 12/10/2018. A message was left with the clinician conveying these results. Electronically Signed   By: Jeannine Boga M.D.   On: 12/10/2018 02:51   Ct Head Code Stroke Wo Contrast  Result Date: 12/10/2018 CLINICAL DATA:  Code stroke. Initial evaluation for acute slurred speech. EXAM: CT HEAD WITHOUT CONTRAST TECHNIQUE: Contiguous axial images were obtained from the base of the skull through the vertex without intravenous contrast. COMPARISON:  None available. FINDINGS: Brain: Moderate age-related cerebral atrophy. Patchy and confluent hypodensity involving the periventricular deep white matter both cerebral hemispheres most consistent with chronic microvascular ischemic disease, moderately advanced. No acute intracranial hemorrhage. No acute  large vessel territory infarct. No mass lesion, midline shift or mass effect. No hydrocephalus. No extra-axial fluid collection. Vascular: No hyperdense vessel. Calcified atherosclerosis present at skull base. Skull: Scalp soft tissues demonstrate no  acute finding. Calvarium intact. Sinuses/Orbits: Globes and orbital soft tissues within normal limits. Visualized paranasal sinuses are clear. No mastoid effusion. Other: None. ASPECTS [facility] Stroke Program Early CT Score) - Ganglionic level infarction (caudate, lentiform nuclei, internal capsule, insula, M1-M3 cortex): 7 - Supraganglionic infarction (M4-M6 cortex): 3 Total score (0-10 with 10 being normal): 10 IMPRESSION: 1. No acute intracranial infarct or other abnormality identified. 2. ASPECTS is 10. 3. Moderately advanced cerebral atrophy with chronic microvascular ischemic disease. Critical Value/emergent results were called by telephone at the time of interpretation on 12/10/2018 at 12:33 am to Dr. Karma Greaser, who verbally acknowledged these results. Electronically Signed   By: Jeannine Boga M.D.   On: 12/10/2018 00:38      IMPRESSION AND PLAN:   1.  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  This is manifested by dysarthria and expressive dysphasia as well as right facial weakness and right pronator drift and fall likely secondary to right-sided hemiparesis.  She has residual right facial droop and right leg weakness with expressive dysphasia and dysarthria..  The patient will be admitted to an observation medically monitored bed.  We will follow neuro checks q.4 hours for 24 hours.  The patient will be placed on Plavix in addition to her  aspirin.  Will obtain bilateral carotid Doppler and 2D echo with bubble study.  Physical/occupation/speech therapy consults will be obtained in a.m..  The patient will be placed on statin therapy and fasting lipids will be checked.  2.  Hypertension.  Antihypertensives will be  continued with permissive parameters.  3.  Dyslipidemia.  Fasting lipids will be obtained and the patient will be placed on continued statin therapy.  4.  Hypothyroidism.  Synthroid will be continued and TSH level will be checked.  5.  DVT prophylaxis.  Subcutaneous Lovenox.  GI prophylaxis with continued PPI therapy.  All the records are reviewed and case discussed with ED provider. The plan of care was discussed in details with the patient (and family). I answered all questions. The patient agreed to proceed with the above mentioned plan. Further management will depend upon hospital course.   CODE STATUS: Full code  TOTAL TIME TAKING CARE OF THIS PATIENT: 50 minutes.    Christel Mormon M.D on 12/10/2018 at 3:28 AM  Pager - [phone]  After 6pm go to [email or web address] - Proofreader  [facility]  Office  [phone]  CC: Primary care physician; Dion Body, MD   Note: This dictation was prepared with Dragon dictation along with smaller phrase technology. Any transcriptional errors that result from this process are unintentional.

## 2018-12-11 DIAGNOSIS — R4781 Slurred speech: Secondary | ICD-10-CM

## 2018-12-11 LAB — CBC
HCT: 50.5 % — ABNORMAL HIGH (ref 36.0–46.0)
Hemoglobin: 13.8 g/dL (ref 12.0–15.0)
MCH: 32.4 pg (ref 26.0–34.0)
MCHC: 27.3 g/dL — ABNORMAL LOW (ref 30.0–36.0)
MCV: 99.3 fL (ref 80.0–100.0)
Platelets: 300 10*3/uL (ref 150–400)
RBC: 4.26 MIL/uL (ref 3.87–5.11)
RDW: 11.5 % (ref 11.5–15.5)
WBC: 9.5 10*3/uL (ref 4.0–10.5)
nRBC: 0 % (ref 0.0–0.2)

## 2018-12-11 NOTE — Plan of Care (Signed)
  Problem: Education: Goal: Knowledge of General Education information will improve Description: Including pain rating scale, medication(s)/side effects and non-pharmacologic comfort measures Outcome: Progressing   Problem: Health Behavior/Discharge Planning: Goal: Ability to manage health-related needs will improve Outcome: Progressing   Problem: Clinical Measurements: Goal: Ability to maintain clinical measurements within normal limits will improve Outcome: Not Progressing Note: LDL cholesterol is elevated at 102, which is relevant considering patient presented with a CVA this admission. PT is recommending CIR. Awaiting approval at this time. Wenda Low [facility]

## 2018-12-11 NOTE — Progress Notes (Signed)
Physical Therapy Treatment Patient Details Name: Meghan Dougherty MRN: [id number] DOB: June 04, 1940 Today's Date: 12/11/2018    History of Present Illness Pt is a 78 y.o. female presenting to hospital 12/10/18 with slurred speech starting about 4 pm 9/1 and then had a fall around 2130 when R knee gave out; pt also noted with R facial droop, R sided weakness, expressive aphasia, and dysarthria.  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  PMH includes R breast CA with h/o radiation treatments and partial mastectomy 01/04/2017, DM, htn, and h/o thyroid surgery.    PT Comments    Pt in bed, sleepy but awoke easily for session.  Son in and very attentive and supportive of pt during session.  To edge of bed with min a x 1.  Excellent effort and wanting to try on her own.  She did use rails and HOB raised.  Once seated she is able to sit today with supervision. Focused on sit to stand transfers x 10 from bed with proper hand placements as she often would forget to push with right or it would slide of of bed or handrail of bed.  Cues for proper grasp on walker as she would often have hand not fully positioned or handle between fingers and for thumb and index.  She had good carryover with education and repetition.  She was able to progress gait to 40' x 3 with min a x 1 with assist to steer walker as it would drift to the right due to uneven pushing with UE's.  While no formal buckling was noted of RLE weakness is evident with general unsteadiness and decreased step length and stance time.  Gait with no AD was trialed as she did not use a walker prior to admit.  She is unable to stand without BUE support and unable to attempt steps without HHA x 2.  She is able to walk 40' with B hand held assist and stated she is not comfortable at this time trying with less assist.  Continued with gait trials in hallway.  She has good awareness of deficits  and asks appropriate questions.  Son is supportive and she has a good support network at home.  CIR remains appropriate for discharge planning and pt and family are excited about the option.  She is able to tolerate extended therapy sessions and reported mild fatigue after session remaining in recliner.     Follow Up Recommendations  CIR     Equipment Recommendations  Rolling walker with 5" wheels;3in1 (PT)    Recommendations for Other Services      Precautions / Restrictions Precautions Precautions: Fall Precaution Comments: NPO Restrictions Weight Bearing Restrictions: No    Mobility  Bed Mobility Overal bed mobility: Needs Assistance Bed Mobility: Supine to Sit     Supine to sit: Min assist     General bed mobility comments: excellent effort and increased time but only required min a to get fully to edge of bed. Used rails and HOB raise.  Transfers Overall transfer level: Needs assistance Equipment used: Rolling walker (2 wheeled) Transfers: Sit to/from Stand Sit to Stand: Min assist         General transfer comment: focused on sit to stand transfers x 10 with empahsis on hand placement and safety.  Ambulation/Gait Ambulation/Gait assistance: Mod assist;Min assist   Assistive device: Rolling walker (2 wheeled);2 person hand held assist Gait Pattern/deviations: Step-through pattern;Decreased stance time -  right;Decreased step length - right;Drifts right/left Gait velocity: decreased   General Gait Details: gait with RW and min a x 1 with assist to help steer walker and for general balance and safety, Min a x 2 HHA without AD - did not use device prior to admit   Stairs             Wheelchair Mobility    Modified Rankin (Stroke Patients Only)       Balance Overall balance assessment: Needs assistance Sitting-balance support: Bilateral upper extremity supported;Feet supported Sitting balance-Leahy Scale: Fair Sitting balance - Comments:  supervision for safety   Standing balance support: Bilateral upper extremity supported Standing balance-Leahy Scale: Poor Standing balance comment: general assist for safety and balance.                            Cognition Arousal/Alertness: Awake/alert Behavior During Therapy: WFL for tasks assessed/performed Overall Cognitive Status: Within Functional Limits for tasks assessed                                        Exercises      General Comments        Pertinent Vitals/Pain Pain Assessment: Faces Faces Pain Scale: Hurts little more Pain Location: R arm and neck Pain Descriptors / Indicators: Sore Pain Intervention(s): Limited activity within patient's tolerance;Monitored during session;Repositioned    Home Living                      Prior Function            PT Goals (current goals can now be found in the care plan section) Acute Rehab PT Goals Patient Stated Goal: To regiain the use of her dominant RUE. Progress towards PT goals: Progressing toward goals    Frequency    7X/week      PT Plan Current plan remains appropriate    Co-evaluation              AM-PAC PT "6 Clicks" Mobility   Outcome Measure  Help needed turning from your back to your side while in a flat bed without using bedrails?: A Little Help needed moving from lying on your back to sitting on the side of a flat bed without using bedrails?: A Little Help needed moving to and from a bed to a chair (including a wheelchair)?: A Little Help needed standing up from a chair using your arms (e.g., wheelchair or bedside chair)?: A Little Help needed to walk in hospital room?: A Lot Help needed climbing 3-5 steps with a railing? : A Lot 6 Click Score: 16    End of Session Equipment Utilized During Treatment: Gait belt Activity Tolerance: Patient tolerated treatment well Patient left: in chair;with call bell/phone within reach;with chair alarm set;with  family/visitor present Nurse Communication: Mobility status Hemiplegia - Right/Left: Right Hemiplegia - dominant/non-dominant: Dominant Hemiplegia - caused by: Cerebral infarction     Time: [id number] PT Time Calculation (min) (ACUTE ONLY): 27 min  Charges:  $Gait Training: 8-22 mins $Therapeutic Activity: 8-22 mins                     Chesley Noon, PTA 12/11/18, 2:41 PM

## 2018-12-11 NOTE — Progress Notes (Signed)
Speech Language Pathology Treatment: Dysphagia  Patient Details Name: Meghan Dougherty MRN: [id number] DOB: 1940-05-20 Today's Date: 12/11/2018 Time: [id number] SLP Time Calculation (min) (ACUTE ONLY): 45 min  Assessment / Plan / Recommendation Clinical Impression  Pt continues to present w/ oropharyngeal phase dysphagia w/ increased risk for aspiration thus Pulmonary impact from such. Although, this morning, pt appears to present w/ slightly improved swallowing abilities per her(and Son's) report as well as slightly improved speech. Pt and Son stated she had been practicing her swallowing exs w/ the oral swabs as well as her speech exs as posted in room. Pt continues to present w/ Dysarthria and expressive language deficits(to be assessed). Pt reported less coughing on own saliva last night and this morning. She is NPO. Goals of this tx session included assessing readiness for po's and safety of swallow for oral intake/diet. Pt continues to exhibit Moderate oral motor weakness c/b decreased lingual strength/ROM and decreased labial tone/ROM; pt also has reduced-no Gag reflex w/ palpation via tongue blade. Pt was given Single trials of small-med. Ice Chips w/ instruction to masticate and swallow hard; also use a lingual sweep and f/u, Dry swallow to clear any water/saliva residue. Pt consumed Ice Chip trials w/ immediate, overt coughing x1/10 trials - she may have been distracted during the trial and lost focus of task. Cough was immediate and strong. Laryngeal excursion appeared adequate during the swallowing. Oral phase c/b min slower bolus manipulation during mastication, then A-P transfer. Oral clearing noted post swallows using the Dry swallow; no anterior spillage. Pt continues to report a "heavy" feeling on her R side orally. Discussed POC w/ pt/Son to include using Single Ice Chips as part of her swallowing exs for stimulation of swallowing and encouraged pt to focus on her swallowing;  effortful swallowing, intentional mastication and clearing, lingual sweeping, No talking. Encouraged her to continue to use the oral swabs as well. Due to degree of suspected pharyngeal phase dysphagia, recommend pt remain NPO w/ ongoing exercises to target swallowing and OM strength/ROM for another day. Instructed pt/family and NSG on oral hygiene prior to [employer]; aspiration precautions posted. Instructed pt on OMEs; posted exs. in room - encouraged increased OM movement and volume.  Pt continues to present w/ Expressive language deficits(word finding impacting fluency - will need to r/o any Receptive deficits as well), Dysarthria, and possible decreased R side awareness/proprioceptive awareness and follow through. ST services will continue to follow for ongoing assessment of both Dysphagia and Speech-Language abilities w/ objective assessment pending. MD/NSG and pt/family updated on above.      HPI HPI: Pt is a 78 y.o. female with Diabetes, prior R breast cancer w/ XRT, hypertension, hyperlipidemia who presents with slurred speech and R sided weakness.  Patient says she developed slurred speech around 4 PM on 12/09/2018 while she was talking to her friend on the phone.  She then had a fall when her right knee gave out.  She denies any pain currently.  She continued to present w/ slurred speech and difficulty with thinking of the words at the ED but stated it was improved from prior.  Also noted is continued R sided weakness.  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        SLP Plan  Continue with current plan of care;MBS       Recommendations  Diet recommendations: NPO(w/ ice chips following aspiration precautions) Medication Administration: Via alternative means Supervision: Patient  able to self feed;Staff to assist with self feeding;Full supervision/cueing for compensatory strategies Compensations: Minimize  environmental distractions;Slow rate;Small sips/bites;Lingual sweep for clearance of pocketing;Multiple dry swallows after each bite/sip Postural Changes and/or Swallow Maneuvers: Seated upright 90 degrees;Upright 30-60 min after meal                General recommendations: (Dietician f/u) Oral Care Recommendations: Oral care QID;Staff/trained caregiver to provide oral care;Patient independent with oral care(aspiration precautions during oral care; ice chips) Follow up Recommendations: Inpatient Rehab SLP Visit Diagnosis: Dysphagia, oropharyngeal phase (R13.12) Plan: Continue with current plan of care;MBS       GO                 Orinda Kenner, [place], CCC-SLP Watson,Katherine 12/11/2018, 2:56 PM

## 2018-12-11 NOTE — Progress Notes (Addendum)
Called to give nurse report, awaiting call back at this time (4:36P). [place] and gave report, all questions answered. Will tube any available medications and remove monitor. Will transport momentarily. Wenda Low [facility]

## 2018-12-11 NOTE — Consult Note (Signed)
Subjective: Pt feels his speech has improved.   Past Medical History:  Diagnosis Date  . Borderline diabetes   . Breast cancer, right ([place]) 11/2016   invasive mammary carcinoma, Lumpectomy and rad tx's.   . Colon polyp   . Diabetes mellitus without complication ([place])    type 2  . Hyperlipemia   . Hypertension   . Hyperthyroidism   . Hypothyroidism   . Insomnia   . Osteopenia   . Personal history of radiation therapy 2018   right breast cancer    Past Surgical History:  Procedure Laterality Date  . ABDOMINAL HYSTERECTOMY     oophorectomy  . APPENDECTOMY    . BREAST BIOPSY Left    neg core  . BREAST BIOPSY Right    neg  core  . BREAST BIOPSY Right 11/2016   invasive mammary carcinoma, [place]  . BREAST BIOPSY Right 11/2016   benign, stero  . BREAST LUMPECTOMY Right 12/2016   invasive mammary carcinoma  Negative margins  . CATARACT EXTRACTION W/ INTRAOCULAR LENS IMPLANT    . CHOLECYSTECTOMY    . COLONOSCOPY    . COLONOSCOPY WITH PROPOFOL N/A 01/15/2018   Procedure: COLONOSCOPY WITH PROPOFOL;  Surgeon: Toledo, Benay Pike, MD;  Location: [facility];  Service: Gastroenterology;  Laterality: N/A;  . EYE SURGERY     bilateral cataracts  . OOPHORECTOMY    . PARTIAL MASTECTOMY WITH NEEDLE LOCALIZATION Right 01/04/2017   Procedure: PARTIAL MASTECTOMY WITH NEEDLE LOCALIZATION;  Surgeon: Leonie Green, MD;  Location: [facility] ORS;  Service: General;  Laterality: Right;  . SENTINEL NODE BIOPSY Right 01/04/2017   Procedure: SENTINEL NODE BIOPSY;  Surgeon: Leonie Green, MD;  Location: [facility] ORS;  Service: General;  Laterality: Right;  . THYROID SURGERY     total thyroidectomy  . TONSILLECTOMY    . TOTAL THYROIDECTOMY      Family History  Problem Relation Age of Onset  . Breast cancer Sister 73  . Lung cancer Sister   . Diabetes Sister   . Breast cancer Maternal Aunt   . Diabetes Mother   . Diabetes Father   . Diabetes Brother   . Diabetes Sister   . Diabetes Sister    . Diabetes Brother     Social History:  reports that she has never smoked. She has never used smokeless tobacco. She reports current alcohol use. She reports that she does not use drugs.  Allergies  Allergen Reactions  . Levaquin [Levofloxacin] Rash  . Penicillins Rash    Has patient had a PCN reaction causing immediate rash, facial/tongue/throat swelling, SOB or lightheadedness with hypotension: No Has patient had a PCN reaction causing severe rash involving mucus membranes or skin necrosis: No Has patient had a PCN reaction that required hospitalization: No Has patient had a PCN reaction occurring within the last 10 years: No If all of the above answers are "NO", then may proceed with Cephalosporin use.   . Trazodone Other (See Comments)    Headache and nightmares    Medications: I have reviewed the patient's current medications.  ROS: History obtained from the patient  General ROS: negative for - chills, fatigue, fever, night sweats, weight gain or weight loss Psychological ROS: negative for - behavioral disorder, hallucinations, memory difficulties, mood swings or suicidal ideation Ophthalmic ROS: negative for - blurry vision, double vision, eye pain or loss of vision ENT ROS: negative for - epistaxis, nasal discharge, oral lesions, sore throat, tinnitus or vertigo Allergy and Immunology ROS: negative  for - hives or itchy/watery eyes Hematological and Lymphatic ROS: negative for - bleeding problems, bruising or swollen lymph nodes Endocrine ROS: negative for - galactorrhea, hair pattern changes, polydipsia/polyuria or temperature intolerance Respiratory ROS: negative for - cough, hemoptysis, shortness of breath or wheezing Cardiovascular ROS: negative for - chest pain, dyspnea on exertion, edema or irregular heartbeat Gastrointestinal ROS: negative for - abdominal pain, diarrhea, hematemesis, nausea/vomiting or stool incontinence Genito-Urinary ROS: negative for - dysuria,  hematuria, incontinence or urinary frequency/urgency Musculoskeletal ROS: negative for - joint swelling or muscular weakness Neurological ROS: as noted in HPI Dermatological ROS: negative for rash and skin lesion changes  Physical Examination: Blood pressure (!) 142/62, pulse 74, temperature 98.7 F (37.1 C), temperature source Oral, resp. rate 19, height 5' (1.524 m), weight 74.5 kg, SpO2 96 %.   Neurological Examination   Mental Status: Alert, oriented, thought content appropriate.  Dysarthria with occasional word finding difficulty.  Cranial Nerves: II: Discs flat bilaterally; Visual fields grossly normal, pupils equal, round, reactive to light and accommodation III,IV, VI: ptosis not present, extra-ocular motions intact bilaterally V,VII: R facial droop  VIII: hearing normal bilaterally IX,X: gag reflex present XI: bilateral shoulder shrug XII: midline tongue extension Motor: Right : Upper extremity   4/5    Left:     Upper extremity   5/5  Lower extremity   4+/5     Lower extremity   5/5 Tone and bulk:normal tone throughout; no atrophy noted Sensory: Pinprick and light touch intact throughout, bilaterally Deep Tendon Reflexes: 1+ and symmetric throughout Plantars: Right: downgoing   Left: downgoing Cerebellar: normal finger-to-nose Gait: not tested      Laboratory Studies:   Basic Metabolic Panel: Recent Labs  Lab 12/10/18 0034  NA 132*  K 3.7  CL 96*  CO2 23  GLUCOSE 133*  BUN 15  CREATININE 0.89  CALCIUM 9.3    Liver Function Tests: Recent Labs  Lab 12/10/18 0034  AST 31  ALT 21  ALKPHOS 76  BILITOT 0.6  PROT 7.4  ALBUMIN 4.3   No results for input(s): LIPASE, AMYLASE in the last 168 hours. No results for input(s): AMMONIA in the last 168 hours.  CBC: Recent Labs  Lab 12/10/18 0034  WBC 9.5  NEUTROABS 6.6  HGB 13.8  HCT 50.5*  MCV 118.5*  PLT 300    Cardiac Enzymes: No results for input(s): CKTOTAL, CKMB, CKMBINDEX, TROPONINI in the  last 168 hours.  BNP: Invalid input(s): POCBNP  CBG: No results for input(s): GLUCAP in the last 168 hours.  Microbiology: Results for orders placed or performed during the hospital encounter of 12/10/18  SARS Coronavirus 2 [facility] order, Performed in [facility] hospital lab) Nasopharyngeal Nasopharyngeal Swab     Status: None   Collection Time: 12/10/18 12:34 AM   Specimen: Nasopharyngeal Swab  Result Value Ref Range Status   SARS Coronavirus 2 NEGATIVE NEGATIVE Final    Comment: (NOTE) If result is NEGATIVE SARS-CoV-2 target nucleic acids are NOT DETECTED. The SARS-CoV-2 RNA is generally detectable in upper and lower  respiratory specimens during the acute phase of infection. The lowest  concentration of SARS-CoV-2 viral copies this assay can detect is 250  copies / mL. A negative result does not preclude SARS-CoV-2 infection  and should not be used as the sole basis for treatment or other  patient management decisions.  A negative result may occur with  improper specimen collection / handling, submission of specimen other  than nasopharyngeal swab, presence of  viral mutation(s) within the  areas targeted by this assay, and inadequate number of viral copies  (<250 copies / mL). A negative result must be combined with clinical  observations, patient history, and epidemiological information. If result is POSITIVE SARS-CoV-2 target nucleic acids are DETECTED. The SARS-CoV-2 RNA is generally detectable in upper and lower  respiratory specimens dur ing the acute phase of infection.  Positive  results are indicative of active infection with SARS-CoV-2.  Clinical  correlation with patient history and other diagnostic information is  necessary to determine patient infection status.  Positive results do  not rule out bacterial infection or co-infection with other viruses. If result is PRESUMPTIVE POSTIVE SARS-CoV-2 nucleic acids MAY BE PRESENT.   A presumptive positive result was  obtained on the submitted specimen  and confirmed on repeat testing.  While 2019 novel coronavirus  (SARS-CoV-2) nucleic acids may be present in the submitted sample  additional confirmatory testing may be necessary for epidemiological  and / or clinical management purposes  to differentiate between  SARS-CoV-2 and other Sarbecovirus currently known to infect humans.  If clinically indicated additional testing with an alternate test  methodology [phone]) is advised. The SARS-CoV-2 RNA is generally  detectable in upper and lower respiratory sp ecimens during the acute  phase of infection. The expected result is Negative. Fact Sheet for Patients:  StrictlyIdeas.no Fact Sheet for Healthcare Providers: BankingDealers.co.za This test is not yet approved or cleared by the [place] FDA and has been authorized for detection and/or diagnosis of SARS-CoV-2 by FDA under an Emergency Use Authorization (EUA).  This EUA will remain in effect (meaning this test can be used) for the duration of the COVID-19 declaration under Section 564(b)(1) of the Act, 21 U.S.C. section 360bbb-3(b)(1), unless the authorization is terminated or revoked sooner. Performed at [facility], [address] [place], [place] [address]     Coagulation Studies: Recent Labs    12/10/18 0034  LABPROT 12.6  INR 1.0    Urinalysis: No results for input(s): COLORURINE, LABSPEC, PHURINE, GLUCOSEU, HGBUR, BILIRUBINUR, KETONESUR, PROTEINUR, UROBILINOGEN, NITRITE, LEUKOCYTESUR in the last 168 hours.  Invalid input(s): APPERANCEUR  Lipid Panel:     Component Value Date/Time   CHOL 193 12/10/2018 0505   TRIG 131 12/10/2018 0505   HDL 65 12/10/2018 0505   CHOLHDL 3.0 12/10/2018 0505   VLDL 26 12/10/2018 0505   LDLCALC 102 (H) 12/10/2018 0505    HgbA1C:  Lab Results  Component Value Date   HGBA1C 6.1 (H) 12/10/2018    Urine Drug Screen:  No results  found for: LABOPIA, COCAINSCRNUR, LABBENZ, AMPHETMU, THCU, LABBARB  Alcohol Level: No results for input(s): ETH in the last 168 hours.  Other results: EKG: normal EKG, normal sinus rhythm, unchanged from previous tracings.  Imaging: Ct Angio Head W Or Wo Contrast  Result Date: 12/10/2018 CLINICAL DATA:  Initial evaluation for acute stroke. Slurred speech. EXAM: CT ANGIOGRAPHY HEAD AND NECK CT PERFUSION BRAIN TECHNIQUE: Multidetector CT imaging of the head and neck was performed using the standard protocol during bolus administration of intravenous contrast. Multiplanar CT image reconstructions and MIPs were obtained to evaluate the vascular anatomy. Carotid stenosis measurements (when applicable) are obtained utilizing NASCET criteria, using the distal internal carotid diameter as the denominator. Multiphase CT imaging of the brain was performed following IV bolus contrast injection. Subsequent parametric perfusion maps were calculated using RAPID software. CONTRAST:  126mL OMNIPAQUE IOHEXOL 350 MG/ML SOLN COMPARISON:  Prior CT from earlier the same day.  FINDINGS: CTA NECK FINDINGS Aortic arch: Visualized aortic arch normal in caliber with normal 3 vessel morphology. Mild atheromatous plaque along the undersurface of the arch and about the origin of the great vessels without hemodynamically significant stenosis. Visualized subclavian arteries widely patent. Right carotid system: Right CCA patent from its origin to the bifurcation without stenosis. Mild calcified plaque about the right bifurcation/proximal right ICA without hemodynamically significant stenosis. Right ICA widely patent distally to the skull base without stenosis, dissection or occlusion. Left carotid system: Left CCA patent from its origin to the bifurcation without stenosis. Bulky calcified plaque about the left bifurcation/proximal left ICA with associated mild stenosis of up to approximately 35% by NASCET criteria. Left ICA widely patent  distally to the skull base without stenosis, dissection, or occlusion. Vertebral arteries: Both vertebral arteries arise from the subclavian arteries. Right vertebral artery dominant. Vertebral arteries widely patent within the neck without stenosis, dissection, or occlusion. Skeleton: No acute osseous finding. No discrete lytic or blastic osseous lesions. Moderate cervical spondylolysis noted at C3-4 through C6-7. Other neck: No other acute soft tissue abnormality within the neck. Thyroid appears to be absent. Upper chest: Visualized upper chest demonstrates no acute finding. Scattered pleuroparenchymal thickening with linear scarring noted within the right greater than left upper lobes. Review of the MIP images confirms the above findings CTA HEAD FINDINGS Anterior circulation: Petrous segments widely patent bilaterally. Mild atheromatous plaque within the cavernous/supraclinoid ICAs without hemodynamically significant stenosis. A1 segments widely patent. Normal anterior communicating artery. Anterior cerebral arteries widely patent to their distal aspects without stenosis. No M1 stenosis or occlusion. Normal MCA bifurcations. Distal MCA branches well perfused and symmetric. Posterior circulation: Vertebral arteries widely patent to the vertebrobasilar junction without stenosis. Right vertebral artery dominant. Posteroinferior cerebral arteries patent bilaterally. 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 Basilar widely patent to its distal aspect without stenosis. Superior cerebellar and posterior cerebral arteries widely patent bilaterally. Venous sinuses: Patent. Anatomic variants: None significant. Review of the MIP images confirms the above findings CT Brain Perfusion Findings: ASPECTS: 10 CBF (<30%) Volume: 52mL Perfusion (Tmax>6.0s) volume: 85mL Mismatch Volume: 30mL Infarction Location:Negative CT perfusion with no evidence for core infarct or perfusion abnormality. IMPRESSION: 1.  Negative CTA of the head and neck with no evidence for large vessel occlusion. 2. Negative CT perfusion, with no evidence for acute core infarct or perfusion abnormality. 3. Mild atherosclerotic change about the left carotid bifurcation with associated mild 35% stenosis. 4. No other hemodynamically significant or correctable stenosis seen about the major arterial vasculature of the head and neck. A telephone call to the ordering clinician, Dr. Ruffin Frederick was made at 2:45 a.m. on 12/10/2018. A message was left with the clinician conveying these results. Electronically Signed   By: Jeannine Boga M.D.   On: 12/10/2018 02:51   Dg Chest 2 View  Result Date: 12/10/2018 CLINICAL DATA:  TIA. EXAM: CHEST - 2 VIEW COMPARISON:  None. FINDINGS: Heart size is normal. Chronic interstitial coarsening is noted. There is no edema or effusion. No focal airspace disease is present. Chronic scarring is noted at the left base. Degenerative changes are noted in the thoracic spine. IMPRESSION: 1. No acute cardiopulmonary disease. 2. Chronic changes of COPD. Electronically Signed   By: San Morelle M.D.   On: 12/10/2018 04:09   Ct Angio Neck W Or Wo Contrast  Result Date: 12/10/2018 CLINICAL DATA:  Initial evaluation for acute stroke. Slurred speech. EXAM: CT ANGIOGRAPHY HEAD AND NECK CT PERFUSION BRAIN TECHNIQUE:  Multidetector CT imaging of the head and neck was performed using the standard protocol during bolus administration of intravenous contrast. Multiplanar CT image reconstructions and MIPs were obtained to evaluate the vascular anatomy. Carotid stenosis measurements (when applicable) are obtained utilizing NASCET criteria, using the distal internal carotid diameter as the denominator. Multiphase CT imaging of the brain was performed following IV bolus contrast injection. Subsequent parametric perfusion maps were calculated using RAPID software. CONTRAST:  119mL OMNIPAQUE IOHEXOL 350 MG/ML SOLN COMPARISON:   Prior CT from earlier the same day. FINDINGS: CTA NECK FINDINGS Aortic arch: Visualized aortic arch normal in caliber with normal 3 vessel morphology. Mild atheromatous plaque along the undersurface of the arch and about the origin of the great vessels without hemodynamically significant stenosis. Visualized subclavian arteries widely patent. Right carotid system: Right CCA patent from its origin to the bifurcation without stenosis. Mild calcified plaque about the right bifurcation/proximal right ICA without hemodynamically significant stenosis. Right ICA widely patent distally to the skull base without stenosis, dissection or occlusion. Left carotid system: Left CCA patent from its origin to the bifurcation without stenosis. Bulky calcified plaque about the left bifurcation/proximal left ICA with associated mild stenosis of up to approximately 35% by NASCET criteria. Left ICA widely patent distally to the skull base without stenosis, dissection, or occlusion. Vertebral arteries: Both vertebral arteries arise from the subclavian arteries. Right vertebral artery dominant. Vertebral arteries widely patent within the neck without stenosis, dissection, or occlusion. Skeleton: No acute osseous finding. No discrete lytic or blastic osseous lesions. Moderate cervical spondylolysis noted at C3-4 through C6-7. Other neck: No other acute soft tissue abnormality within the neck. Thyroid appears to be absent. Upper chest: Visualized upper chest demonstrates no acute finding. Scattered pleuroparenchymal thickening with linear scarring noted within the right greater than left upper lobes. Review of the MIP images confirms the above findings CTA HEAD FINDINGS Anterior circulation: Petrous segments widely patent bilaterally. Mild atheromatous plaque within the cavernous/supraclinoid ICAs without hemodynamically significant stenosis. A1 segments widely patent. Normal anterior communicating artery. Anterior cerebral arteries widely  patent to their distal aspects without stenosis. No M1 stenosis or occlusion. Normal MCA bifurcations. Distal MCA branches well perfused and symmetric. Posterior circulation: Vertebral arteries widely patent to the vertebrobasilar junction without stenosis. Right vertebral artery dominant. Posteroinferior cerebral arteries patent bilaterally. 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 Basilar widely patent to its distal aspect without stenosis. Superior cerebellar and posterior cerebral arteries widely patent bilaterally. Venous sinuses: Patent. Anatomic variants: None significant. Review of the MIP images confirms the above findings CT Brain Perfusion Findings: ASPECTS: 10 CBF (<30%) Volume: 28mL Perfusion (Tmax>6.0s) volume: 41mL Mismatch Volume: 66mL Infarction Location:Negative CT perfusion with no evidence for core infarct or perfusion abnormality. IMPRESSION: 1. Negative CTA of the head and neck with no evidence for large vessel occlusion. 2. Negative CT perfusion, with no evidence for acute core infarct or perfusion abnormality. 3. Mild atherosclerotic change about the left carotid bifurcation with associated mild 35% stenosis. 4. No other hemodynamically significant or correctable stenosis seen about the major arterial vasculature of the head and neck. A telephone call to the ordering clinician, Dr. Ruffin Frederick was made at 2:45 a.m. on 12/10/2018. A message was left with the clinician conveying these results. Electronically Signed   By: Jeannine Boga M.D.   On: 12/10/2018 02:51   Mr Brain Wo Contrast  Result Date: 12/10/2018 CLINICAL DATA:  TIA, initial exam. Acute onset of slurred speech and difficulty with word finding beginning  at 9 o'clock p.m. last night. EXAM: MRI HEAD WITHOUT CONTRAST TECHNIQUE: Multiplanar, multiecho pulse sequences of the brain and surrounding structures were obtained without intravenous contrast. COMPARISON:  CTA head and neck and CT perfusion  12/10/2018 FINDINGS: Brain: The diffusion-weighted images demonstrate restricted diffusion consistent with an acute nonhemorrhagic infarct in the posterior limb of the left internal capsule. The area of infarction there is linear measuring 11 mm. Moderate confluent periventricular and scattered subcortical T2 hyperintensities are present bilaterally. Dilated perivascular spaces are present throughout the basal ganglia. The ventricles are proportionate to the degree of atrophy. Corpus callosum is normal. No significant extraaxial fluid collection is present. The internal auditory canals are within normal limits. The brainstem and cerebellum are within normal limits. Vascular: Flow is present in the major intracranial arteries. Skull and upper cervical spine: The craniocervical junction is normal. Upper cervical spine is within normal limits. Marrow signal is unremarkable. Sinuses/Orbits: The paranasal sinuses and mastoid air cells are clear. Bilateral lens replacements are noted. Globes and orbits are otherwise unremarkable. IMPRESSION: 1. 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 2. Moderate diffuse confluent periventricular and scattered subcortical T2 hyperintensities bilaterally consistent with chronic microvascular ischemic change. Electronically Signed   By: San Morelle M.D.   On: 12/10/2018 05:13   Ct Cerebral Perfusion W Contrast  Result Date: 12/10/2018 CLINICAL DATA:  Initial evaluation for acute stroke. Slurred speech. EXAM: CT ANGIOGRAPHY HEAD AND NECK CT PERFUSION BRAIN TECHNIQUE: Multidetector CT imaging of the head and neck was performed using the standard protocol during bolus administration of intravenous contrast. Multiplanar CT image reconstructions and MIPs were obtained to evaluate the vascular anatomy. Carotid stenosis measurements (when applicable) are obtained utilizing NASCET criteria, using the distal internal  carotid diameter as the denominator. Multiphase CT imaging of the brain was performed following IV bolus contrast injection. Subsequent parametric perfusion maps were calculated using RAPID software. CONTRAST:  127mL OMNIPAQUE IOHEXOL 350 MG/ML SOLN COMPARISON:  Prior CT from earlier the same day. FINDINGS: CTA NECK FINDINGS Aortic arch: Visualized aortic arch normal in caliber with normal 3 vessel morphology. Mild atheromatous plaque along the undersurface of the arch and about the origin of the great vessels without hemodynamically significant stenosis. Visualized subclavian arteries widely patent. Right carotid system: Right CCA patent from its origin to the bifurcation without stenosis. Mild calcified plaque about the right bifurcation/proximal right ICA without hemodynamically significant stenosis. Right ICA widely patent distally to the skull base without stenosis, dissection or occlusion. Left carotid system: Left CCA patent from its origin to the bifurcation without stenosis. Bulky calcified plaque about the left bifurcation/proximal left ICA with associated mild stenosis of up to approximately 35% by NASCET criteria. Left ICA widely patent distally to the skull base without stenosis, dissection, or occlusion. Vertebral arteries: Both vertebral arteries arise from the subclavian arteries. Right vertebral artery dominant. Vertebral arteries widely patent within the neck without stenosis, dissection, or occlusion. Skeleton: No acute osseous finding. No discrete lytic or blastic osseous lesions. Moderate cervical spondylolysis noted at C3-4 through C6-7. Other neck: No other acute soft tissue abnormality within the neck. Thyroid appears to be absent. Upper chest: Visualized upper chest demonstrates no acute finding. Scattered pleuroparenchymal thickening with linear scarring noted within the right greater than left upper lobes. Review of the MIP images confirms the above findings CTA HEAD FINDINGS Anterior  circulation: Petrous segments widely patent bilaterally. Mild atheromatous plaque within the cavernous/supraclinoid ICAs without hemodynamically significant stenosis. A1  segments widely patent. Normal anterior communicating artery. Anterior cerebral arteries widely patent to their distal aspects without stenosis. No M1 stenosis or occlusion. Normal MCA bifurcations. Distal MCA branches well perfused and symmetric. Posterior circulation: Vertebral arteries widely patent to the vertebrobasilar junction without stenosis. Right vertebral artery dominant. Posteroinferior cerebral arteries patent bilaterally. 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 Basilar widely patent to its distal aspect without stenosis. Superior cerebellar and posterior cerebral arteries widely patent bilaterally. Venous sinuses: Patent. Anatomic variants: None significant. Review of the MIP images confirms the above findings CT Brain Perfusion Findings: ASPECTS: 10 CBF (<30%) Volume: 51mL Perfusion (Tmax>6.0s) volume: 73mL Mismatch Volume: 34mL Infarction Location:Negative CT perfusion with no evidence for core infarct or perfusion abnormality. IMPRESSION: 1. Negative CTA of the head and neck with no evidence for large vessel occlusion. 2. Negative CT perfusion, with no evidence for acute core infarct or perfusion abnormality. 3. Mild atherosclerotic change about the left carotid bifurcation with associated mild 35% stenosis. 4. No other hemodynamically significant or correctable stenosis seen about the major arterial vasculature of the head and neck. A telephone call to the ordering clinician, Dr. Ruffin Frederick was made at 2:45 a.m. on 12/10/2018. A message was left with the clinician conveying these results. Electronically Signed   By: Jeannine Boga M.D.   On: 12/10/2018 02:51   Ct Head Code Stroke Wo Contrast  Result Date: 12/10/2018 CLINICAL DATA:  Code stroke. Initial evaluation for acute slurred speech. EXAM: CT  HEAD WITHOUT CONTRAST TECHNIQUE: Contiguous axial images were obtained from the base of the skull through the vertex without intravenous contrast. COMPARISON:  None available. FINDINGS: Brain: Moderate age-related cerebral atrophy. Patchy and confluent hypodensity involving the periventricular deep white matter both cerebral hemispheres most consistent with chronic microvascular ischemic disease, moderately advanced. No acute intracranial hemorrhage. No acute large vessel territory infarct. No mass lesion, midline shift or mass effect. No hydrocephalus. No extra-axial fluid collection. Vascular: No hyperdense vessel. Calcified atherosclerosis present at skull base. Skull: Scalp soft tissues demonstrate no acute finding. Calvarium intact. Sinuses/Orbits: Globes and orbital soft tissues within normal limits. Visualized paranasal sinuses are clear. No mastoid effusion. Other: None. ASPECTS [facility] Stroke Program Early CT Score) - Ganglionic level infarction (caudate, lentiform nuclei, internal capsule, insula, M1-M3 cortex): 7 - Supraganglionic infarction (M4-M6 cortex): 3 Total score (0-10 with 10 being normal): 10 IMPRESSION: 1. No acute intracranial infarct or other abnormality identified. 2. ASPECTS is 10. 3. Moderately advanced cerebral atrophy with chronic microvascular ischemic disease. Critical Value/emergent results were called by telephone at the time of interpretation on 12/10/2018 at 12:33 am to Dr. Karma Greaser, who verbally acknowledged these results. Electronically Signed   By: Jeannine Boga M.D.   On: 12/10/2018 00:38     Assessment/Plan:  78 y.o. female  with a known history of type 2 diabetes mellitus, breast cancer, hypertension, dyslipidemia and hypothyroidism, presented to the emergency room with onset of slurred speech that started at 4 PM on 12/09/2018, with right facial droop and mild weakness on the right side.  Pt is on ASA 81mg  daily. 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 She also has chronic small vessel changes consistent with chronic small vessel disease - CTA head and neck no vessel stenosis and patent carotids - Was on ASA 81 mg at home, ASA 325  when able to swallow - Appreciate speech evaluation today for swallowing as feels speech is better  12/11/2018, 12:04 PM

## 2018-12-11 NOTE — Progress Notes (Signed)
[facility] [place] NAME: Meghan Dougherty    MR#:  [id number]  DATE OF BIRTH:  1941/01/31  SUBJECTIVE: Patient is admitted for acute left internal capsular stroke, patient still has difficulty talking no new weakness in hands or legs  CHIEF COMPLAINT:   Chief Complaint  Patient presents with  . Code Stroke    REVIEW OF SYSTEMS:   ROS CONSTITUTIONAL: No fever, fatigue or weakness.  EYES: No blurred or double vision.  EARS, NOSE, AND THROAT: No tinnitus or ear pain.  RESPIRATORY: No cough, shortness of breath, wheezing or hemoptysis.  CARDIOVASCULAR: No chest pain, orthopnea, edema.  GASTROINTESTINAL: No nausea, vomiting, diarrhea or abdominal pain.  GENITOURINARY: No dysuria, hematuria.  ENDOCRINE: No polyuria, nocturia,  HEMATOLOGY: No anemia, easy bruising or bleeding SKIN: No rash or lesion. MUSCULOSKELETAL: No joint pain or arthritis.   NEUROLOGIC: No tingling, numbness, weakness.  Patient has difficulty getting the words out and slurred speech noted. PSYCHIATRY: No anxiety or depression.   DRUG ALLERGIES:   Allergies  Allergen Reactions  . Levaquin [Levofloxacin] Rash  . Penicillins Rash    Has patient had a PCN reaction causing immediate rash, facial/tongue/throat swelling, SOB or lightheadedness with hypotension: No Has patient had a PCN reaction causing severe rash involving mucus membranes or skin necrosis: No Has patient had a PCN reaction that required hospitalization: No Has patient had a PCN reaction occurring within the last 10 years: No If all of the above answers are "NO", then may proceed with Cephalosporin use.   . Trazodone Other (See Comments)    Headache and nightmares    VITALS:  Blood pressure (!) 142/62, pulse 74, temperature 98.7 F (37.1 C), temperature source Oral, resp. rate 19, height 5' (1.524 m), weight 74.5 kg, SpO2 96 %.  PHYSICAL EXAMINATION:  GENERAL:  78 y.o.-year-old patient  lying in the bed with no acute distress.  Has expressive dysarthria. EYES: Pupils equal, round, reactive to light . No scleral icterus. Extraocular muscles intact.  HEENT: Head atraumatic, normocephalic. Oropharynx and nasopharynx clear.  NECK:  Supple, no jugular venous distention. No thyroid enlargement, no tenderness.  LUNGS: Patient noted to have some cough.  Breath sounds are clear.    CARDIOVASCULAR: S1, S2 normal. No murmurs, rubs, or gallops.  ABDOMEN: Soft, nontender, nondistended. Bowel sounds present. No organomegaly or mass.  EXTREMITIES: No pedal edema, cyanosis, or clubbing.  NEUROLOGIC: Patient is alert, oriented, Dysarthric, cranial nerves II through XII are intact except for right facial droop, diminished strength in right upper and lower extremity 4 out of 5, left upper and lower 5 out of 5 in strength.  Tone is normal.  Sensations are intact, DTR 1+ symmetric throughout.   CPSYCHIATRIC: The patient is alert and oriented x 3.  SKIN: No obvious rash, lesion, or ulcer.    LABORATORY PANEL:   CBC Recent Labs  Lab 12/10/18 0034  WBC 9.5  HGB 13.8  HCT 50.5*  PLT 300   ------------------------------------------------------------------------------------------------------------------  Chemistries  Recent Labs  Lab 12/10/18 0034  NA 132*  K 3.7  CL 96*  CO2 23  GLUCOSE 133*  BUN 15  CREATININE 0.89  CALCIUM 9.3  AST 31  ALT 21  ALKPHOS 76  BILITOT 0.6   ------------------------------------------------------------------------------------------------------------------  Cardiac Enzymes No results for input(s): TROPONINI in the last 168 hours. ------------------------------------------------------------------------------------------------------------------  RADIOLOGY:  Ct Angio Head W Or Wo Contrast  Result Date: 12/10/2018 CLINICAL DATA:  Initial evaluation for acute  stroke. Slurred speech. EXAM: CT ANGIOGRAPHY HEAD AND NECK CT PERFUSION BRAIN TECHNIQUE:  Multidetector CT imaging of the head and neck was performed using the standard protocol during bolus administration of intravenous contrast. Multiplanar CT image reconstructions and MIPs were obtained to evaluate the vascular anatomy. Carotid stenosis measurements (when applicable) are obtained utilizing NASCET criteria, using the distal internal carotid diameter as the denominator. Multiphase CT imaging of the brain was performed following IV bolus contrast injection. Subsequent parametric perfusion maps were calculated using RAPID software. CONTRAST:  129mL OMNIPAQUE IOHEXOL 350 MG/ML SOLN COMPARISON:  Prior CT from earlier the same day. FINDINGS: CTA NECK FINDINGS Aortic arch: Visualized aortic arch normal in caliber with normal 3 vessel morphology. Mild atheromatous plaque along the undersurface of the arch and about the origin of the great vessels without hemodynamically significant stenosis. Visualized subclavian arteries widely patent. Right carotid system: Right CCA patent from its origin to the bifurcation without stenosis. Mild calcified plaque about the right bifurcation/proximal right ICA without hemodynamically significant stenosis. Right ICA widely patent distally to the skull base without stenosis, dissection or occlusion. Left carotid system: Left CCA patent from its origin to the bifurcation without stenosis. Bulky calcified plaque about the left bifurcation/proximal left ICA with associated mild stenosis of up to approximately 35% by NASCET criteria. Left ICA widely patent distally to the skull base without stenosis, dissection, or occlusion. Vertebral arteries: Both vertebral arteries arise from the subclavian arteries. Right vertebral artery dominant. Vertebral arteries widely patent within the neck without stenosis, dissection, or occlusion. Skeleton: No acute osseous finding. No discrete lytic or blastic osseous lesions. Moderate cervical spondylolysis noted at C3-4 through C6-7. Other neck: No  other acute soft tissue abnormality within the neck. Thyroid appears to be absent. Upper chest: Visualized upper chest demonstrates no acute finding. Scattered pleuroparenchymal thickening with linear scarring noted within the right greater than left upper lobes. Review of the MIP images confirms the above findings CTA HEAD FINDINGS Anterior circulation: Petrous segments widely patent bilaterally. Mild atheromatous plaque within the cavernous/supraclinoid ICAs without hemodynamically significant stenosis. A1 segments widely patent. Normal anterior communicating artery. Anterior cerebral arteries widely patent to their distal aspects without stenosis. No M1 stenosis or occlusion. Normal MCA bifurcations. Distal MCA branches well perfused and symmetric. Posterior circulation: Vertebral arteries widely patent to the vertebrobasilar junction without stenosis. Right vertebral artery dominant. Posteroinferior cerebral arteries patent bilaterally. 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 Basilar widely patent to its distal aspect without stenosis. Superior cerebellar and posterior cerebral arteries widely patent bilaterally. Venous sinuses: Patent. Anatomic variants: None significant. Review of the MIP images confirms the above findings CT Brain Perfusion Findings: ASPECTS: 10 CBF (<30%) Volume: 16mL Perfusion (Tmax>6.0s) volume: 43mL Mismatch Volume: 47mL Infarction Location:Negative CT perfusion with no evidence for core infarct or perfusion abnormality. IMPRESSION: 1. Negative CTA of the head and neck with no evidence for large vessel occlusion. 2. Negative CT perfusion, with no evidence for acute core infarct or perfusion abnormality. 3. Mild atherosclerotic change about the left carotid bifurcation with associated mild 35% stenosis. 4. No other hemodynamically significant or correctable stenosis seen about the major arterial vasculature of the head and neck. A telephone call to the ordering  clinician, Dr. Ruffin Frederick was made at 2:45 a.m. on 12/10/2018. A message was left with the clinician conveying these results. Electronically Signed   By: Jeannine Boga M.D.   On: 12/10/2018 02:51   Dg Chest 2 View  Result Date: 12/10/2018 CLINICAL DATA:  TIA. EXAM: CHEST - 2 VIEW COMPARISON:  None. FINDINGS: Heart size is normal. Chronic interstitial coarsening is noted. There is no edema or effusion. No focal airspace disease is present. Chronic scarring is noted at the left base. Degenerative changes are noted in the thoracic spine. IMPRESSION: 1. No acute cardiopulmonary disease. 2. Chronic changes of COPD. Electronically Signed   By: San Morelle M.D.   On: 12/10/2018 04:09   Ct Angio Neck W Or Wo Contrast  Result Date: 12/10/2018 CLINICAL DATA:  Initial evaluation for acute stroke. Slurred speech. EXAM: CT ANGIOGRAPHY HEAD AND NECK CT PERFUSION BRAIN TECHNIQUE: Multidetector CT imaging of the head and neck was performed using the standard protocol during bolus administration of intravenous contrast. Multiplanar CT image reconstructions and MIPs were obtained to evaluate the vascular anatomy. Carotid stenosis measurements (when applicable) are obtained utilizing NASCET criteria, using the distal internal carotid diameter as the denominator. Multiphase CT imaging of the brain was performed following IV bolus contrast injection. Subsequent parametric perfusion maps were calculated using RAPID software. CONTRAST:  115mL OMNIPAQUE IOHEXOL 350 MG/ML SOLN COMPARISON:  Prior CT from earlier the same day. FINDINGS: CTA NECK FINDINGS Aortic arch: Visualized aortic arch normal in caliber with normal 3 vessel morphology. Mild atheromatous plaque along the undersurface of the arch and about the origin of the great vessels without hemodynamically significant stenosis. Visualized subclavian arteries widely patent. Right carotid system: Right CCA patent from its origin to the bifurcation without  stenosis. Mild calcified plaque about the right bifurcation/proximal right ICA without hemodynamically significant stenosis. Right ICA widely patent distally to the skull base without stenosis, dissection or occlusion. Left carotid system: Left CCA patent from its origin to the bifurcation without stenosis. Bulky calcified plaque about the left bifurcation/proximal left ICA with associated mild stenosis of up to approximately 35% by NASCET criteria. Left ICA widely patent distally to the skull base without stenosis, dissection, or occlusion. Vertebral arteries: Both vertebral arteries arise from the subclavian arteries. Right vertebral artery dominant. Vertebral arteries widely patent within the neck without stenosis, dissection, or occlusion. Skeleton: No acute osseous finding. No discrete lytic or blastic osseous lesions. Moderate cervical spondylolysis noted at C3-4 through C6-7. Other neck: No other acute soft tissue abnormality within the neck. Thyroid appears to be absent. Upper chest: Visualized upper chest demonstrates no acute finding. Scattered pleuroparenchymal thickening with linear scarring noted within the right greater than left upper lobes. Review of the MIP images confirms the above findings CTA HEAD FINDINGS Anterior circulation: Petrous segments widely patent bilaterally. Mild atheromatous plaque within the cavernous/supraclinoid ICAs without hemodynamically significant stenosis. A1 segments widely patent. Normal anterior communicating artery. Anterior cerebral arteries widely patent to their distal aspects without stenosis. No M1 stenosis or occlusion. Normal MCA bifurcations. Distal MCA branches well perfused and symmetric. Posterior circulation: Vertebral arteries widely patent to the vertebrobasilar junction without stenosis. Right vertebral artery dominant. Posteroinferior cerebral arteries patent bilaterally. 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 Basilar widely  patent to its distal aspect without stenosis. Superior cerebellar and posterior cerebral arteries widely patent bilaterally. Venous sinuses: Patent. Anatomic variants: None significant. Review of the MIP images confirms the above findings CT Brain Perfusion Findings: ASPECTS: 10 CBF (<30%) Volume: 67mL Perfusion (Tmax>6.0s) volume: 47mL Mismatch Volume: 3mL Infarction Location:Negative CT perfusion with no evidence for core infarct or perfusion abnormality. IMPRESSION: 1. Negative CTA of the head and neck with no evidence for large vessel occlusion. 2. Negative CT perfusion, with no evidence for acute core  infarct or perfusion abnormality. 3. Mild atherosclerotic change about the left carotid bifurcation with associated mild 35% stenosis. 4. No other hemodynamically significant or correctable stenosis seen about the major arterial vasculature of the head and neck. A telephone call to the ordering clinician, Dr. Ruffin Frederick was made at 2:45 a.m. on 12/10/2018. A message was left with the clinician conveying these results. Electronically Signed   By: Jeannine Boga M.D.   On: 12/10/2018 02:51   Mr Brain Wo Contrast  Result Date: 12/10/2018 CLINICAL DATA:  TIA, initial exam. Acute onset of slurred speech and difficulty with word finding beginning at 9 o'clock p.m. last night. EXAM: MRI HEAD WITHOUT CONTRAST TECHNIQUE: Multiplanar, multiecho pulse sequences of the brain and surrounding structures were obtained without intravenous contrast. COMPARISON:  CTA head and neck and CT perfusion 12/10/2018 FINDINGS: Brain: The diffusion-weighted images demonstrate restricted diffusion consistent with an acute nonhemorrhagic infarct in the posterior limb of the left internal capsule. The area of infarction there is linear measuring 11 mm. Moderate confluent periventricular and scattered subcortical T2 hyperintensities are present bilaterally. Dilated perivascular spaces are present throughout the basal ganglia. The  ventricles are proportionate to the degree of atrophy. Corpus callosum is normal. No significant extraaxial fluid collection is present. The internal auditory canals are within normal limits. The brainstem and cerebellum are within normal limits. Vascular: Flow is present in the major intracranial arteries. Skull and upper cervical spine: The craniocervical junction is normal. Upper cervical spine is within normal limits. Marrow signal is unremarkable. Sinuses/Orbits: The paranasal sinuses and mastoid air cells are clear. Bilateral lens replacements are noted. Globes and orbits are otherwise unremarkable. IMPRESSION: 1. 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 2. Moderate diffuse confluent periventricular and scattered subcortical T2 hyperintensities bilaterally consistent with chronic microvascular ischemic change. Electronically Signed   By: San Morelle M.D.   On: 12/10/2018 05:13   Ct Cerebral Perfusion W Contrast  Result Date: 12/10/2018 CLINICAL DATA:  Initial evaluation for acute stroke. Slurred speech. EXAM: CT ANGIOGRAPHY HEAD AND NECK CT PERFUSION BRAIN TECHNIQUE: Multidetector CT imaging of the head and neck was performed using the standard protocol during bolus administration of intravenous contrast. Multiplanar CT image reconstructions and MIPs were obtained to evaluate the vascular anatomy. Carotid stenosis measurements (when applicable) are obtained utilizing NASCET criteria, using the distal internal carotid diameter as the denominator. Multiphase CT imaging of the brain was performed following IV bolus contrast injection. Subsequent parametric perfusion maps were calculated using RAPID software. CONTRAST:  119mL OMNIPAQUE IOHEXOL 350 MG/ML SOLN COMPARISON:  Prior CT from earlier the same day. FINDINGS: CTA NECK FINDINGS Aortic arch: Visualized aortic arch normal in caliber with normal 3 vessel morphology. Mild atheromatous  plaque along the undersurface of the arch and about the origin of the great vessels without hemodynamically significant stenosis. Visualized subclavian arteries widely patent. Right carotid system: Right CCA patent from its origin to the bifurcation without stenosis. Mild calcified plaque about the right bifurcation/proximal right ICA without hemodynamically significant stenosis. Right ICA widely patent distally to the skull base without stenosis, dissection or occlusion. Left carotid system: Left CCA patent from its origin to the bifurcation without stenosis. Bulky calcified plaque about the left bifurcation/proximal left ICA with associated mild stenosis of up to approximately 35% by NASCET criteria. Left ICA widely patent distally to the skull base without stenosis, dissection, or occlusion. Vertebral arteries: Both vertebral arteries arise from the subclavian arteries. Right vertebral artery  dominant. Vertebral arteries widely patent within the neck without stenosis, dissection, or occlusion. Skeleton: No acute osseous finding. No discrete lytic or blastic osseous lesions. Moderate cervical spondylolysis noted at C3-4 through C6-7. Other neck: No other acute soft tissue abnormality within the neck. Thyroid appears to be absent. Upper chest: Visualized upper chest demonstrates no acute finding. Scattered pleuroparenchymal thickening with linear scarring noted within the right greater than left upper lobes. Review of the MIP images confirms the above findings CTA HEAD FINDINGS Anterior circulation: Petrous segments widely patent bilaterally. Mild atheromatous plaque within the cavernous/supraclinoid ICAs without hemodynamically significant stenosis. A1 segments widely patent. Normal anterior communicating artery. Anterior cerebral arteries widely patent to their distal aspects without stenosis. No M1 stenosis or occlusion. Normal MCA bifurcations. Distal MCA branches well perfused and symmetric. Posterior  circulation: Vertebral arteries widely patent to the vertebrobasilar junction without stenosis. Right vertebral artery dominant. Posteroinferior cerebral arteries patent bilaterally. 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 Basilar widely patent to its distal aspect without stenosis. Superior cerebellar and posterior cerebral arteries widely patent bilaterally. Venous sinuses: Patent. Anatomic variants: None significant. Review of the MIP images confirms the above findings CT Brain Perfusion Findings: ASPECTS: 10 CBF (<30%) Volume: 45mL Perfusion (Tmax>6.0s) volume: 87mL Mismatch Volume: 73mL Infarction Location:Negative CT perfusion with no evidence for core infarct or perfusion abnormality. IMPRESSION: 1. Negative CTA of the head and neck with no evidence for large vessel occlusion. 2. Negative CT perfusion, with no evidence for acute core infarct or perfusion abnormality. 3. Mild atherosclerotic change about the left carotid bifurcation with associated mild 35% stenosis. 4. No other hemodynamically significant or correctable stenosis seen about the major arterial vasculature of the head and neck. A telephone call to the ordering clinician, Dr. Ruffin Frederick was made at 2:45 a.m. on 12/10/2018. A message was left with the clinician conveying these results. Electronically Signed   By: Jeannine Boga M.D.   On: 12/10/2018 02:51   Ct Head Code Stroke Wo Contrast  Result Date: 12/10/2018 CLINICAL DATA:  Code stroke. Initial evaluation for acute slurred speech. EXAM: CT HEAD WITHOUT CONTRAST TECHNIQUE: Contiguous axial images were obtained from the base of the skull through the vertex without intravenous contrast. COMPARISON:  None available. FINDINGS: Brain: Moderate age-related cerebral atrophy. Patchy and confluent hypodensity involving the periventricular deep white matter both cerebral hemispheres most consistent with chronic microvascular ischemic disease, moderately advanced. No  acute intracranial hemorrhage. No acute large vessel territory infarct. No mass lesion, midline shift or mass effect. No hydrocephalus. No extra-axial fluid collection. Vascular: No hyperdense vessel. Calcified atherosclerosis present at skull base. Skull: Scalp soft tissues demonstrate no acute finding. Calvarium intact. Sinuses/Orbits: Globes and orbital soft tissues within normal limits. Visualized paranasal sinuses are clear. No mastoid effusion. Other: None. ASPECTS [facility] Stroke Program Early CT Score) - Ganglionic level infarction (caudate, lentiform nuclei, internal capsule, insula, M1-M3 cortex): 7 - Supraganglionic infarction (M4-M6 cortex): 3 Total score (0-10 with 10 being normal): 10 IMPRESSION: 1. No acute intracranial infarct or other abnormality identified. 2. ASPECTS is 10. 3. Moderately advanced cerebral atrophy with chronic microvascular ischemic disease. Critical Value/emergent results were called by telephone at the time of interpretation on 12/10/2018 at 12:33 am to Dr. Karma Greaser, who verbally acknowledged these results. Electronically Signed   By: Jeannine Boga M.D.   On: 12/10/2018 00:38    EKG:   Orders placed or performed during the hospital encounter of 12/10/18  . ED EKG  . ED EKG  .  EKG 12-Lead  . 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  Speech therapy recommends n.p.o. with frequent oral hygiene, follow-up speech therapy recommendations today.  Continue swallowing exercises, continue aspiration precautions. 2.  Physical therapy recommends inpatient rehab.  #3 .diabetes mellitus type 2: Continue sliding scale insulin with coverage. 4.  Hypothyroidism: Continue Synthyroid orally when able to take food by mouth.   #5 appreciate speech therapy  following the patient, All the records are reviewed and case discussed with Care Management/Social Workerr. Management plans discussed with the patient, family and they are in agreement.  CODE STATUS: Full code  TOTAL TIME TAKING CARE OF THIS PATIENT: 38 minutes.   POSSIBLE D/C IN 2 3 DAYS, DEPENDING ON CLINICAL CONDITION.   Epifanio Lesches M.D on 12/11/2018 at 10:35 AM  Between 7am to 6pm - Pager - [phone]  After 6pm go to [email or web address] - password [email or web address]  Office  [phone]  CC: Primary care physician; Dion Body, MD   Note: This dictation was prepared with Dragon dictation along with smaller phrase technology. Any transcriptional errors that result from this process are unintentional.

## 2018-12-11 NOTE — Progress Notes (Signed)
Occupational Therapy Treatment Patient Details Name: Meghan Dougherty MRN: [id number] DOB: May 24, 1940 Today's Date: 12/11/2018    History of present illness Pt is a 78 y.o. female presenting to hospital 12/10/18 with slurred speech starting about 4 pm 9/1 and then had a fall around 2130 when R knee gave out; pt also noted with R facial droop, R sided weakness, expressive aphasia, and dysarthria.  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  PMH includes R breast CA with h/o radiation treatments and partial mastectomy 01/04/2017, DM, htn, and h/o thyroid surgery.   OT comments  Pt. was alert, and able to participate during the session. Pt. was able to keep her eyes open throughout the session, however reports heaviness in her eyes with keeping them open. Right shoulder strength 3+/5 shoulder flexion, abduction which has improved since the last session. Pt. Assisted with using her right hand during light grooming tasks. Pt. Worked on functional reaching with her RUE. Pt. education was provided about increasing opportunities to engage her right hand during daily ADL,and IADL tasks. Pt. continues to benefit from OT services for RUE neuromuscular re-education, ADL training, A/E training, and pt. education about home modification, and DME. Pt. Continues to be appropriate for CIR level of care.   Follow Up Recommendations  CIR    Equipment Recommendations  3 in 1 bedside commode    Recommendations for Other Services      Precautions / Restrictions Precautions Precautions: Fall Precaution Comments: NPO Restrictions Weight Bearing Restrictions: No       Mobility Bed Mobility      Deferred            Transfers    Deferred                    Balance                                           ADL either performed or assessed with clinical judgement   ADL   Eating/Feeding: NPO    Grooming: Moderate assistance   Upper Body Bathing: Moderate assistance   Lower Body Bathing: Moderate assistance   Upper Body Dressing : Moderate assistance   Lower Body Dressing: Moderate assistance                       Vision Baseline Vision/History: Wears glasses Wears Glasses: Reading only Patient Visual Report: No change from baseline;Other (comment)     Perception     Praxis      Cognition Arousal/Alertness: Awake/alert Behavior During Therapy: WFL for tasks assessed/performed Overall Cognitive Status: Within Functional Limits for tasks assessed                                          Exercises     Shoulder Instructions       General Comments      Pertinent Vitals/ Pain       Pain Assessment: No/denies pain  Home Living  Prior Functioning/Environment              Frequency  Min 3X/week        Progress Toward Goals  OT Goals(current goals can now be found in the care plan section)  Progress towards OT goals: OT to reassess next treatment  Acute Rehab OT Goals Patient Stated Goal: To regiain the use of her dominant RUE. OT Goal Formulation: With patient Potential to Achieve Goals: Good  Plan      Co-evaluation                 AM-PAC OT "6 Clicks" Daily Activity     Outcome Measure   Help from another person eating meals?: Total(NPO) Help from another person taking care of personal grooming?: A Lot Help from another person toileting, which includes using toliet, bedpan, or urinal?: A Lot Help from another person bathing (including washing, rinsing, drying)?: A Lot Help from another person to put on and taking off regular upper body clothing?: A Lot Help from another person to put on and taking off regular lower body clothing?: A Lot 6 Click Score: 11    End of Session Equipment Utilized During Treatment: Gait belt  OT Visit Diagnosis:  History of falling (Z91.81);Muscle weakness (generalized) (M62.81)   Activity Tolerance Patient tolerated treatment well   Patient Left in bed;with call bell/phone within reach;with bed alarm set   Nurse Communication          Time: 1002-1030 OT Time Calculation (min): 28 min  Charges: OT General Charges $OT Visit: 1 Visit OT Treatments $Self Care/Home Management : 23-37 mins  Harrel Carina, MS, OTR/L  Harrel Carina 12/11/2018, 10:38 AM

## 2018-12-11 NOTE — Plan of Care (Signed)
Sleepy but arousable after IV Ativan by previous nurse.  No new deficits noted on arousal.

## 2018-12-12 ENCOUNTER — Inpatient Hospital Stay: Payer: Medicare Other

## 2018-12-12 MED ORDER — ASPIRIN 325 MG PO TABS
325.0000 mg | ORAL_TABLET | Freq: Every day | ORAL | Status: DC
  Administered 2018-12-13 – 2018-12-19 (×7): 325 mg via ORAL
  Filled 2018-12-12 (×7): qty 1

## 2018-12-12 NOTE — Progress Notes (Signed)
Inpatient Rehabilitation Admissions Coordinator  Inpatient rehab consult received.I reviewed chart and contacted pt's son , [name] at pt's bedside. We discussed goals and expectations of an inpt rehab admit. He prefers the [facility] as first choice for he lives in [place] and is her contact. If [facility] is not available, he would then want to proceed with a possible [facility] CIR admit. I have left voicemail for [place], [place] of son's preference for [facility]. I await clarification before proceeding with [facility] Medicare for a possible admit Tuesday pending insurance approval. Insurance likely to be closed on Monday for the Holiday. I will follow up on Monday.  Danne Baxter, RN, MSN Rehab Admissions Coordinator [phone] 12/12/2018 3:24 PM

## 2018-12-12 NOTE — Progress Notes (Signed)
PT Cancellation Note  Patient Details Name: Meghan Dougherty MRN: [id number] DOB: [birth year]   Cancelled Treatment:    Reason Eval/Treat Not Completed: Other (comment).  Pt currently participating in SLP session.  Will re-attempt PT session at a later date/time.  Leitha Bleak, PT 12/12/18, 2:12 PM [phone]

## 2018-12-12 NOTE — TOC Progression Note (Signed)
Transition of Care [facility]) - Progression Note    Patient Details  Name: Mekyla Tomkiewicz MRN: [id number] Date of Birth: 1940/10/04  Transition of Care [facility]) CM/SW [place], LCSW Phone Number: 12/12/2018, 3:53 PM  Clinical Narrative: CSW spoke to [facility] inpatient rehab and faxed over referral.    Expected Discharge Plan: [place] Barriers to Discharge: Continued Medical Work up  Expected Discharge Plan and Services Expected Discharge Plan: [place] In-house Referral: Clinical Social Work   Post Acute Care Choice: [place] arrangements for the past 2 months: Single Family Home                                       Social Determinants of Health (SDOH) Interventions    Readmission Risk Interventions No flowsheet data found.

## 2018-12-12 NOTE — Evaluation (Signed)
Speech Language Pathology Evaluation Patient Details Name: Meghan Dougherty MRN: [id number] DOB: [birth year] Today's Date: 12/12/2018 Time: 1330-1400 SLP Time Calculation (min) (ACUTE ONLY): 30 min  Problem List:  Patient Active Problem List   Diagnosis Date Noted  . TIA (transient ischemic attack) 12/10/2018  . Primary cancer of upper inner quadrant of right female breast ([place]) 12/25/2016   Past Medical History:  Past Medical History:  Diagnosis Date  . Borderline diabetes   . Breast cancer, right ([place]) 11/2016   invasive mammary carcinoma, Lumpectomy and rad tx's.   . Colon polyp   . Diabetes mellitus without complication ([place])    type 2  . Hyperlipemia   . Hypertension   . Hyperthyroidism   . Hypothyroidism   . Insomnia   . Osteopenia   . Personal history of radiation therapy 2018   right breast cancer   Past Surgical History:  Past Surgical History:  Procedure Laterality Date  . ABDOMINAL HYSTERECTOMY     oophorectomy  . APPENDECTOMY    . BREAST BIOPSY Left    neg core  . BREAST BIOPSY Right    neg  core  . BREAST BIOPSY Right 11/2016   invasive mammary carcinoma, [place]  . BREAST BIOPSY Right 11/2016   benign, stero  . BREAST LUMPECTOMY Right 12/2016   invasive mammary carcinoma  Negative margins  . CATARACT EXTRACTION W/ INTRAOCULAR LENS IMPLANT    . CHOLECYSTECTOMY    . COLONOSCOPY    . COLONOSCOPY WITH PROPOFOL N/A 01/15/2018   Procedure: COLONOSCOPY WITH PROPOFOL;  Surgeon: Toledo, Benay Pike, MD;  Location: [facility] ENDOSCOPY;  Service: Gastroenterology;  Laterality: N/A;  . EYE SURGERY     bilateral cataracts  . OOPHORECTOMY    . PARTIAL MASTECTOMY WITH NEEDLE LOCALIZATION Right 01/04/2017   Procedure: PARTIAL MASTECTOMY WITH NEEDLE LOCALIZATION;  Surgeon: Leonie Green, MD;  Location: [facility] ORS;  Service: General;  Laterality: Right;  . SENTINEL NODE BIOPSY Right 01/04/2017   Procedure: SENTINEL NODE BIOPSY;  Surgeon: Leonie Green, MD;   Location: [facility] ORS;  Service: General;  Laterality: Right;  . THYROID SURGERY     total thyroidectomy  . TONSILLECTOMY    . TOTAL THYROIDECTOMY     HPI:  Pt is a 78 y.o. female with Diabetes, prior R breast cancer w/ XRT, hypertension, hyperlipidemia who presents with slurred speech and R sided weakness.  Patient says she developed slurred speech around 4 PM on 12/09/2018 while she was talking to her friend on the phone.  She then had a fall when her right knee gave out.  She denies any pain currently.  She continued to present w/ slurred speech and difficulty with thinking of the words at the ED but stated it was improved from prior.  Also noted is continued R sided weakness.  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     Assessment / Plan / Recommendation Clinical Impression  The patient is presenting with mild aphasia characterized by reduced information content and fluency of spontaneous speech and reduced comprehension for complex/abstract verbal information.  She is managing her speech intelligibility very well, most of her utterances were intelligible and she could improve intelligibility independently when cued she was not understood.  The patient will benefit from skilled speech therapy to assess and treat language skills in her next setting as well as dysphagia and dysarthria.    SLP Assessment  SLP Visit Diagnosis: Aphasia (R47.01);Dysarthria  and anarthria (R47.1)    Follow Up Recommendations  Inpatient Rehab    Frequency and Duration min 3x week         SLP Evaluation Cognition          Comprehension  Auditory Comprehension Overall Auditory Comprehension: Impaired    Expression Verbal Expression Overall Verbal Expression: Impaired   Oral / Motor  Oral Motor/Sensory Function Overall Oral Motor/Sensory Function: Moderate impairment Facial ROM: Reduced right;Suspected CN VII (facial)  dysfunction Facial Symmetry: Abnormal symmetry right;Suspected CN VII (facial) dysfunction Facial Strength: Reduced right;Suspected CN VII (facial) dysfunction Facial Sensation: Reduced right;Suspected CN V (Trigeminal) dysfunction Lingual ROM: Suspected CN XII (hypoglossal) dysfunction;Reduced right(decreased ROM to Left side(crossing)) Lingual Symmetry: Abnormal symmetry right;Suspected CN XII (hypoglossal) dysfunction Lingual Strength: Reduced;Suspected CN XII (hypoglossal) dysfunction Lingual Sensation: Reduced;Suspected CN VII (facial) dysfunction-anterior 2/3 tongue Mandible: Within Functional Limits Motor Speech Overall Motor Speech: Impaired   GO                   Leroy Sea, MS/CCC- [place], Daine Floras 12/12/2018, 5:14 PM

## 2018-12-12 NOTE — Care Management Important Message (Signed)
Important Message  Patient Details  Name: Corlyn Dever MRN: [id number] Date of Birth: 11/12/1940   Medicare Important Message Given:  Yes     Dannette Barbara 12/12/2018, 11:15 AM

## 2018-12-12 NOTE — Progress Notes (Signed)
Inpatient Rehab Admissions Coordinator:   Pt continues to be appropriate for CIR.  Please place consult order if pt would like to be considered.   Shann Medal, PT, DPT Admissions Coordinator [phone] 12/12/18  1:03 PM

## 2018-12-12 NOTE — Progress Notes (Signed)
PT Cancellation Note  Patient Details Name: Meghan Dougherty MRN: [id number] DOB: 1941/02/17   Cancelled Treatment:    Reason Eval/Treat Not Completed: Other (comment).  Attempted to see pt again this afternoon but pt on bed pan toileting.  When therapist attempted again to initiate PT session, IV team arrived to place new IV.  Will re-attempt PT session at a later date/time.  Leitha Bleak, PT 12/12/18, 3:52 PM [phone]

## 2018-12-12 NOTE — Progress Notes (Signed)
[place] [facility] [place] NAME: Meghan Dougherty    MR#:  [id number]  DATE OF BIRTH:  Mar 16, 1941 Patient dysarthria improved, able to talk little better today.  Status post MBS showing high risk for aspiration, started on dysphagia 2 diet with honey thick liquids, patient is still appropriate for inpatient rehab, appreciate social worker/case management help with that. Chief Complaint  Patient presents with  . Code Stroke    REVIEW OF SYSTEMS:   ROS CONSTITUTIONAL: No fever, fatigue or weakness.  EYES: No blurred or double vision.  EARS, NOSE, AND THROAT: No tinnitus or ear pain.  RESPIRATORY: No cough, shortness of breath, wheezing or hemoptysis.  CARDIOVASCULAR: No chest pain, orthopnea, edema.  GASTROINTESTINAL: No nausea, vomiting, diarrhea or abdominal pain.  GENITOURINARY: No dysuria, hematuria.  ENDOCRINE: No polyuria, nocturia,  HEMATOLOGY: No anemia, easy bruising or bleeding SKIN: No rash or lesion. MUSCULOSKELETAL: No joint pain or arthritis.   NEUROLOGIC: No tingling, numbness, weakness.  Patient has difficulty getting the words out and slurred speech noted. PSYCHIATRY: No anxiety or depression.   DRUG ALLERGIES:   Allergies  Allergen Reactions  . Levaquin [Levofloxacin] Rash  . Penicillins Rash    Has patient had a PCN reaction causing immediate rash, facial/tongue/throat swelling, SOB or lightheadedness with hypotension: No Has patient had a PCN reaction causing severe rash involving mucus membranes or skin necrosis: No Has patient had a PCN reaction that required hospitalization: No Has patient had a PCN reaction occurring within the last 10 years: No If all of the above answers are "NO", then may proceed with Cephalosporin use.   . Trazodone Other (See Comments)    Headache and nightmares    VITALS:  Blood pressure (!) 159/75, pulse 88, temperature 98.5 F (36.9 C), resp. rate 18, height 5' (1.524 m), weight  74.5 kg, SpO2 98 %.  PHYSICAL EXAMINATION:  GENERAL:  78 y.o.-year-old patient lying in the bed with no acute distress.  Has expressive dysarthria. EYES: Pupils equal, round, reactive to light . No scleral icterus. Extraocular muscles intact.  HEENT: Head atraumatic, normocephalic. Oropharynx and nasopharynx clear.  NECK:  Supple, no jugular venous distention. No thyroid enlargement, no tenderness.  LUNGS: Patient noted to have some cough.  Breath sounds are clear.    CARDIOVASCULAR: S1, S2 normal. No murmurs, rubs, or gallops.  ABDOMEN: Soft, nontender, nondistended. Bowel sounds present. No organomegaly or mass.  EXTREMITIES: No pedal edema, cyanosis, or clubbing.  NEUROLOGIC: Patient is alert, oriented, Dysarthric, cranial nerves II through XII are intact except for right facial droop, diminished strength in right upper and lower extremity 4 out of 5, left upper and lower 5 out of 5 in strength.  Tone is normal.  Sensations are intact, DTR 1+ symmetric throughout.   PSYCHIATRIC: The patient is alert and oriented x 3.  SKIN: No obvious rash, lesion, or ulcer.    LABORATORY PANEL:   CBC Recent Labs  Lab 12/10/18 0034  WBC 9.5  HGB 13.8  HCT 50.5*  PLT 300   ------------------------------------------------------------------------------------------------------------------  Chemistries  Recent Labs  Lab 12/10/18 0034  NA 132*  K 3.7  CL 96*  CO2 23  GLUCOSE 133*  BUN 15  CREATININE 0.89  CALCIUM 9.3  AST 31  ALT 21  ALKPHOS 76  BILITOT 0.6   ------------------------------------------------------------------------------------------------------------------  Cardiac Enzymes No results for input(s): TROPONINI in the last 168 hours. ------------------------------------------------------------------------------------------------------------------  RADIOLOGY:  No results found.  EKG:   Orders  placed or performed during the hospital encounter of 12/10/18  . ED EKG   . ED EKG  . EKG 12-Lead  . 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 She has a mod+ pharyngeal swallow initation delay and needs to be on Honey consistency liquids for their slowness (giving her time to close the airway when swallowing). 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 and the material comes back up to the pyriform sinuses and can increase RISK for Aspiration  Continue dysphagia 2 diet.  2.  Physical therapy recommends inpatient rehab.  #3 .diabetes mellitus type 2: Continue sliding scale insulin with coverage. 4.  Hypothyroidism: Continue Synthyroid orally when able to take food by mouth.   #5 .appreciate speech therapy following the patient, All the records are reviewed and case discussed with Care Management/Social Workerr. Management plans discussed with the patient, family and they are in agreement.  CODE STATUS: Full code  TOTAL TIME TAKING CARE OF THIS PATIENT: 38 minutes.   Discharge to CIR when the bed is available.. Patient is medically ready today  Epifanio Lesches M.D on 12/12/2018 at 1:05 PM  Between 7am to 6pm - Pager - [phone]  After 6pm go to [email or web address] - password [email or web address] [facility]  Office  [phone]  CC: Primary care physician; Dion Body, MD   Note: This dictation was prepared with Dragon dictation along with smaller phrase technology. Any transcriptional errors that result from this process are unintentional.

## 2018-12-12 NOTE — Consult Note (Signed)
Subjective: Still difficulty swallowing s/p barrium swallow   Past Medical History:  Diagnosis Date  . Borderline diabetes   . Breast cancer, right ([place]) 11/2016   invasive mammary carcinoma, Lumpectomy and rad tx's.   . Colon polyp   . Diabetes mellitus without complication ([place])    type 2  . Hyperlipemia   . Hypertension   . Hyperthyroidism   . Hypothyroidism   . Insomnia   . Osteopenia   . Personal history of radiation therapy 2018   right breast cancer    Past Surgical History:  Procedure Laterality Date  . ABDOMINAL HYSTERECTOMY     oophorectomy  . APPENDECTOMY    . BREAST BIOPSY Left    neg core  . BREAST BIOPSY Right    neg  core  . BREAST BIOPSY Right 11/2016   invasive mammary carcinoma, [place]  . BREAST BIOPSY Right 11/2016   benign, stero  . BREAST LUMPECTOMY Right 12/2016   invasive mammary carcinoma  Negative margins  . CATARACT EXTRACTION W/ INTRAOCULAR LENS IMPLANT    . CHOLECYSTECTOMY    . COLONOSCOPY    . COLONOSCOPY WITH PROPOFOL N/A 01/15/2018   Procedure: COLONOSCOPY WITH PROPOFOL;  Surgeon: Toledo, Benay Pike, MD;  Location: [facility] ENDOSCOPY;  Service: Gastroenterology;  Laterality: N/A;  . EYE SURGERY     bilateral cataracts  . OOPHORECTOMY    . PARTIAL MASTECTOMY WITH NEEDLE LOCALIZATION Right 01/04/2017   Procedure: PARTIAL MASTECTOMY WITH NEEDLE LOCALIZATION;  Surgeon: Leonie Green, MD;  Location: [facility] ORS;  Service: General;  Laterality: Right;  . SENTINEL NODE BIOPSY Right 01/04/2017   Procedure: SENTINEL NODE BIOPSY;  Surgeon: Leonie Green, MD;  Location: [facility] ORS;  Service: General;  Laterality: Right;  . THYROID SURGERY     total thyroidectomy  . TONSILLECTOMY    . TOTAL THYROIDECTOMY      Family History  Problem Relation Age of Onset  . Breast cancer Sister 54  . Lung cancer Sister   . Diabetes Sister   . Breast cancer Maternal Aunt   . Diabetes Mother   . Diabetes Father   . Diabetes Brother   . Diabetes Sister   .  Diabetes Sister   . Diabetes Brother     Social History:  reports that she has never smoked. She has never used smokeless tobacco. She reports current alcohol use. She reports that she does not use drugs.  Allergies  Allergen Reactions  . Levaquin [Levofloxacin] Rash  . Penicillins Rash    Has patient had a PCN reaction causing immediate rash, facial/tongue/throat swelling, SOB or lightheadedness with hypotension: No Has patient had a PCN reaction causing severe rash involving mucus membranes or skin necrosis: No Has patient had a PCN reaction that required hospitalization: No Has patient had a PCN reaction occurring within the last 10 years: No If all of the above answers are "NO", then may proceed with Cephalosporin use.   . Trazodone Other (See Comments)    Headache and nightmares    Medications: I have reviewed the patient's current medications.  ROS: History obtained from the patient  General ROS: negative for - chills, fatigue, fever, night sweats, weight gain or weight loss Psychological ROS: negative for - behavioral disorder, hallucinations, memory difficulties, mood swings or suicidal ideation Ophthalmic ROS: negative for - blurry vision, double vision, eye pain or loss of vision ENT ROS: negative for - epistaxis, nasal discharge, oral lesions, sore throat, tinnitus or vertigo Allergy and Immunology ROS: negative  for - hives or itchy/watery eyes Hematological and Lymphatic ROS: negative for - bleeding problems, bruising or swollen lymph nodes Endocrine ROS: negative for - galactorrhea, hair pattern changes, polydipsia/polyuria or temperature intolerance Respiratory ROS: negative for - cough, hemoptysis, shortness of breath or wheezing Cardiovascular ROS: negative for - chest pain, dyspnea on exertion, edema or irregular heartbeat Gastrointestinal ROS: negative for - abdominal pain, diarrhea, hematemesis, nausea/vomiting or stool incontinence Genito-Urinary ROS: negative  for - dysuria, hematuria, incontinence or urinary frequency/urgency Musculoskeletal ROS: negative for - joint swelling or muscular weakness Neurological ROS: as noted in HPI Dermatological ROS: negative for rash and skin lesion changes  Physical Examination: Blood pressure (!) 159/75, pulse 88, temperature 98.5 F (36.9 C), resp. rate 18, height 5' (1.524 m), weight 74.5 kg, SpO2 98 %.   Neurological Examination   Mental Status: Alert, oriented, thought content appropriate.  Dysarthria with occasional word finding difficulty.  Cranial Nerves: II: Discs flat bilaterally; Visual fields grossly normal, pupils equal, round, reactive to light and accommodation III,IV, VI: ptosis not present, extra-ocular motions intact bilaterally V,VII: R facial droop  VIII: hearing normal bilaterally IX,X: gag reflex present XI: bilateral shoulder shrug XII: midline tongue extension Motor: Right : Upper extremity   4/5    Left:     Upper extremity   5/5  Lower extremity   4+/5     Lower extremity   5/5 Tone and bulk:normal tone throughout; no atrophy noted Sensory: Pinprick and light touch intact throughout, bilaterally Deep Tendon Reflexes: 1+ and symmetric throughout Plantars: Right: downgoing   Left: downgoing Cerebellar: normal finger-to-nose Gait: not tested      Laboratory Studies:   Basic Metabolic Panel: Recent Labs  Lab 12/10/18 0034  NA 132*  K 3.7  CL 96*  CO2 23  GLUCOSE 133*  BUN 15  CREATININE 0.89  CALCIUM 9.3    Liver Function Tests: Recent Labs  Lab 12/10/18 0034  AST 31  ALT 21  ALKPHOS 76  BILITOT 0.6  PROT 7.4  ALBUMIN 4.3   No results for input(s): LIPASE, AMYLASE in the last 168 hours. No results for input(s): AMMONIA in the last 168 hours.  CBC: Recent Labs  Lab 12/10/18 0034  WBC 9.5  NEUTROABS 6.6  HGB 13.8  HCT 50.5*  MCV 99.3  PLT 300    Cardiac Enzymes: No results for input(s): CKTOTAL, CKMB, CKMBINDEX, TROPONINI in the last 168  hours.  BNP: Invalid input(s): POCBNP  CBG: No results for input(s): GLUCAP in the last 168 hours.  Microbiology: Results for orders placed or performed during the hospital encounter of 12/10/18  SARS Coronavirus 2 [facility] order, Performed in [facility] hospital lab) Nasopharyngeal Nasopharyngeal Swab     Status: None   Collection Time: 12/10/18 12:34 AM   Specimen: Nasopharyngeal Swab  Result Value Ref Range Status   SARS Coronavirus 2 NEGATIVE NEGATIVE Final    Comment: (NOTE) If result is NEGATIVE SARS-CoV-2 target nucleic acids are NOT DETECTED. The SARS-CoV-2 RNA is generally detectable in upper and lower  respiratory specimens during the acute phase of infection. The lowest  concentration of SARS-CoV-2 viral copies this assay can detect is 250  copies / mL. A negative result does not preclude SARS-CoV-2 infection  and should not be used as the sole basis for treatment or other  patient management decisions.  A negative result may occur with  improper specimen collection / handling, submission of specimen other  than nasopharyngeal swab, presence of viral mutation(s) within  the  areas targeted by this assay, and inadequate number of viral copies  (<250 copies / mL). A negative result must be combined with clinical  observations, patient history, and epidemiological information. If result is POSITIVE SARS-CoV-2 target nucleic acids are DETECTED. The SARS-CoV-2 RNA is generally detectable in upper and lower  respiratory specimens dur ing the acute phase of infection.  Positive  results are indicative of active infection with SARS-CoV-2.  Clinical  correlation with patient history and other diagnostic information is  necessary to determine patient infection status.  Positive results do  not rule out bacterial infection or co-infection with other viruses. If result is PRESUMPTIVE POSTIVE SARS-CoV-2 nucleic acids MAY BE PRESENT.   A presumptive positive result was obtained  on the submitted specimen  and confirmed on repeat testing.  While 2019 novel coronavirus  (SARS-CoV-2) nucleic acids may be present in the submitted sample  additional confirmatory testing may be necessary for epidemiological  and / or clinical management purposes  to differentiate between  SARS-CoV-2 and other Sarbecovirus currently known to infect humans.  If clinically indicated additional testing with an alternate test  methodology [phone]) is advised. The SARS-CoV-2 RNA is generally  detectable in upper and lower respiratory sp ecimens during the acute  phase of infection. The expected result is Negative. Fact Sheet for Patients:  StrictlyIdeas.no Fact Sheet for Healthcare Providers: BankingDealers.co.za This test is not yet approved or cleared by the [place] FDA and has been authorized for detection and/or diagnosis of SARS-CoV-2 by FDA under an Emergency Use Authorization (EUA).  This EUA will remain in effect (meaning this test can be used) for the duration of the COVID-19 declaration under Section 564(b)(1) of the Act, 21 U.S.C. section 360bbb-3(b)(1), unless the authorization is terminated or revoked sooner. Performed at [facility], [address] [place], [place] [address]     Coagulation Studies: Recent Labs    12/10/18 0034  LABPROT 12.6  INR 1.0    Urinalysis: No results for input(s): COLORURINE, LABSPEC, PHURINE, GLUCOSEU, HGBUR, BILIRUBINUR, KETONESUR, PROTEINUR, UROBILINOGEN, NITRITE, LEUKOCYTESUR in the last 168 hours.  Invalid input(s): APPERANCEUR  Lipid Panel:     Component Value Date/Time   CHOL 193 12/10/2018 0505   TRIG 131 12/10/2018 0505   HDL 65 12/10/2018 0505   CHOLHDL 3.0 12/10/2018 0505   VLDL 26 12/10/2018 0505   LDLCALC 102 (H) 12/10/2018 0505    HgbA1C:  Lab Results  Component Value Date   HGBA1C 6.1 (H) 12/10/2018    Urine Drug Screen:  No results found for:  LABOPIA, COCAINSCRNUR, LABBENZ, AMPHETMU, THCU, LABBARB  Alcohol Level: No results for input(s): ETH in the last 168 hours.  Other results: EKG: normal EKG, normal sinus rhythm, unchanged from previous tracings.  Imaging: No results found.   Assessment/Plan:  78 y.o. female  with a known history of type 2 diabetes mellitus, breast cancer, hypertension, dyslipidemia and hypothyroidism, presented to the emergency room with onset of slurred speech that started at 4 PM on 12/09/2018, with right facial droop and mild weakness on the right side.  Pt is on ASA 81mg  daily. 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 She also has chronic small vessel changes consistent with chronic small vessel disease - CTA head and neck no vessel stenosis and patent carotids - On ASA -  Honey thick diet as sphinter stenosis with higher chace of aspiration . - call with questions  12/12/2018, 10:55 AM

## 2018-12-12 NOTE — Evaluation (Signed)
Objective Swallowing Evaluation: Type of Study: MBS-Modified Barium Swallow Study   Patient Details  Name: Meghan Dougherty MRN: [id number] Date of Birth: 1941-01-31  Today's Date: 12/12/2018 Time: SLP Start Time (ACUTE ONLY): [id number] -SLP Stop Time (ACUTE ONLY): 1035  SLP Time Calculation (min) (ACUTE ONLY): 60 min   Past Medical History:  Past Medical History:  Diagnosis Date  . Borderline diabetes   . Breast cancer, right ([place]) 11/2016   invasive mammary carcinoma, Lumpectomy and rad tx's.   . Colon polyp   . Diabetes mellitus without complication ([place])    type 2  . Hyperlipemia   . Hypertension   . Hyperthyroidism   . Hypothyroidism   . Insomnia   . Osteopenia   . Personal history of radiation therapy 2018   right breast cancer   Past Surgical History:  Past Surgical History:  Procedure Laterality Date  . ABDOMINAL HYSTERECTOMY     oophorectomy  . APPENDECTOMY    . BREAST BIOPSY Left    neg core  . BREAST BIOPSY Right    neg  core  . BREAST BIOPSY Right 11/2016   invasive mammary carcinoma, [place]  . BREAST BIOPSY Right 11/2016   benign, stero  . BREAST LUMPECTOMY Right 12/2016   invasive mammary carcinoma  Negative margins  . CATARACT EXTRACTION W/ INTRAOCULAR LENS IMPLANT    . CHOLECYSTECTOMY    . COLONOSCOPY    . COLONOSCOPY WITH PROPOFOL N/A 01/15/2018   Procedure: COLONOSCOPY WITH PROPOFOL;  Surgeon: Toledo, Benay Pike, MD;  Location: [facility] ENDOSCOPY;  Service: Gastroenterology;  Laterality: N/A;  . EYE SURGERY     bilateral cataracts  . OOPHORECTOMY    . PARTIAL MASTECTOMY WITH NEEDLE LOCALIZATION Right 01/04/2017   Procedure: PARTIAL MASTECTOMY WITH NEEDLE LOCALIZATION;  Surgeon: Leonie Green, MD;  Location: [facility] ORS;  Service: General;  Laterality: Right;  . SENTINEL NODE BIOPSY Right 01/04/2017   Procedure: SENTINEL NODE BIOPSY;  Surgeon: Leonie Green, MD;  Location: [facility] ORS;  Service: General;  Laterality: Right;  . THYROID SURGERY     total thyroidectomy  . TONSILLECTOMY    . TOTAL THYROIDECTOMY     HPI: Pt is a 78 y.o. female with Diabetes, prior R breast cancer w/ XRT, hypertension, hyperlipidemia who presents with slurred speech and R sided weakness.  Patient says she developed slurred speech around 4 PM on 12/09/2018 while she was talking to her friend on the phone.  She then had a fall when her right knee gave out.  She denies any pain currently.  She continued to present w/ slurred speech and difficulty with thinking of the words at the ED but stated it was improved from prior.  Also noted is continued R sided weakness.  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     Subjective: pt awake, conversive w/ Dysarthria noted. Able to follow through w/ general instructions.     Assessment / Plan / Recommendation  CHL IP CLINICAL IMPRESSIONS 12/12/2018  Clinical Impression Pt appears to present w/ Oropharyngeal phase dysphagia w/ increased risk for aspiration w/ oral intake. With swallowing strategies, aspiration precautions, and a modified diet, this risk is reduced. HOWEVER, pt also exhibits Esophageal phase Dysmotility w/ backflow, or Retrograde Activity, of bolus material from the Esophagus moving superiorly into the pyriform sinuses post swallows. Noted bony protrusions along the Cervical Spine(viewable). This backflow of material into the pharynx which can increase risk for laryngeal  penetration and/or aspiration of such thus lead to Pulmonary decline. During the oral phase, min decreased lingual coordination and strength during bolus mastication requiring min increased time for bolus preparation; min piecemealing and/or min oral residue post initial swallow requiring a f/u, Dry swallow to fully clear. No oral residue noted post final swallow. Good oral control of liquid/all boluses during presentation and oral hold instruction - no premature spillage or  posterior loss noted to occur. During the pharyngeal phase, Moderate-Severe delay in timing of pharyngeal swallow initiation (spilling to and filling the pyriform sinuses) w/ Nectar consistency liquids. This resulted in laryngeal penetration and aspiration of Nectar consistency liquid; pt exhibited a Strong Cough reflex w/ the aspiration of material. Trace/flash laryngeal penetration of Honey consistency liquid noted x1 trial(larger bolus presentation) but No aspiration noted to occur; the penetration appeared to clear w/ completion of the swallow. A more timely pharyngeal swallow initiation (at the Valleculae) was noted w/ the consistencies of Honey liquids, purees, and broken-down/crushed solids. Pt was able to attain full airway closure during the swallow w/ small, single trials of these consistencies. Also, pt was instructed to use a lingual sweep and f/u, Dry swallow to fully clear the oral cavity which aided in clearing any potential pharyngeal residue. However, post the initial swallow of trials, No bolus residue of any significance was noted in the pharynx indicating adequate laryngeal excursion and pharyngeal pressure during the swallow.  Of note, slight-min pyriform sinus residue was noted from backflow, or Retrograde Activity, from the Esophagus b/t trials.   SLP Visit Diagnosis Dysphagia, oropharyngeal phase (R13.12);Dysphagia, pharyngoesophageal phase (R13.14)  Attention and concentration deficit following --  Frontal lobe and executive function deficit following --  Impact on safety and function Moderate aspiration risk;Risk for inadequate nutrition/hydration      CHL IP TREATMENT RECOMMENDATION 12/12/2018  Treatment Recommendations Therapy as outlined in treatment plan below     Prognosis 12/12/2018  Prognosis for Safe Diet Advancement Fair  Barriers to Reach Goals Severity of deficits;Language deficits  Barriers/Prognosis Comment --    CHL IP DIET RECOMMENDATION 12/12/2018  SLP Diet  Recommendations Dysphagia 2 (Fine chop) solids;Honey thick liquids  Liquid Administration via Spoon;Cup;No straw  Medication Administration Crushed with puree  Compensations Minimize environmental distractions;Slow rate;Small sips/bites;Lingual sweep for clearance of pocketing;Multiple dry swallows after each bite/sip;Follow solids with liquid  Postural Changes Remain semi-upright after after feeds/meals (Comment);Seated upright at 90 degrees      CHL IP OTHER RECOMMENDATIONS 12/12/2018  Recommended Consults Consider GI evaluation;Consider esophageal assessment  Oral Care Recommendations Oral care BID;Oral care before and after PO;Staff/trained caregiver to provide oral care  Other Recommendations Order thickener from pharmacy;Prohibited food (jello, ice cream, thin soups);Remove water pitcher;Have oral suction available      CHL IP FOLLOW UP RECOMMENDATIONS 12/12/2018  Follow up Recommendations Inpatient Rehab      CHL IP FREQUENCY AND DURATION 12/12/2018  Speech Therapy Frequency (ACUTE ONLY) min 3x week  Treatment Duration 2 weeks           CHL IP ORAL PHASE 12/12/2018  Oral Phase Impaired  Oral - Pudding Teaspoon --  Oral - Pudding Cup --  Oral - Honey Teaspoon 4  Oral - Honey Cup 1  Oral - Nectar Teaspoon 3  Oral - Nectar Cup --  Oral - Nectar Straw --  Oral - Thin Teaspoon --  Oral - Thin Cup --  Oral - Thin Straw --  Oral - Puree 4  Oral - Mech Soft 2(minced)  Oral - Regular --  Oral - Multi-Consistency --  Oral - Pill --  Oral Phase - Comment min decreased lingual coordination and strength during bolus mastication requiring min increased time for bolus preparation; min piecemealing and/or min oral residue post initial swallow requiring a f/u, Dry swallow to fully clear. No oral residue noted post final swallow. Good oral control of liquid/all boluses during presentation and oral hold instruction - no premature spillage or posterior loss noted to occur    CHL IP PHARYNGEAL  PHASE 12/12/2018  Pharyngeal Phase Impaired  Pharyngeal- Pudding Teaspoon --  Pharyngeal --  Pharyngeal- Pudding Cup --  Pharyngeal --  Pharyngeal- Honey Teaspoon 4  Pharyngeal --  Pharyngeal- Honey Cup 1  Pharyngeal --  Pharyngeal- Nectar Teaspoon 3  Pharyngeal --  Pharyngeal- Nectar Cup --  Pharyngeal --  Pharyngeal- Nectar Straw --  Pharyngeal --  Pharyngeal- Thin Teaspoon --  Pharyngeal --  Pharyngeal- Thin Cup --  Pharyngeal --  Pharyngeal- Thin Straw --  Pharyngeal --  Pharyngeal- Puree 4  Pharyngeal --  Pharyngeal- Mechanical Soft 2(minced)  Pharyngeal --  Pharyngeal- Regular --  Pharyngeal --  Pharyngeal- Multi-consistency --  Pharyngeal --  Pharyngeal- Pill --  Pharyngeal --  Pharyngeal Comment Moderate-Severe delay in timing of pharyngeal swallow initiation (spilling to and filling the pyriform sinuses) w/ Nectar consistency liquids. This resulted in laryngeal penetration and aspiration of Nectar consistency liquid; laryngeal penetration of Honey consistency liquid noted x1 trial(larger bolus presentation) but No aspiration noted to occur. A more timely pharyngeal swallow initiation (at the Valleculae) was noted w/ the consistencies of Honey liquids, purees, and broken-down/crushed solids. Pt was able to attain full airway closure during the swallow w/ small, single trials of these consistencies. Also, pt was instructed to use a lingual sweep and f/u, Dry swallow to fully clear the oral cavity which aided in clearing any potential pharyngeal residue. However, post the initial swallow of trials, No bolus residue of any significance was noted in the pharynx indicating adequate laryngeal excursion and pharyngeal pressure during the swallow.  Of note, slight-min pyriform sinus residue was noted from backflow, or Retrograde Activity, from the Esophagus b/t trials.      CHL IP CERVICAL ESOPHAGEAL PHASE 12/12/2018  Cervical Esophageal Phase Impaired  Pudding Teaspoon --  Pudding  Cup --  Honey Teaspoon --  Honey Cup --  Nectar Teaspoon --  Nectar Cup --  Nectar Straw --  Thin Teaspoon --  Thin Cup --  Thin Straw --  Puree --  Mechanical Soft --  Regular --  Multi-consistency --  Pill --  Cervical Esophageal Comment backflow, or Retrograde Activity, of bolus material flowed from the Esophagus superiorly into the pyriform sinuses post swallows. Noted bony protrusions along the Cervical Spine(viewable). Unable to observe much d/t shoulders obscuring view.          Orinda Kenner, MS, CCC-SLP Watson,Katherine 12/12/2018, 4:16 PM

## 2018-12-12 NOTE — NC FL2 (Signed)
[place] LEVEL OF CARE SCREENING TOOL     IDENTIFICATION  Patient Name: Meghan Dougherty Birthdate: Sep 11, 1940 Sex: female Admission Date (Current Location): 12/10/2018  [place] and [place] Number:  Engineering geologist and Address:  [facility], [address] [place], [place] [address]      Provider Number: [id number]  Attending Physician Name and Address:  Epifanio Lesches, MD  Relative Name and Phone Number:       Current Level of Care: Hospital Recommended Level of Care: [place] Prior Approval Number:    Date Approved/Denied:   PASRR Number: [id number]  Discharge Plan: SNF    Current Diagnoses: Patient Active Problem List   Diagnosis Date Noted  . TIA (transient ischemic attack) 12/10/2018  . Primary cancer of upper inner quadrant of right female breast ([place]) 12/25/2016    Orientation RESPIRATION BLADDER Height & Weight     Self, Time, Situation, Place  Normal Continent Weight: 164 lb 3.9 oz (74.5 kg) Height:  5' (152.4 cm)  BEHAVIORAL SYMPTOMS/MOOD NEUROLOGICAL BOWEL NUTRITION STATUS      Continent Diet(Diet: DYS 2)  AMBULATORY STATUS COMMUNICATION OF NEEDS Skin   Extensive Assist Verbally Normal                       Personal Care Assistance Level of Assistance  Bathing, Feeding, Dressing Bathing Assistance: Limited assistance Feeding assistance: Independent Dressing Assistance: Limited assistance     Functional Limitations Info  Sight, Hearing, Speech Sight Info: Adequate Hearing Info: Adequate Speech Info: Adequate    SPECIAL CARE FACTORS FREQUENCY  OT (By licensed OT), PT (By licensed PT)     PT Frequency: 5 OT Frequency: 5            Contractures      Additional Factors Info  Code Status, Allergies Code Status Info: Full Code. Allergies Info: Levaquin Levofloxacin, Penicillins, Trazodone           Current Medications (12/12/2018):  This is the current  hospital active medication list Current Facility-Administered Medications  Medication Dose Route Frequency Provider Last Rate Last Dose  .  stroke: mapping our early stages of recovery book   Does not apply Once Mansy, Jan A, MD      . 0.9 %  sodium chloride infusion   Intravenous Continuous Mansy, Jan A, MD 100 mL/hr at 12/12/18 0348    . acetaminophen (TYLENOL) tablet 650 mg  650 mg Oral Q4H PRN Mansy, Jan A, MD       Or  . acetaminophen (TYLENOL) solution 650 mg  650 mg Per Tube Q4H PRN Mansy, Jan A, MD       Or  . acetaminophen (TYLENOL) suppository 650 mg  650 mg Rectal Q4H PRN Mansy, Jan A, MD      . acidophilus (RISAQUAD) capsule   Oral Daily Mansy, Jan A, MD   1 capsule at 12/12/18 1028  . amLODipine (NORVASC) tablet 5 mg  5 mg Oral QPM Mansy, Jan A, MD      . Derrill Memo ON 12/13/2018] aspirin tablet 325 mg  325 mg Oral Daily Epifanio Lesches, MD      . enoxaparin (LOVENOX) injection 40 mg  40 mg Subcutaneous QHS Mansy, Jan A, MD   40 mg at 12/11/18 2057  . hydrochlorothiazide (HYDRODIURIL) tablet 12.5 mg  12.5 mg Oral Daily Mansy, Jan A, MD   12.5 mg at 12/12/18 1029  . levothyroxine (SYNTHROID) tablet 75 mcg  75 mcg Oral QAC breakfast Epifanio Lesches, MD      . lisinopril (ZESTRIL) tablet 40 mg  40 mg Oral QPM Mansy, Jan A, MD      . metoprolol tartrate (LOPRESSOR) tablet 100 mg  100 mg Oral BID Mansy, Jan A, MD   100 mg at 12/12/18 1029  . ondansetron (ZOFRAN) injection 4 mg  4 mg Intravenous Q4H PRN Mansy, Jan A, MD      . pantoprazole (PROTONIX) EC tablet 40 mg  40 mg Oral Daily Mansy, Jan A, MD   40 mg at 12/12/18 1030  . PARoxetine (PAXIL) tablet 10 mg  10 mg Oral QHS Mansy, Jan A, MD      . pravastatin (PRAVACHOL) tablet 10 mg  10 mg Oral QPM Mansy, Jan A, MD      . senna-docusate (Senokot-S) tablet 1 tablet  1 tablet Oral QHS PRN Mansy, Jan A, MD      . sodium chloride flush (NS) 0.9 % injection 3 mL  3 mL Intravenous Once Vanessa Cambria, MD      . traZODone (DESYREL)  tablet 25 mg  25 mg Oral QHS PRN Mansy, Jan A, MD      . zolpidem (AMBIEN) tablet 2.5 mg  2.5 mg Oral QHS PRN Mansy, Arvella Merles, MD         Discharge Medications: Please see discharge summary for a list of discharge medications.  Relevant Imaging Results:  Relevant Lab Results:   Additional Information SSN: [id number]  Jaqlyn Gruenhagen, Veronia Beets, LCSW

## 2018-12-12 NOTE — TOC Initial Note (Signed)
Transition of Care [facility]) - Initial/Assessment Note    Patient Details  Name: Meghan Dougherty MRN: [id number] Date of Birth: March 18, 1941  Transition of Care [facility]) CM/SW Contact:    Arianny Pun, Lenice Llamas Phone Number: [phone]  12/12/2018, 2:09 PM  Clinical Narrative: Clinical Social Worker ([place]) reviewed chart and noted that PT is recommending CIR. CSW met with patient and her son Meghan Dougherty [phone] was at bedside. Patient was alert and oriented X4 and was laying in the bed. CSW introduced self and explained role of CSW department. Per patient she lives alone in [place] and her son lives in [place]. Meghan Dougherty is patient's only child. CSW explained CIR process and that CIR is located at [facility] in [place] on the 4th floor. Patient and son are interested in [place]. Patient reported that she wants to do rehab at [facility] and get back to her baseline. CSW explained that CIR admissions coordinator will review referral and let CSW know if they can accept patient and will have to get approval from [facility]. Patient and son verbalized their understanding. CSW explained SNF as a back up plan to CIR. Per patient and son they do not want SNF. Patient reported that she will go home with home health if she does not get into CIR. Son reported that he can come stay with patient in her home in [place] as needed. CSW provided patient CMS home health list.   Patient and her son's first choice is CIR. CIR admissions coordinator is aware of above and will review referral. CSW will continue to follow and assist as needed.              Expected Discharge Plan: IP Rehab Facility Barriers to Discharge: Continued Medical Work up   Patient Goals and CMS Choice Patient states their goals for this hospitalization and ongoing recovery are:: To go to inpatient rehab. CMS Medicare.gov Compare Post Acute Care list provided to:: Patient Choice offered to / list presented to : Patient  Expected Discharge Plan and  Services Expected Discharge Plan: [place] In-house Referral: Clinical Social Work   Post Acute Care Choice: Home Health, IP Rehab Living arrangements for the past 2 months: [place]                                      Prior Living Arrangements/Services Living arrangements for the past 2 months: Single Family Home Lives with:: Self Patient language and need for interpreter reviewed:: No Do you feel safe going back to the place where you live?: Yes      Need for Family Participation in Patient Care: Yes (Comment) Care giver support system in place?: Yes (comment)   Criminal Activity/Legal Involvement Pertinent to Current Situation/Hospitalization: No - Comment as needed  Activities of Daily Living Home Assistive Devices/Equipment: None ADL Screening (condition at time of admission) Patient's cognitive ability adequate to safely complete daily activities?: Yes Is the patient deaf or have difficulty hearing?: No Does the patient have difficulty seeing, even when wearing glasses/contacts?: No Does the patient have difficulty concentrating, remembering, or making decisions?: No Patient able to express need for assistance with ADLs?: Yes Does the patient have difficulty dressing or bathing?: No Independently performs ADLs?: Yes (appropriate for developmental age) Does the patient have difficulty walking or climbing stairs?: No Weakness of Legs: Right Weakness of Arms/Hands: Right  Permission Sought/Granted Permission sought to  share information with : Chartered certified accountant granted to share information with : Yes, Verbal Permission Granted              Emotional Assessment Appearance:: Appears stated age Attitude/Demeanor/Rapport: Engaged Affect (typically observed): Pleasant, Calm Orientation: : Oriented to Self, Oriented to Place, Oriented to  Time, Oriented to Situation Alcohol / Substance Use: Not Applicable Psych  Involvement: No (comment)  Admission diagnosis:  TIA (transient ischemic attack) [G45.9] Slurred speech [R47.81] Facial asymmetry [Q67.0] Patient Active Problem List   Diagnosis Date Noted  . TIA (transient ischemic attack) 12/10/2018  . Primary cancer of upper inner quadrant of right female breast ([place]) 12/25/2016   PCP:  Dion Body, MD Pharmacy:   [place], [place] - [address] [address] Phone: [phone] Fax: [phone]     Social Determinants of Health (SDOH) Interventions    Readmission Risk Interventions No flowsheet data found.

## 2018-12-13 ENCOUNTER — Inpatient Hospital Stay: Payer: Medicare Other

## 2018-12-13 MED ORDER — STROKE: EARLY STAGES OF RECOVERY BOOK
Freq: Once | Status: AC
  Administered 2018-12-13: 18:00:00

## 2018-12-13 NOTE — Progress Notes (Signed)
Physical Therapy Treatment Patient Details Name: Meghan Dougherty MRN: [id number] DOB: 10/25/1940 Today's Date: 12/13/2018    History of Present Illness Pt is a 78 y.o. female presenting to hospital 12/10/18 with slurred speech starting about 4 pm 9/1 and then had a fall around 2130 when R knee gave out; pt also noted with R facial droop, R sided weakness, expressive aphasia, and dysarthria.  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  PMH includes R breast CA with h/o radiation treatments and partial mastectomy 01/04/2017, DM, htn, and h/o thyroid surgery.    PT Comments    Pt agreeable to PT; denies pain. Min A for bed mobility in/out of bed with increased time and cueing for sequencing throughout. STS with Min A and mild R lateral lean; pt aware, but requires cues to correct. Demonstrates correction with and without use of a mirror with cues. Progressing ambulation distance to 70 ft, but requires cues to pay attention to body fatigue cues despite education prior to ambulation. Gait is of a small stride shuffling nature. Cues for body alignment, stepping clearance and staying within parameters of rolling walker throughout. Pt cued throughout seat approach to bed; however, pt does not complete turn and attempts to sit prematurely requiring Max A to prevent fall, and Mod A to return to full stand. Pt then able to complete approach and stand to sit safely. Pt/son educated on supine exercises with written handout provided. Also educated on fatigue and need for rest versus overdoing as well as music therapy benefits in acute phase of a CVA. Continue PT to progress strength, balance, safety and functional mobility.    Follow Up Recommendations  CIR     Equipment Recommendations  Rolling walker with 5" wheels;3in1 (PT)    Recommendations for Other Services       Precautions / Restrictions Precautions Precautions:  Fall Precaution Comments: NPO Restrictions Weight Bearing Restrictions: No    Mobility  Bed Mobility Overal bed mobility: Needs Assistance Bed Mobility: Supine to Sit;Sit to Supine     Supine to sit: Min assist Sit to supine: Min assist   General bed mobility comments: verbal cues for sequence with good follow through. Min A for trunk to sit and LEs to suping  Transfers Overall transfer level: Needs assistance Equipment used: Rolling walker (2 wheeled) Transfers: Sit to/from Stand Sit to Stand: Min assist         General transfer comment: Hand over hand assist on R. Mild R lateral lean with verbal cues to correct to midline. Demonstrates ability with and without use of mirror.  Ambulation/Gait Ambulation/Gait assistance: Min assist;Max assist Gait Distance (Feet): 70 Feet Assistive device: Rolling walker (2 wheeled);2 person hand held assist Gait Pattern/deviations: Step-through pattern;Decreased stride length;Shuffle;Decreased step length - right Gait velocity: decreased Gait velocity interpretation: <1.31 ft/sec, indicative of household ambulator General Gait Details: Very small stride with tendency to shuffle B feet. Notes difficulty lifting. R foot also has tendency to take a decreased step or drift outside of rw both requiring cues to correct. Pt also needs verbal cues for Energy conservation/attention to body signal of fatigue. Chair approach poor despite education and cueing requiring Max A to correct, as pt does turns part way around then just sits mis judging where to sit. Max A by therapist to hold and help return pt to stand   [employer]  Mobility    Modified Rankin (Stroke Patients Only)       Balance Overall balance assessment: Needs assistance Sitting-balance support: Bilateral upper extremity supported;Feet supported Sitting balance-Leahy Scale: Fair     Standing balance support: Bilateral upper extremity supported Standing  balance-Leahy Scale: Poor                              Cognition Arousal/Alertness: Awake/alert Behavior During Therapy: WFL for tasks assessed/performed Overall Cognitive Status: Within Functional Limits for tasks assessed                                        Exercises General Exercises - Lower Extremity Ankle Circles/Pumps: AROM;Both;10 reps Quad Sets: Strengthening;Both;10 reps Gluteal Sets: Strengthening;Both;10 reps Short Arc Quad: AROM;Both;10 reps Long Arc Quad: Other (comment)(verbally educated) Heel Slides: AROM;Both;10 reps(L with resisted extension) Hip ABduction/ADduction: AAROM;Both;10 reps(A only to hold leg up) Straight Leg Raises: Strengthening;Both;10 reps Other Exercises Other Exercises: Educated pt and son regarding importance of rest and not pushing beyond tolerance and music therapy in acute phase of CVA Other Exercises: Given HEP    General Comments        Pertinent Vitals/Pain Pain Assessment: No/denies pain    Home Living                      Prior Function            PT Goals (current goals can now be found in the care plan section) Progress towards PT goals: Progressing toward goals    Frequency    7X/week      PT Plan Current plan remains appropriate    Co-evaluation              AM-PAC PT "6 Clicks" Mobility   Outcome Measure  Help needed turning from your back to your side while in a flat bed without using bedrails?: A Little Help needed moving from lying on your back to sitting on the side of a flat bed without using bedrails?: A Little Help needed moving to and from a bed to a chair (including a wheelchair)?: A Little Help needed standing up from a chair using your arms (e.g., wheelchair or bedside chair)?: A Little Help needed to walk in hospital room?: A Lot Help needed climbing 3-5 steps with a railing? : A Lot 6 Click Score: 16    End of Session Equipment Utilized During  Treatment: Gait belt Activity Tolerance: Patient tolerated treatment well Patient left: in bed;with call bell/phone within reach;with bed alarm set;with family/visitor present   PT Visit Diagnosis: Other abnormalities of gait and mobility (R26.89);Muscle weakness (generalized) (M62.81);History of falling (Z91.81);Difficulty in walking, not elsewhere classified (R26.2);Hemiplegia and hemiparesis Hemiplegia - Right/Left: Right Hemiplegia - dominant/non-dominant: Dominant Hemiplegia - caused by: Cerebral infarction     Time: [id number] PT Time Calculation (min) (ACUTE ONLY): 38 min  Charges:  $Gait Training: 8-22 mins $Therapeutic Exercise: 8-22 mins $Therapeutic Activity: 8-22 mins                      Larae Grooms, PTA 12/13/2018, 5:03 PM

## 2018-12-14 NOTE — Progress Notes (Signed)
[facility] [place] NAME: Meghan Dougherty    MR#:  [id number]  DATE OF BIRTH:  30-Oct-1940 dysarthria improved, talking better than before.  Denies any complaints. Chief Complaint  Patient presents with  . Code Stroke    REVIEW OF SYSTEMS:   ROS CONSTITUTIONAL: No fever, fatigue or weakness.  EYES: No blurred or double vision.  EARS, NOSE, AND THROAT: No tinnitus or ear pain.  RESPIRATORY: No cough, shortness of breath, wheezing or hemoptysis.  CARDIOVASCULAR: No chest pain, orthopnea, edema.  GASTROINTESTINAL: No nausea, vomiting, diarrhea or abdominal pain.  GENITOURINARY: No dysuria, hematuria.  ENDOCRINE: No polyuria, nocturia,  HEMATOLOGY: No anemia, easy bruising or bleeding SKIN: No rash or lesion. MUSCULOSKELETAL: No joint pain or arthritis.   NEUROLOGIC: No tingling, numbness, weakness.  Patient has difficulty getting the words out and slurred speech noted. PSYCHIATRY: No anxiety or depression.   DRUG ALLERGIES:   Allergies  Allergen Reactions  . Levaquin [Levofloxacin] Rash  . Penicillins Rash    Has patient had a PCN reaction causing immediate rash, facial/tongue/throat swelling, SOB or lightheadedness with hypotension: No Has patient had a PCN reaction causing severe rash involving mucus membranes or skin necrosis: No Has patient had a PCN reaction that required hospitalization: No Has patient had a PCN reaction occurring within the last 10 years: No If all of the above answers are "NO", then may proceed with Cephalosporin use.   . Trazodone Other (See Comments)    Headache and nightmares    VITALS:  Blood pressure 125/72, pulse 69, temperature 97.9 F (36.6 C), temperature source Oral, resp. rate 19, height 5' (1.524 m), weight 74.5 kg, SpO2 92 %.  PHYSICAL EXAMINATION:  GENERAL:  78 y.o.-year-old patient lying in the bed with no acute distress.  Has expressive dysarthria. EYES: Pupils equal, round,  reactive to light . No scleral icterus. Extraocular muscles intact.  HEENT: Head atraumatic, normocephalic. Oropharynx and nasopharynx clear.  NECK:  Supple, no jugular venous distention. No thyroid enlargement, no tenderness.  LUNGS: Patient noted to have some cough.  Breath sounds are clear.    CARDIOVASCULAR: S1, S2 normal. No murmurs, rubs, or gallops.  ABDOMEN: Soft, nontender, nondistended. Bowel sounds present. No organomegaly or mass.  EXTREMITIES: No pedal edema, cyanosis, or clubbing.  NEUROLOGIC: Patient is alert, oriented, Dysarthric, cranial nerves II through XII are intact except for right facial droop, diminished strength in right upper and lower extremity 4 out of 5, left upper and lower 5 out of 5 in strength.  Tone is normal.  Sensations are intact, DTR 1+ symmetric throughout.   PSYCHIATRIC: The patient is alert and oriented x 3.  SKIN: No obvious rash, lesion, or ulcer.    LABORATORY PANEL:   CBC Recent Labs  Lab 12/10/18 0034  WBC 9.5  HGB 13.8  HCT 50.5*  PLT 300   ------------------------------------------------------------------------------------------------------------------  Chemistries  Recent Labs  Lab 12/10/18 0034  NA 132*  K 3.7  CL 96*  CO2 23  GLUCOSE 133*  BUN 15  CREATININE 0.89  CALCIUM 9.3  AST 31  ALT 21  ALKPHOS 76  BILITOT 0.6   ------------------------------------------------------------------------------------------------------------------  Cardiac Enzymes No results for input(s): TROPONINI in the last 168 hours. ------------------------------------------------------------------------------------------------------------------  RADIOLOGY:  US Venous Img Upper Uni Left  Result Date: 12/13/2018 CLINICAL DATA:  Swelling, redness. Possible vascular injury of left arm EXAM: LEFT UPPER EXTREMITY VENOUS DOPPLER ULTRASOUND TECHNIQUE: Gray-scale sonography with graded compression, as well as  color Doppler and duplex ultrasound were  performed to evaluate the upper extremity deep venous system from the level of the subclavian vein and including the jugular, axillary, basilic, radial, ulnar and upper cephalic vein. Spectral Doppler was utilized to evaluate flow at rest and with distal augmentation maneuvers. COMPARISON:  None. FINDINGS: Contralateral Subclavian Vein: Respiratory phasicity is normal and symmetric with the symptomatic side. No evidence of thrombus. Normal compressibility. Internal Jugular Vein: No evidence of thrombus. Normal compressibility, respiratory phasicity and response to augmentation. Subclavian Vein: No evidence of thrombus. Normal compressibility, respiratory phasicity and response to augmentation. Axillary Vein: No evidence of thrombus. Normal compressibility, respiratory phasicity and response to augmentation. Cephalic Vein: No evidence of thrombus. Normal compressibility, respiratory phasicity and response to augmentation. Basilic Vein: No evidence of thrombus. Normal compressibility, respiratory phasicity and response to augmentation. Brachial Veins: No evidence of thrombus. Normal compressibility, respiratory phasicity and response to augmentation. Radial Veins: No evidence of thrombus. Normal compressibility, respiratory phasicity and response to augmentation. Ulnar Veins: No evidence of thrombus. Normal compressibility, respiratory phasicity and response to augmentation. Venous Reflux:  None visualized. Other Findings: Ultrasound in the area of bruising on the left forearm demonstrates no focal fluid collection or other abnormality. IMPRESSION: No evidence of DVT within the left upper extremity. No evidence of vascular injury. Electronically Signed   By: Rolm Baptise M.D.   On: 12/13/2018 21:33    EKG:   Orders placed or performed during the hospital encounter of 12/10/18  . ED EKG  . ED EKG  . EKG 12-Lead  . 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 She has a mod+ pharyngeal swallow initation delay and needs to be on Honey consistency liquids for their slowness (giving her time to close the airway when swallowing). 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 and the material comes back up to the pyriform sinuses and can increase RISK for Aspiration  Continue dysphagia 2 diet.  2.  Physical therapy recommends inpatient rehab.  #3 .diabetes mellitus type 2: Continue sliding scale insulin with coverage. 4.  Hypothyroidism: Continue Synthyroid   #5 .appreciate speech therapy following the patient, All the records are reviewed and case discussed with Care Management/Social Workerr. Management plans discussed with the patient, family and they are in agreement.  CODE STATUS: Full code  TOTAL TIME TAKING CARE OF THIS PATIENT: 38 minutes.   Discharge to CIR when the bed is available.. Patient is medically ready today  Epifanio Lesches M.D on 12/14/2018 at 3:01 PM  Between 7am to 6pm - Pager - [phone]  After 6pm go to [email or web address] - password [email or web address] [place] [facility]  Office  [phone]  CC: Primary care physician; Dion Body, MD   Note: This dictation was prepared with Dragon dictation along with smaller phrase technology. Any transcriptional errors that result from this process are unintentional.

## 2018-12-14 NOTE — Progress Notes (Signed)
Physical Therapy Treatment Patient Details Name: Meghan Dougherty MRN: [id number] DOB: [birth year] Today's Date: 12/14/2018    History of Present Illness Pt is a 78 y.o. female presenting to hospital 12/10/18 with slurred speech starting about 4 pm 9/1 and then had a fall around 2130 when R knee gave out; pt also noted with R facial droop, R sided weakness, expressive aphasia, and dysarthria.  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  PMH includes R breast CA with h/o radiation treatments and partial mastectomy 01/04/2017, DM, htn, and h/o thyroid surgery.    PT Comments    Pt ready for session.  To edge of bed with min a x 1 to get hips fully to edge of bed.  Stood and was able to ambulate x 1 100'  around small nursing pod then after seated rest ambulate 50' x 2.  Session focused on hand placements to increase safety, verbal and tactile cues as needed to encourage equal pushing of walker forward as weakness in RUE results in L deviations at times especially as she fatigues.  She continues to have difficulty with approach to sitting surfaces and needs increased cues to fully turn and to back up to the chair.  She has difficulty pulling her R foot fully backwards.  She is aware and will comment on decreased coordination in RLE especially with turns and navigating smaller spaces.  She continues to be highly motivated to participate in sessions and puts good effort into activities.  CIR remains appropriate.   Follow Up Recommendations  CIR     Equipment Recommendations  Rolling walker with 5" wheels;3in1 (PT)    Recommendations for Other Services       Precautions / Restrictions Precautions Precautions: Fall Restrictions Weight Bearing Restrictions: No    Mobility  Bed Mobility Overal bed mobility: Needs Assistance Bed Mobility: Supine to Sit     Supine to sit: Min assist     General bed mobility comments:  assist to get fully to EOB  Transfers Overall transfer level: Needs assistance Equipment used: Rolling walker (2 wheeled) Transfers: Sit to/from Stand           General transfer comment: continues to need occasional verbal cues for hand placements, more so with stand to sit transfers and backing up to chair as she has difficulty pulling right foot back fully.  Ambulation/Gait Ambulation/Gait assistance: Min assist Gait Distance (Feet): 90 Feet Assistive device: Rolling walker (2 wheeled) Gait Pattern/deviations: Step-through pattern;Decreased step length - right;Decreased stance time - right Gait velocity: decreased   General Gait Details: Continues to need +1 assist with gait at all times.  Cues to push evenly with BUE but continues to need assist at times to correct walker deviations and for general safety.   Stairs             Wheelchair Mobility    Modified Rankin (Stroke Patients Only)       Balance Overall balance assessment: Needs assistance Sitting-balance support: Bilateral upper extremity supported;Feet supported Sitting balance-Leahy Scale: Fair Sitting balance - Comments: supervision for safety   Standing balance support: Bilateral upper extremity supported Standing balance-Leahy Scale: Poor Standing balance comment: general assist for safety and balance.                            Cognition Arousal/Alertness: Awake/alert Behavior During Therapy: WFL for tasks assessed/performed Overall Cognitive Status:  Within Functional Limits for tasks assessed                                        Exercises Other Exercises Other Exercises: hand placements and safety with gait.    General Comments        Pertinent Vitals/Pain Pain Assessment: No/denies pain    Home Living                      Prior Function            PT Goals (current goals can now be found in the care plan section) Progress towards PT  goals: Progressing toward goals    Frequency    7X/week      PT Plan      Co-evaluation              AM-PAC PT "6 Clicks" Mobility   Outcome Measure  Help needed turning from your back to your side while in a flat bed without using bedrails?: A Little Help needed moving from lying on your back to sitting on the side of a flat bed without using bedrails?: A Little Help needed moving to and from a bed to a chair (including a wheelchair)?: A Little Help needed standing up from a chair using your arms (e.g., wheelchair or bedside chair)?: A Little Help needed to walk in hospital room?: A Little Help needed climbing 3-5 steps with a railing? : A Lot 6 Click Score: 17    End of Session Equipment Utilized During Treatment: Gait belt Activity Tolerance: Patient tolerated treatment well Patient left: with call bell/phone within reach;in chair;with chair alarm set Nurse Communication: Mobility status Hemiplegia - Right/Left: Right Hemiplegia - dominant/non-dominant: Dominant     Time: [id number] PT Time Calculation (min) (ACUTE ONLY): 27 min  Charges:  $Gait Training: 8-22 mins $Therapeutic Activity: 8-22 mins                     Chesley Noon, PTA 12/14/18, 10:32 AM

## 2018-12-14 NOTE — Progress Notes (Signed)
[facility] NAME: Meghan Dougherty    MR#:  [id number]  DATE OF BIRTH:  01-19-1941 Patient dysarthria improved, able to talk little better today.  Status post MBS showing high risk for aspiration, started on dysphagia 2 diet with honey thick liquids, patient is still appropriate for inpatient rehab,  Chief Complaint  Patient presents with  . Code Stroke    REVIEW OF SYSTEMS:   ROS CONSTITUTIONAL: No fever, fatigue or weakness.  EYES: No blurred or double vision.  EARS, NOSE, AND THROAT: No tinnitus or ear pain.  RESPIRATORY: No cough, shortness of breath, wheezing or hemoptysis.  CARDIOVASCULAR: No chest pain, orthopnea, edema.  GASTROINTESTINAL: No nausea, vomiting, diarrhea or abdominal pain.  GENITOURINARY: No dysuria, hematuria.  ENDOCRINE: No polyuria, nocturia,  HEMATOLOGY: No anemia, easy bruising or bleeding SKIN: No rash or lesion. MUSCULOSKELETAL: No joint pain or arthritis.   NEUROLOGIC: No tingling, numbness, weakness.  Patient has difficulty getting the words out and slurred speech noted. PSYCHIATRY: No anxiety or depression.   DRUG ALLERGIES:   Allergies  Allergen Reactions  . Levaquin [Levofloxacin] Rash  . Penicillins Rash    Has patient had a PCN reaction causing immediate rash, facial/tongue/throat swelling, SOB or lightheadedness with hypotension: No Has patient had a PCN reaction causing severe rash involving mucus membranes or skin necrosis: No Has patient had a PCN reaction that required hospitalization: No Has patient had a PCN reaction occurring within the last 10 years: No If all of the above answers are "NO", then may proceed with Cephalosporin use.   . Trazodone Other (See Comments)    Headache and nightmares    VITALS:  Blood pressure 125/72, pulse 69, temperature 97.9 F (36.6 C), temperature source Oral, resp. rate 19, height 5' (1.524 m), weight 74.5 kg, SpO2 92 %.  PHYSICAL  EXAMINATION:  GENERAL:  78 y.o.-year-old patient lying in the bed with no acute distress.  Has expressive dysarthria. EYES: Pupils equal, round, reactive to light . No scleral icterus. Extraocular muscles intact.  HEENT: Head atraumatic, normocephalic. Oropharynx and nasopharynx clear.  NECK:  Supple, no jugular venous distention. No thyroid enlargement, no tenderness.  LUNGS: Patient noted to have some cough.  Breath sounds are clear.    CARDIOVASCULAR: S1, S2 normal. No murmurs, rubs, or gallops.  ABDOMEN: Soft, nontender, nondistended. Bowel sounds present. No organomegaly or mass.  EXTREMITIES: No pedal edema, cyanosis, or clubbing.  NEUROLOGIC: Patient is alert, oriented, Dysarthric, cranial nerves II through XII are intact except for right facial droop, diminished strength in right upper and lower extremity 4 out of 5, left upper and lower 5 out of 5 in strength.  Tone is normal.  Sensations are intact, DTR 1+ symmetric throughout.   PSYCHIATRIC: The patient is alert and oriented x 3.  SKIN: No obvious rash, lesion, or ulcer.    LABORATORY PANEL:   CBC Recent Labs  Lab 12/10/18 0034  WBC 9.5  HGB 13.8  HCT 50.5*  PLT 300   ------------------------------------------------------------------------------------------------------------------  Chemistries  Recent Labs  Lab 12/10/18 0034  NA 132*  K 3.7  CL 96*  CO2 23  GLUCOSE 133*  BUN 15  CREATININE 0.89  CALCIUM 9.3  AST 31  ALT 21  ALKPHOS 76  BILITOT 0.6   ------------------------------------------------------------------------------------------------------------------  Cardiac Enzymes No results for input(s): TROPONINI in the last 168 hours. ------------------------------------------------------------------------------------------------------------------  RADIOLOGY:  US Venous Img Upper Uni Left  Result Date: 12/13/2018 CLINICAL DATA:  Swelling, redness. Possible vascular injury of left arm EXAM: LEFT UPPER  EXTREMITY VENOUS DOPPLER ULTRASOUND TECHNIQUE: Gray-scale sonography with graded compression, as well as color Doppler and duplex ultrasound were performed to evaluate the upper extremity deep venous system from the level of the subclavian vein and including the jugular, axillary, basilic, radial, ulnar and upper cephalic vein. Spectral Doppler was utilized to evaluate flow at rest and with distal augmentation maneuvers. COMPARISON:  None. FINDINGS: Contralateral Subclavian Vein: Respiratory phasicity is normal and symmetric with the symptomatic side. No evidence of thrombus. Normal compressibility. Internal Jugular Vein: No evidence of thrombus. Normal compressibility, respiratory phasicity and response to augmentation. Subclavian Vein: No evidence of thrombus. Normal compressibility, respiratory phasicity and response to augmentation. Axillary Vein: No evidence of thrombus. Normal compressibility, respiratory phasicity and response to augmentation. Cephalic Vein: No evidence of thrombus. Normal compressibility, respiratory phasicity and response to augmentation. Basilic Vein: No evidence of thrombus. Normal compressibility, respiratory phasicity and response to augmentation. Brachial Veins: No evidence of thrombus. Normal compressibility, respiratory phasicity and response to augmentation. Radial Veins: No evidence of thrombus. Normal compressibility, respiratory phasicity and response to augmentation. Ulnar Veins: No evidence of thrombus. Normal compressibility, respiratory phasicity and response to augmentation. Venous Reflux:  None visualized. Other Findings: Ultrasound in the area of bruising on the left forearm demonstrates no focal fluid collection or other abnormality. IMPRESSION: No evidence of DVT within the left upper extremity. No evidence of vascular injury. Electronically Signed   By: Rolm Baptise M.D.   On: 12/13/2018 21:33    EKG:   Orders placed or performed during the hospital encounter of  12/10/18  . ED EKG  . ED EKG  . EKG 12-Lead  . 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 She has a mod+ pharyngeal swallow initation delay and needs to be on Honey consistency liquids for their slowness (giving her time to close the airway when swallowing). 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 and the material comes back up to the pyriform sinuses and can increase RISK for Aspiration  Continue dysphagia 2 diet.  2.  Physical therapy recommends inpatient rehab,consult placed\.  #3 .diabetes mellitus type 2: Continue sliding scale insulin with coverage. 4.  Hypothyroidism: Continue Synthyroid orally when able to take food by mouth.   #5 .appreciate speech therapy following the patient, All the records are reviewed and case discussed with Care Management/Social Workerr. Management plans discussed with the patient, family and they are in agreement.  CODE STATUS: Full code  TOTAL TIME TAKING CARE OF THIS PATIENT: 38 minutes.   Discharge to CIR when the bed is available.. Patient is medically ready today  Epifanio Lesches M.D on 12/14/2018 at 3:02 PM  Between 7am to 6pm - Pager - [phone]  After 6pm go to [email or web address] - password [email or web address] [facility]  Office  [phone]  CC: Primary care physician; Dion Body, MD   Note: This dictation was prepared with Dragon dictation along with smaller phrase technology. Any transcriptional errors that result from this process are unintentional.

## 2018-12-15 NOTE — TOC Progression Note (Signed)
Transition of Care [facility]) - Progression Note    Patient Details  Name: Kailyn Boaz MRN: [id number] Date of Birth: 01-05-1941  Transition of [facility]) CM/SW Contact  Easter [place], Lenice Llamas Phone Number: [phone]  12/15/2018, 11:53 AM  Clinical Narrative: Clinical Social Worker (CSW) contacted [facility] inpatient rehab to follow up on referral. Per [facility] Med admissions coordinator Marylou Mccoy they can accept patient and will start [facility] authorization tomorrow because [facility] is closed today due to Labor Day Holiday. Per Marylou Mccoy they will need a negative covid test within 48 hours of admission. Patient's most recent negative covid test was 9/2. CSW contacted patient's son [name] and made him aware of above. CSW will continue to follow and assist as needed.      Expected Discharge Plan: IP Rehab Facility Barriers to Discharge: Continued Medical Work up  Expected Discharge Plan and Services Expected Discharge Plan: [place] In-house Referral: Clinical Social Work   Post Acute Care Choice: Home Health, IP Rehab Living arrangements for the past 2 months: Single Family Home                                       Social Determinants of Health (SDOH) Interventions    Readmission Risk Interventions No flowsheet data found.

## 2018-12-15 NOTE — Progress Notes (Signed)
Inpatient Rehabilitation Admissions Coordinator  We will sign off at this time as [facility] to pursue admit with insurance for son prefers the [place] area.  Danne Baxter, RN, MSN Rehab Admissions Coordinator [phone] 12/15/2018 12:06 PM

## 2018-12-15 NOTE — Progress Notes (Addendum)
Inpatient Rehabilitation Admissions Coordinator  I contacted  [facility] [place], [place] by phone.  We await [facility] decision on AIR admit, as [facility] is pt's son's preference for venue as he lives in the [place] area.  Danne Baxter, RN, MSN Rehab Admissions Coordinator [phone] 12/15/2018 10:17 AM '

## 2018-12-15 NOTE — Care Management Important Message (Signed)
Important Message  Patient Details  Name: Quinton Overbeck MRN: [id number] Date of Birth: 02-Jan-1941   Medicare Important Message Given:  Yes     Juliann Pulse A Tatijana Bierly 12/15/2018, 11:51 AM

## 2018-12-15 NOTE — Progress Notes (Signed)
Speech Language Pathology Treatment: Dysphagia  Patient Details Name: Meghan Dougherty MRN: [id number] DOB: 1940-08-17 Today's Date: 12/15/2018 Time: 1255-1400 SLP Time Calculation (min) (ACUTE ONLY): 65 min  Assessment / Plan / Recommendation Clinical Impression  Pt seen for ongoing assessment of toleration of oral diet; food/liquid consistencies tolerated following precautions as per MBSS. Son present in room during session today. Pt awake and initiating Lunch meal. Pt's Dysarthria appeared min improved; more of a motor speech/fatigue component noted during this session. Pt also endorsed fatigue as the day continues - "I am better at 9am than at 1pm"(discussed reasons behind this particularly post stroke). Briefly discussed components of support when eating meals including positioning upright and pillow support under each arm as pt stated she tends to "lean"; R arm weakness + and is "heavy" per pt report. Also discussed the swallowing strategies as recommended during the MBSS to include lingual sweeping and repeat, Dry swallow w/ each bite and sip. Pt then fed self trials of the Minced foods and Honey consistency liquids using her Left hand. She followed the general aspiration precautions posted including using small, single bites and sips. She utilized compensatory strategies including the Dry swallow. No immediate, overt s/s of aspiration were noted, however, pt did exhibit a strong cough post trials when it appeared she had finished the swallow. Discussed the possibility of increased saliva AND the Retrograde activity of bolus material from the Esophagus back into the Pharynx possibly penetrating the upper airway - again, the importance of using f/u, Dry swallows. Oral phase was min slow and deliberate in OM movements during bolus manipulation and management; full oral clearing achieved post swallows. No overt decline in Pulmonary status or vocal quality noted during/post intake.  Much of the  session spent in education also w/ both pt and Son answering questions; reviewing the MBSS results; educating on/practicing aspiration precautions; education on pt's dysphagia; oral care; swallowing exs and OMEs. Education given on food options and preparation; foods she enjoys but in more of Minced form; dietary supplements as ordered/guided by the Dietician consulted. Recommend continue w/ current Dysphagia level 2 diet w/ HONEY consistency liquids via Cup w/ precautions as discussed above. Recommend f/u at [facility] for Dysphagia, Motor Speech deficits and f/u w/ repeat MBSS when appropriate. Recommend Dietician f/u for support.    HPI HPI: Pt is a 78 y.o. female with Diabetes, prior R breast cancer w/ XRT, hypertension, hyperlipidemia who presents with slurred speech and R sided weakness.  Patient says she developed slurred speech around 4 PM on 12/09/2018 while she was talking to her friend on the phone.  She then had a fall when her right knee gave out.  She denies any pain currently.  She continued to present w/ slurred speech and difficulty with thinking of the words at the ED but stated it was improved from prior.  Also noted is continued R sided weakness.  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  MBSS completed on 12/12/2018.      SLP Plan  Continue with current plan of care       Recommendations  Diet recommendations: Dysphagia 2 (fine chop);Honey-thick liquid Liquids provided via: Teaspoon;Cup Medication Administration: Whole meds with puree(vs Broken down in Puree) Supervision: Patient able to self feed;Staff to assist with self feeding;Full supervision/cueing for compensatory strategies Compensations: Minimize environmental distractions;Slow rate;Small sips/bites;Lingual sweep for clearance of pocketing;Multiple dry swallows after each bite/sip;Follow solids with liquid Postural Changes  and/or Swallow  Maneuvers: Seated upright 90 degrees;Upright 30-60 min after meal(Reflux precautions)                General recommendations: (Dietician f/u also; GI consult/management) Oral Care Recommendations: Staff/trained caregiver to provide oral care;Patient independent with oral care;Oral care BID;Oral care before and after PO Follow up Recommendations: Inpatient Rehab SLP Visit Diagnosis: Dysphagia, oropharyngeal phase (R13.12);Dysphagia, pharyngoesophageal phase (R13.14) Plan: Continue with current plan of care       [place], [place], CCC-SLP Dougherty,Meghan 12/15/2018, 2:08 PM

## 2018-12-15 NOTE — Progress Notes (Signed)
Physical Therapy Treatment Patient Details Name: Meghan Dougherty MRN: [id number] DOB: 11-15-1940 Today's Date: 12/15/2018    History of Present Illness Pt is a 78 y.o. female presenting to hospital 12/10/18 with slurred speech starting about 4 pm 9/1 and then had a fall around 2130 when R knee gave out; pt also noted with R facial droop, R sided weakness, expressive aphasia, and dysarthria.  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  PMH includes R breast CA with h/o radiation treatments and partial mastectomy 01/04/2017, DM, htn, and h/o thyroid surgery.    PT Comments    Pt able to progress to ambulating 100 feet with youth sized RW min assist.  Pt noted with R LE advancing with foot flat initially and with increased distance pt's R LE advancing initiating contact with forefoot/toes; also decreased cadence noted with increased distance ambulating.  Practiced marching in place and also initiating heel-strike with R LE (with R LE advancement) in standing.  During standing activities pt noted to move posterior onto her heels intermittently requiring assist to correct (pt also holding onto bed-rail with L UE for balance).  Will continue to focus on strengthening, balance, and progressive functional mobility during hospital stay.    Follow Up Recommendations  CIR     Equipment Recommendations  Rolling walker with 5" wheels;3in1 (PT)(youth sized)    Recommendations for Other Services OT consult     Precautions / Restrictions Precautions Precautions: Fall Restrictions Weight Bearing Restrictions: No    Mobility  Bed Mobility Overal bed mobility: Needs Assistance Bed Mobility: Supine to Sit;Sit to Supine     Supine to sit: Min assist;HOB elevated Sit to supine: Mod assist   General bed mobility comments: extra time and effort for pt to perform semi-supine to sit with min assist for trunk;  mod assist for  trunk and B LE's sit to semi-supine; vc's for technique  Transfers Overall transfer level: Needs assistance Equipment used: Rolling walker (2 wheeled) Transfers: Sit to/from Stand Sit to Stand: Min assist         General transfer comment: x5 reps from bed; mild increased effort to stand; pt noted with weight through B heels and pushing B LE's against bed in order to stabilize during standing; vc's for lining up to surface prior to sitting  Ambulation/Gait Ambulation/Gait assistance: Min assist Gait Distance (Feet): 100 Feet Assistive device: Rolling walker (2 wheeled)(youth sized) Gait Pattern/deviations: Step-to pattern;Decreased step length - left;Decreased step length - right Gait velocity: decreased (especially with increased distance ambulating)   General Gait Details: pt noted with foot flat when advancing R LE initially and decreased B LE step length but with increased distance pt noted with even more decreased B LE step length and when advancing R LE pt touching forefoot/toes first (pt had difficulty correcting to heelstrike when given vc's); pt tending to be on R side of walker but aware of this and trying to correct   Stairs             Wheelchair Mobility    Modified Rankin (Stroke Patients Only)       Balance Overall balance assessment: Needs assistance Sitting-balance support: No upper extremity supported;Feet supported Sitting balance-Leahy Scale: Good Sitting balance - Comments: SBA for safety; mild R sided lean noted Postural control: Right lateral lean Standing balance support: Single extremity supported Standing balance-Leahy Scale: Poor Standing balance comment: pt requiring at least single UE support for static  standing balance; tending to lean backwards on heals with standing activities requiring min assist to correct (and L UE on bed rail for support)                            Cognition Arousal/Alertness: Awake/alert Behavior During  Therapy: WFL for tasks assessed/performed Overall Cognitive Status: Within Functional Limits for tasks assessed                                        Exercises Total Joint Exercises Marching in Standing: AROM;Strengthening;Both;Standing(10 reps x2 B LE's; L UE support on bed rail; vc's to improve quality with R LE) Other Exercises Other Exercises: Standing (L UE support on bedrail; R UE hand held assist) x10 reps stepping forward practicing heelstrike with R LE and then stepping back 10 reps x2 R LE; vc's for technique; difficulty with R LE coordination    General Comments   Nursing cleared pt for participation in physical therapy.  Pt agreeable to PT session.      Pertinent Vitals/Pain Pain Assessment: No/denies pain Pain Intervention(s): Limited activity within patient's tolerance;Monitored during session;Repositioned  Vitals (HR and O2 on room air) stable and WFL throughout treatment session.    Home Living                      Prior Function            PT Goals (current goals can now be found in the care plan section) Acute Rehab PT Goals Patient Stated Goal: to improve balance and mobility PT Goal Formulation: With patient Time For Goal Achievement: 12/24/18 Potential to Achieve Goals: Good Progress towards PT goals: Progressing toward goals    Frequency    7X/week      PT Plan Current plan remains appropriate    Co-evaluation              AM-PAC PT "6 Clicks" Mobility   Outcome Measure  Help needed turning from your back to your side while in a flat bed without using bedrails?: A Little Help needed moving from lying on your back to sitting on the side of a flat bed without using bedrails?: A Little Help needed moving to and from a bed to a chair (including a wheelchair)?: A Little Help needed standing up from a chair using your arms (e.g., wheelchair or bedside chair)?: A Little Help needed to walk in hospital room?: A  Little Help needed climbing 3-5 steps with a railing? : A Lot 6 Click Score: 17    End of Session Equipment Utilized During Treatment: Gait belt Activity Tolerance: Patient tolerated treatment well Patient left: in bed;with call bell/phone within reach;with bed alarm set Nurse Communication: Mobility status;Precautions;Other (comment)(Pt requesting purewick be replaced) PT Visit Diagnosis: Other abnormalities of gait and mobility (R26.89);Muscle weakness (generalized) (M62.81);History of falling (Z91.81);Difficulty in walking, not elsewhere classified (R26.2);Hemiplegia and hemiparesis Hemiplegia - Right/Left: Right Hemiplegia - dominant/non-dominant: Dominant Hemiplegia - caused by: Cerebral infarction     Time: [id number] PT Time Calculation (min) (ACUTE ONLY): 26 min  Charges:  $Gait Training: 8-22 mins $Therapeutic Exercise: 8-22 mins                    Leitha Bleak, PT 12/15/18, 10:28 AM [phone]

## 2018-12-15 NOTE — Progress Notes (Signed)
[facility] [place] NAME: Meghan Dougherty    MR#:  [id number]  DATE OF BIRTH:  [birth year]      Denies any complaints.  Patient speech is getting better. Chief Complaint  Patient presents with  . Code Stroke    REVIEW OF SYSTEMS:   ROS CONSTITUTIONAL: No fever, fatigue or weakness.  EYES: No blurred or double vision.  EARS, NOSE, AND THROAT: No tinnitus or ear pain.  RESPIRATORY: No cough, shortness of breath, wheezing or hemoptysis.  CARDIOVASCULAR: No chest pain, orthopnea, edema.  GASTROINTESTINAL: No nausea, vomiting, diarrhea or abdominal pain.  GENITOURINARY: No dysuria, hematuria.  ENDOCRINE: No polyuria, nocturia,  HEMATOLOGY: No anemia, easy bruising or bleeding SKIN: No rash or lesion. MUSCULOSKELETAL: No joint pain or arthritis.   NEUROLOGIC: No tingling, numbness, weakness.  Patient has difficulty getting the words out and slurred speech noted. PSYCHIATRY: No anxiety or depression.   DRUG ALLERGIES:   Allergies  Allergen Reactions  . Levaquin [Levofloxacin] Rash  . Penicillins Rash    Has patient had a PCN reaction causing immediate rash, facial/tongue/throat swelling, SOB or lightheadedness with hypotension: No Has patient had a PCN reaction causing severe rash involving mucus membranes or skin necrosis: No Has patient had a PCN reaction that required hospitalization: No Has patient had a PCN reaction occurring within the last 10 years: No If all of the above answers are "NO", then may proceed with Cephalosporin use.   . Trazodone Other (See Comments)    Headache and nightmares    VITALS:  Blood pressure (!) 146/63, pulse 64, temperature 97.8 F (36.6 C), temperature source Oral, resp. rate 18, height 5' (1.524 m), weight 74.5 kg, SpO2 94 %.  PHYSICAL EXAMINATION:  GENERAL:  78 y.o.-year-old patient lying in the bed with no acute distress.  Has expressive dysarthria. EYES: Pupils equal, round, reactive  to light . No scleral icterus. Extraocular muscles intact.  HEENT: Head atraumatic, normocephalic. Oropharynx and nasopharynx clear.  NECK:  Supple, no jugular venous distention. No thyroid enlargement, no tenderness.  LUNGS: Patient noted to have some cough.  Breath sounds are clear.    CARDIOVASCULAR: S1, S2 normal. No murmurs, rubs, or gallops.  ABDOMEN: Soft, nontender, nondistended. Bowel sounds present. No organomegaly or mass.  EXTREMITIES: No pedal edema, cyanosis, or clubbing.  NEUROLOGIC: Patient is alert, oriented, Dysarthric, cranial nerves II through XII are intact except for right facial droop, diminished strength in right upper and lower extremity 4 out of 5, left upper and lower 5 out of 5 in strength.  Tone is normal.  Sensations are intact, DTR 1+ symmetric throughout.   PSYCHIATRIC: The patient is alert and oriented x 3.  SKIN: No obvious rash, lesion, or ulcer.    LABORATORY PANEL:   CBC Recent Labs  Lab 12/10/18 0034  WBC 9.5  HGB 13.8  HCT 50.5*  PLT 300   ------------------------------------------------------------------------------------------------------------------  Chemistries  Recent Labs  Lab 12/10/18 0034  NA 132*  K 3.7  CL 96*  CO2 23  GLUCOSE 133*  BUN 15  CREATININE 0.89  CALCIUM 9.3  AST 31  ALT 21  ALKPHOS 76  BILITOT 0.6   ------------------------------------------------------------------------------------------------------------------  Cardiac Enzymes No results for input(s): TROPONINI in the last 168 hours. ------------------------------------------------------------------------------------------------------------------  RADIOLOGY:  US Venous Img Upper Uni Left  Result Date: 12/13/2018 CLINICAL DATA:  Swelling, redness. Possible vascular injury of left arm EXAM: LEFT UPPER EXTREMITY VENOUS DOPPLER ULTRASOUND TECHNIQUE: Gray-scale sonography with  graded compression, as well as color Doppler and duplex ultrasound were performed  to evaluate the upper extremity deep venous system from the level of the subclavian vein and including the jugular, axillary, basilic, radial, ulnar and upper cephalic vein. Spectral Doppler was utilized to evaluate flow at rest and with distal augmentation maneuvers. COMPARISON:  None. FINDINGS: Contralateral Subclavian Vein: Respiratory phasicity is normal and symmetric with the symptomatic side. No evidence of thrombus. Normal compressibility. Internal Jugular Vein: No evidence of thrombus. Normal compressibility, respiratory phasicity and response to augmentation. Subclavian Vein: No evidence of thrombus. Normal compressibility, respiratory phasicity and response to augmentation. Axillary Vein: No evidence of thrombus. Normal compressibility, respiratory phasicity and response to augmentation. Cephalic Vein: No evidence of thrombus. Normal compressibility, respiratory phasicity and response to augmentation. Basilic Vein: No evidence of thrombus. Normal compressibility, respiratory phasicity and response to augmentation. Brachial Veins: No evidence of thrombus. Normal compressibility, respiratory phasicity and response to augmentation. Radial Veins: No evidence of thrombus. Normal compressibility, respiratory phasicity and response to augmentation. Ulnar Veins: No evidence of thrombus. Normal compressibility, respiratory phasicity and response to augmentation. Venous Reflux:  None visualized. Other Findings: Ultrasound in the area of bruising on the left forearm demonstrates no focal fluid collection or other abnormality. IMPRESSION: No evidence of DVT within the left upper extremity. No evidence of vascular injury. Electronically Signed   By: Rolm Baptise M.D.   On: 12/13/2018 21:33    EKG:   Orders placed or performed during the hospital encounter of 12/10/18  . ED EKG  . ED EKG  . EKG 12-Lead  . 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 daily.  MBSS. She has a mod+ pharyngeal swallow initation delay and needs to be on Honey consistency liquids for their slowness (giving her time to close the airway when swallowing). 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 and the material comes back up to the pyriform sinuses and can increase RISK for Aspiration  Continue dysphagia 2 diet.  2.  Physical therapy recommends inpatient rehab.  #3 .diabetes mellitus type 2: Continue sliding scale insulin with coverage. 4.  Hypothyroidism: Continue Synthyroid   #5 .appreciate speech therapy following the patient,  6 left upper extremity swelling secondary to IV access, DVT study negative; All the records are reviewed and case discussed with Care Management/Social Workerr. Management plans discussed with the patient, family and they are in agreement.  CODE STATUS: Full code  TOTAL TIME TAKING CARE OF THIS PATIENT: 38 minutes.   Discharge to CIR when the bed is available.. Patient is medically ready today  Epifanio Lesches M.D on 12/15/2018 at 10:18 AM  Between 7am to 6pm - Pager - [phone]  After 6pm go to [email or web address] - password [email or web address] [facility]  Office  [phone]  CC: Primary care physician; Dion Body, MD   Note: This dictation was prepared with Dragon dictation along with smaller phrase technology. Any transcriptional errors that result from this process are unintentional.

## 2018-12-15 NOTE — Progress Notes (Signed)
Occupational Therapy Treatment Patient Details Name: Meghan Dougherty MRN: [id number] DOB: May 05, 1940 Today's Date: 12/15/2018    History of present illness Pt is a 78 y.o. female presenting to hospital 12/10/18 with slurred speech starting about 4 pm 9/1 and then had a fall around 2130 when R knee gave out; pt also noted with R facial droop, R sided weakness, expressive aphasia, and dysarthria.  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  PMH includes R breast CA with h/o radiation treatments and partial mastectomy 01/04/2017, DM, htn, and h/o thyroid surgery.   OT comments  OT presents for treatment this date while pt finishing up eating breakfast, long sitting in bed with HOB elevated ~70 degrees. Noted to have pureed/thickened items on breakfast tray. Pt states she is basically finished and is agreeable to therapy. States she would like to brush and apply her dentures first. OT flattens HOB and engages pt in sup to sit to EOB with CGA with one cue for pt to utilize bed rail. Pt seated EOB demos G static sitting balance, but once engaged in oral hygiene task, pt can be seen assuming a R or posterior lean requiring intermittent verbal/tactile cues to correct/return to more midline seated position. While pt is R handed at baseline, she utilizes L hand to brush dentures and attempts to stabilize with R hand. Pt experiences difficulty stabilizing d/t decreased R-side fine motor coordination. In addition, at times, pt is noted to not use R hand during fxl task at all requiring verbal cueing to utilize R hand. OT provides MOD A for pt to stabilize with R hand while brushing dentures and applying adhesive. Pt then requesting to attempt to use commode. OT provides MIN A with FWW for sit to stand. Standing balance, commode transfer, and peri care level of assistance details listed below. OT assists pt back to bed and re-engages bed alarm  before notifying her son who was waiting in hallway that he can come in to visit. CIR remains most appropriate d/c disposition based on pt's high PLOF and current need for assitance in almost all aspects of self care and ADL mobility.   Follow Up Recommendations  CIR    Equipment Recommendations  3 in 1 bedside commode    Recommendations for Other Services      Precautions / Restrictions Precautions Precautions: Fall Precaution Comments: pt on thickened diet Restrictions Weight Bearing Restrictions: No       Mobility Bed Mobility Overal bed mobility: Needs Assistance Bed Mobility: Supine to Sit;Sit to Supine     Supine to sit: Min guard Sit to supine: Min guard   General bed mobility comments: HOB flattened, but heavy use of bed rails  Transfers Overall transfer level: Needs assistance Equipment used: Rolling walker (2 wheeled) Transfers: Sit to/from Stand Sit to Stand: Min assist         General transfer comment: with one verbal cue for safe hand placement    Balance Overall balance assessment: Needs assistance Sitting-balance support: No upper extremity supported;Feet supported Sitting balance-Leahy Scale: Good Sitting balance - Comments: mild R or posterior lean during more fxl seated task participation, but G static sitting balance Postural control: Posterior lean;Right lateral lean Standing balance support: Bilateral upper extremity supported Standing balance-Leahy Scale: Fair Standing balance comment: use of FWW for standing balance, F static balance with CGA-MIN A, P for more dynamic movement requiring MIN-MOD A, weight noted to be mostly in heels  ADL either performed or assessed with clinical judgement   ADL Overall ADL's : Needs assistance/impaired Eating/Feeding: Set up;Sitting;Minimal assistance Eating/Feeding Details (indicate cue type and reason): pt on thickened diet, requires assitance opening  container/condiment packets Grooming: Set up;Moderate assistance;Sitting Grooming Details (indicate cue type and reason): Pt requires MOD A for brshing dentures and applying polygrip. IF OT holds still, pt can brush using L hand. OT attempts to engage pt with R hand use for stabilization (pt R handed at baseline), but pt still requires OT assistance to rotate dentures with R hand while brushing with L.                 Toilet Transfer: Minimal assistance;BSC;RW   Toileting- Clothing Manipulation and Hygiene: Minimal assistance;Sit to/from stand Toileting - Clothing Manipulation Details (indicate cue type and reason): requires increased time in standing with FWW and MIN A to maneuver hospital gown to prep for sitting on commode, requires MIN A for thorough peri care in sitting using lateral lean tehcnique.     Functional mobility during ADLs: Minimal assistance;Rolling walker;Moderate assistance(to/from BSC ~16ft away from EOB)       Vision Baseline Vision/History: Wears glasses Wears Glasses: Reading only Patient Visual Report: No change from baseline     Perception     Praxis      Cognition Arousal/Alertness: Awake/alert Behavior During Therapy: WFL for tasks assessed/performed Overall Cognitive Status: Within Functional Limits for tasks assessed                                 General Comments: very minimal delayed response to some questions and some minimal word finding, but all answers appropriate if allotted slightly more time        Exercises Other Exercises Other Exercises: safety education with pt including safe hand/foot placement with use of FWW-using visual scanning to ensure that R hand is placed completely on R UE FWW grip Other Exercises: Education with pt re: importance of utilizing R UE as she can be noted to primarily rely on L UE at this time d/t slightly decreased fine motor coordination and manipulation of R hand. Other Exercises: OT  facilitates education re: discharge recommendations-requiring rehab to modify/adapt self care tasks and mobility as necessary as pt was living alone, driving and completely Indep with ADLs/IADLs.   Shoulder Instructions       General Comments      Pertinent Vitals/ Pain       Pain Assessment: No/denies pain Pain Intervention(s): Limited activity within patient's tolerance;Monitored during session;Repositioned  Home Living                                          Prior Functioning/Environment              Frequency  Min 3X/week        Progress Toward Goals  OT Goals(current goals can now be found in the care plan section)  Progress towards OT goals: Progressing toward goals  Acute Rehab OT Goals Patient Stated Goal: to improve balance and mobility OT Goal Formulation: With patient Potential to Achieve Goals: Good  Plan Discharge plan remains appropriate    Co-evaluation                 AM-PAC OT "6 Clicks" Daily Activity     Outcome  Measure   Help from another person eating meals?: A Little Help from another person taking care of personal grooming?: A Lot Help from another person toileting, which includes using toliet, bedpan, or urinal?: A Little Help from another person bathing (including washing, rinsing, drying)?: A Lot Help from another person to put on and taking off regular upper body clothing?: A Lot Help from another person to put on and taking off regular lower body clothing?: A Lot 6 Click Score: 14    End of Session Equipment Utilized During Treatment: Gait belt;Rolling walker  OT Visit Diagnosis: History of falling (Z91.81);Muscle weakness (generalized) (M62.81)   Activity Tolerance Patient tolerated treatment well   Patient Left in bed;with call bell/phone within reach;with bed alarm set;with family/visitor present   Nurse Communication (nurse off floor taking another pt to proceudure, OT infroms PCA that therapy's  d/c recommendation still appropriate.)        Time: [id number] OT Time Calculation (min): 40 min  Charges: OT General Charges $OT Visit: 1 Visit OT Treatments $Self Care/Home Management : 23-37 mins $Therapeutic Activity: 8-22 mins  Gerrianne Scale, MS, OTR/L ascom [phone] or [phone] 12/15/18, 11:15 AM

## 2018-12-15 NOTE — Plan of Care (Signed)

## 2018-12-16 LAB — SARS CORONAVIRUS 2 BY RT PCR (HOSPITAL ORDER, PERFORMED IN ~~LOC~~ HOSPITAL LAB): SARS Coronavirus 2: NEGATIVE

## 2018-12-16 MED ORDER — DOCUSATE SODIUM 100 MG PO CAPS
200.0000 mg | ORAL_CAPSULE | Freq: Every day | ORAL | Status: DC
  Administered 2018-12-16 – 2018-12-19 (×4): 200 mg via ORAL
  Filled 2018-12-16 (×5): qty 2

## 2018-12-16 MED ORDER — ATORVASTATIN CALCIUM 20 MG PO TABS
40.0000 mg | ORAL_TABLET | Freq: Every day | ORAL | Status: DC
  Administered 2018-12-17 – 2018-12-18 (×2): 40 mg via ORAL
  Filled 2018-12-16 (×2): qty 2

## 2018-12-16 NOTE — TOC Progression Note (Addendum)
Transition of Care [facility]) - Progression Note    Patient Details  Name: Aiesha Constantineau MRN: [id number] Date of Birth: Jul 15, 1940  [facility] of [facility]) CM/SW Contact  Shalese Strahan, Lenice Llamas Phone Number: [phone]  12/16/2018, 9:41 AM  Clinical Narrative: Clinical Social Worker ([place]) spoke to Amy [phone] admissions coordinator at [facility] today. Per Amy her co-worker Pryor Montes [phone] has been assigned the case. Per Amy patient is on the waiting list with 6 other patients. Per Amy the waiting list goes fast and Pryor Montes will start [facility] authorization today. CSW left [place] a [employer] account executive. CSW will continue to follow and assist as needed.     1:30 pm: CSW received a call from [facility] admissions coordinator at [facility]. Per Pryor Montes she started [facility] authorization and requested CSW to fax most recent clinicals. CSW faxed requested clinicals to [place].      Expected Discharge Plan: IP Rehab Facility Barriers to Discharge: Continued Medical Work up  Expected Discharge Plan and Services Expected Discharge Plan: [place] In-house Referral: Clinical Social Work   Post Acute Care Choice: Home Health, IP Rehab Living arrangements for the past 2 months: Single Family Home                                       Social Determinants of Health (SDOH) Interventions    Readmission Risk Interventions No flowsheet data found.

## 2018-12-16 NOTE — Progress Notes (Signed)
Physical Therapy Treatment Patient Details Name: Meghan Dougherty MRN: [id number] DOB: 03/28/1941 Today's Date: 12/16/2018    History of Present Illness Pt is a 78 y.o. female presenting to hospital 12/10/18 with slurred speech starting about 4 pm 9/1 and then had a fall around 2130 when R knee gave out; pt also noted with R facial droop, R sided weakness, expressive aphasia, and dysarthria.  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  PMH includes R breast CA with h/o radiation treatments and partial mastectomy 01/04/2017, DM, htn, and h/o thyroid surgery.    PT Comments    Pt able to ambulate 100 feet with RW CGA to min assist for balance but requiring vc's for improving gait technique with increased distance ambulating (see below for details).  Pt demonstrating difficulty with coordinating R LE heel-strike and managing walker at same time.  Focused on quality of R LE movement to improve gait quality and safety during session.  Improvement in transfers noted but pt still requires cueing for technique (to decrease pt's weight-shift onto heels when standing which decreases pt's need to push B LE's against surface to stabilize when standing).  Intermittent word finding difficulty noted.  Will continue to focus on strengthening, balance, and improving gait technique with ambulation.    Follow Up Recommendations  CIR     Equipment Recommendations  Rolling walker with 5" wheels;3in1 (PT)    Recommendations for Other Services OT consult     Precautions / Restrictions Precautions Precautions: Fall Precaution Comments: pt on thickened diet Restrictions Weight Bearing Restrictions: No    Mobility  Bed Mobility Overal bed mobility: Needs Assistance Bed Mobility: Supine to Sit;Sit to Supine     Supine to sit: Min guard Sit to supine: Min guard   General bed mobility comments: increased effort/time to perform  semi-supine to sit on own with HOB elevated; assist for R>L LE sit to semi-supine; increased effort/time and cueing to scoot up in bed on own  Transfers Overall transfer level: Needs assistance Equipment used: Rolling walker (2 wheeled) Transfers: Sit to/from Stand Sit to Stand: Min assist         General transfer comment: pt pushing B LE's against bed initially to stand (min assist for balance) so pt given cues on scooting forward to edge of bed, UE/LE placement, and leaning forward to stand and pt stood x2 trials with CGA; pt requiring min assist to stand from bed x2 trials and from [facility] x1 trial later in session (pt did not remember vc's and again pushing B LE's against surface to stabilize in standing)  Ambulation/Gait Ambulation/Gait assistance: Min guard;Min assist Gait Distance (Feet): 100 Feet Assistive device: Rolling walker (2 wheeled)(youth sized) Gait Pattern/deviations: Step-to pattern;Decreased step length - left;Decreased step length - right Gait velocity: decreased (especially with increased distance ambulating)   General Gait Details: pt initially attempting to perform R LE heelstrike with ambulation with vc's but pt had a lot of difficulty coordinating this and taking any steps so pt cued to walk without heelstrike initially; pt noted with foot flat when advancing R LE initially and decreased B LE step length but with increased distance pt noted with even more decreased B LE step length and when advancing R LE pt touching forefoot/toes first (pt had difficulty correcting to heelstrike when given vc's but able to go back to foot flat instead); pt tending to be on R side of walker but aware of this  and trying to correct   Stairs             Wheelchair Mobility    Modified Rankin (Stroke Patients Only)       Balance Overall balance assessment: Needs assistance Sitting-balance support: No upper extremity supported;Feet supported Sitting balance-Leahy Scale:  Good Sitting balance - Comments: mild R or posterior lean in sitting but overall good static sitting balance Postural control: Posterior lean;Right lateral lean Standing balance support: Single extremity supported Standing balance-Leahy Scale: Fair Standing balance comment: pt tending to lean backwards onto heals at times with activities                            Cognition Arousal/Alertness: Awake/alert Behavior During Therapy: WFL for tasks assessed/performed Overall Cognitive Status: Within Functional Limits for tasks assessed                                 General Comments: intermittent word finding difficulty but did better if given more time to answer      Exercises Total Joint Exercises Marching in Standing: AROM;Strengthening;Right;Standing(10 reps x2 with B UE support on RW; CGA to min assist for balance intermittently) Other Exercises Other Exercises: Standing (B UE support on RW) stepping forward practicing heelstrike with R LE and then stepping back 10 reps x2 R LE; vc's for technique; difficulty with R LE coordination to perform activity (although improved from yesterday)    General Comments   Nursing cleared pt for participation in physical therapy.  Pt agreeable to PT session.      Pertinent Vitals/Pain Pain Assessment: No/denies pain Pain Intervention(s): Limited activity within patient's tolerance;Monitored during session;Repositioned  Vitals (HR and O2 on room air) stable and WFL throughout treatment session.    Home Living                      Prior Function            PT Goals (current goals can now be found in the care plan section) Acute Rehab PT Goals Patient Stated Goal: to improve balance and mobility PT Goal Formulation: With patient Time For Goal Achievement: 12/24/18 Potential to Achieve Goals: Good Progress towards PT goals: Progressing toward goals    Frequency    7X/week      PT Plan Current plan  remains appropriate    Co-evaluation              AM-PAC PT "6 Clicks" Mobility   Outcome Measure  Help needed turning from your back to your side while in a flat bed without using bedrails?: A Little Help needed moving from lying on your back to sitting on the side of a flat bed without using bedrails?: A Little Help needed moving to and from a bed to a chair (including a wheelchair)?: A Little Help needed standing up from a chair using your arms (e.g., wheelchair or bedside chair)?: A Little Help needed to walk in hospital room?: A Little Help needed climbing 3-5 steps with a railing? : A Lot 6 Click Score: 17    End of Session Equipment Utilized During Treatment: Gait belt Activity Tolerance: Patient tolerated treatment well Patient left: in bed;with call bell/phone within reach;with bed alarm set Nurse Communication: Mobility status;Precautions;Other (comment)(pt requesting Purewick be replaced) PT Visit Diagnosis: Other abnormalities of gait and mobility (R26.89);Muscle weakness (generalized) (M62.81);History of  falling (Z91.81);Difficulty in walking, not elsewhere classified (R26.2);Hemiplegia and hemiparesis Hemiplegia - Right/Left: Right Hemiplegia - dominant/non-dominant: Dominant Hemiplegia - caused by: Cerebral infarction     Time: [id number] PT Time Calculation (min) (ACUTE ONLY): 42 min  Charges:  $Gait Training: 8-22 mins $Therapeutic Exercise: 8-22 mins $Therapeutic Activity: 8-22 mins                    Leitha Bleak, PT 12/16/18, 12:04 PM [phone]

## 2018-12-16 NOTE — Progress Notes (Signed)
Occupational Therapy Treatment Patient Details Name: Meghan Dougherty MRN: [id number] DOB: 1940-09-16 Today's Date: 12/16/2018    History of present illness Pt is a 78 y.o. female presenting to hospital 12/10/18 with slurred speech starting about 4 pm 9/1 and then had a fall around 2130 when R knee gave out; pt also noted with R facial droop, R sided weakness, expressive aphasia, and dysarthria.  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  PMH includes R breast CA with h/o radiation treatments and partial mastectomy 01/04/2017, DM, htn, and h/o thyroid surgery.   OT comments  Pt seen for OT tx this date. Pt received in bed initially self feeding using non-dom L hand. With cues and set up of built up foam handle for utensils and education in importance of utilizing dominant RUE for ADL tasks, pt demo'd ability to self feed using RUE and built up handle. Decr coordination and increased time required but pt able to perform. Cues throughout to implement SLP's instructions for sequencing food vs liquids, small sips/bites, swallowing x2 for each mouth full, and decreasing distractions. With repetition, pt demo'd slight improvements in hand to mouth and scooping with utensils. Pt continues to benefit from skilled OT services and continue to recommend high intensity skilled OT services in in-patient rehab upon discharge.    Follow Up Recommendations  CIR    Equipment Recommendations  3 in 1 bedside commode    Recommendations for Other Services      Precautions / Restrictions Precautions Precautions: Fall Precaution Comments: pt on thickened diet Restrictions Weight Bearing Restrictions: No       Mobility Bed Mobility                  Transfers                      Balance                                           ADL either performed or assessed with clinical judgement   ADL  Overall ADL's : Needs assistance/impaired Eating/Feeding: Set up;With adaptive utensils;Cueing for safety;Cueing for sequencing;Sitting Eating/Feeding Details (indicate cue type and reason): Pt initially self feeding using non-dom L hand. With cues and set up of built up foam handle for utensils and education in importance of utilizing dominant RUE for ADL tasks, pt demo'd ability to self feed using RUE and built up handle. Decr coordination and increased time required but pt able to perform. Cues throughout to implement SLP's instructions for sequencing food vs liquids, small sips/bites, swallowing x2 for each mouth full, and decreasing distractions.                                         Vision Baseline Vision/History: Wears glasses Wears Glasses: Reading only Patient Visual Report: No change from baseline     Perception     Praxis      Cognition Arousal/Alertness: Awake/alert Behavior During Therapy: WFL for tasks assessed/performed Overall Cognitive Status: Within Functional Limits for tasks assessed  Exercises     Shoulder Instructions       General Comments      Pertinent Vitals/ Pain       Pain Assessment: No/denies pain  Home Living                                          Prior Functioning/Environment              Frequency  Min 3X/week        Progress Toward Goals  OT Goals(current goals can now be found in the care plan section)  Progress towards OT goals: Progressing toward goals  Acute Rehab OT Goals Patient Stated Goal: to improve balance and mobility OT Goal Formulation: With patient Potential to Achieve Goals: Good  Plan Discharge plan remains appropriate;Frequency remains appropriate    Co-evaluation                 AM-PAC OT "6 Clicks" Daily Activity     Outcome Measure   Help from another person eating meals?: A Little Help from  another person taking care of personal grooming?: A Little Help from another person toileting, which includes using toliet, bedpan, or urinal?: A Little Help from another person bathing (including washing, rinsing, drying)?: A Lot Help from another person to put on and taking off regular upper body clothing?: A Lot Help from another person to put on and taking off regular lower body clothing?: A Lot 6 Click Score: 15    End of Session    OT Visit Diagnosis: History of falling (Z91.81);Muscle weakness (generalized) (M62.81)   Activity Tolerance Patient tolerated treatment well   Patient Left in bed;with call bell/phone within reach;with bed alarm set   Nurse Communication          Time: [phone] OT Time Calculation (min): 38 min  Charges: OT General Charges $OT Visit: 1 Visit OT Treatments $Self Care/Home Management : 38-52 mins  Jeni Salles, MPH, MS, OTR/L ascom [phone] 12/16/18, 10:53 AM

## 2018-12-16 NOTE — Progress Notes (Signed)
[place] [facility] [place] NAME: Meghan Dougherty    MR#:  [id number]  DATE OF BIRTH:  1941/03/25      Denies any complaints.  Patient speech is getting better. Chief Complaint  Patient presents with  . Code Stroke  Patient continued to have some speech difficulties as we was moving of her right upper extremity.  REVIEW OF SYSTEMS:   ROS CONSTITUTIONAL: No fever, fatigue or weakness.  EYES: No blurred or double vision.  EARS, NOSE, AND THROAT: No tinnitus or ear pain.  RESPIRATORY: No cough, shortness of breath, wheezing or hemoptysis.  CARDIOVASCULAR: No chest pain, orthopnea, edema.  GASTROINTESTINAL: No nausea, vomiting, diarrhea or abdominal pain.  GENITOURINARY: No dysuria, hematuria.  ENDOCRINE: No polyuria, nocturia,  HEMATOLOGY: No anemia, easy bruising or bleeding SKIN: No rash or lesion. MUSCULOSKELETAL: No joint pain or arthritis.   NEUROLOGIC: Positive upper extremity weakness and slurred speech  pSYCHIATRY: No anxiety or depression.   DRUG ALLERGIES:   Allergies  Allergen Reactions  . Levaquin [Levofloxacin] Rash  . Penicillins Rash    Has patient had a PCN reaction causing immediate rash, facial/tongue/throat swelling, SOB or lightheadedness with hypotension: No Has patient had a PCN reaction causing severe rash involving mucus membranes or skin necrosis: No Has patient had a PCN reaction that required hospitalization: No Has patient had a PCN reaction occurring within the last 10 years: No If all of the above answers are "NO", then may proceed with Cephalosporin use.   . Trazodone Other (See Comments)    Headache and nightmares    VITALS:  Blood pressure 129/61, pulse 63, temperature 97.7 F (36.5 C), temperature source Oral, resp. rate 18, height 5' (1.524 m), weight 74.5 kg, SpO2 95 %.  PHYSICAL EXAMINATION:  GENERAL:  78 y.o.-year-old patient lying in the bed with no acute distress.  Has expressive  dysarthria. EYES: Pupils equal, round, reactive to light . No scleral icterus. Extraocular muscles intact.  HEENT: Head atraumatic, normocephalic. Oropharynx and nasopharynx clear.  NECK:  Supple, no jugular venous distention. No thyroid enlargement, no tenderness.  LUNGS: Patient noted to have some cough.  Breath sounds are clear.    CARDIOVASCULAR: S1, S2 normal. No murmurs, rubs, or gallops.  ABDOMEN: Soft, nontender, nondistended. Bowel sounds present. No organomegaly or mass.  EXTREMITIES: No pedal edema, cyanosis, or clubbing.  NEUROLOGIC: Patient is alert, oriented, Dysarthric, cranial nerves II through XII are intact except for right facial droop, diminished strength in right upper and lower extremity 4 out of 5, left upper and lower 5 out of 5 in strength.  Tone is normal.  Sensations are intact, DTR 1+ symmetric throughout.   PSYCHIATRIC: The patient is alert and oriented x 3.  SKIN: No obvious rash, lesion, or ulcer.    LABORATORY PANEL:   CBC Recent Labs  Lab 12/10/18 0034  WBC 9.5  HGB 13.8  HCT 50.5*  PLT 300   ------------------------------------------------------------------------------------------------------------------  Chemistries  Recent Labs  Lab 12/10/18 0034  NA 132*  K 3.7  CL 96*  CO2 23  GLUCOSE 133*  BUN 15  CREATININE 0.89  CALCIUM 9.3  AST 31  ALT 21  ALKPHOS 76  BILITOT 0.6   ------------------------------------------------------------------------------------------------------------------  Cardiac Enzymes No results for input(s): TROPONINI in the last 168 hours. ------------------------------------------------------------------------------------------------------------------  RADIOLOGY:  No results found.  EKG:   Orders placed or performed during the hospital encounter of 12/10/18  . ED EKG  . ED EKG  .  EKG 12-Lead  . 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  Essential hypertension continue therapy with HCTZ lisinopril and metoprolol   3 .diabetes mellitus type 2: Not on any sliding scale insulin I will place patient on sliding scale insulin   4.  Hypothyroidism: Continue Synthyroid    5 left upper extremity swelling secondary to IV access, negative for DVT   6.  Hyperlipidemia continue Pravachol   CODE STATUS: Full code  TOTAL TIME TAKING CARE OF THIS PATIENT: 35 minutes.   Discharge to CIR when the bed is available.. Patient is medically ready today  Dustin Flock M.D on 12/16/2018 at 12:43 PM  Between 7am to 6pm - Pager - [phone]  After 6pm go to [email or web address] - password [email or web address] [facility]  Office  [phone]  CC: Primary care physician; Dion Body, MD   Note: This dictation was prepared with Dragon dictation along with smaller phrase technology. Any transcriptional errors that result from this process are unintentional.

## 2018-12-17 MED ORDER — QUETIAPINE FUMARATE 25 MG PO TABS
25.0000 mg | ORAL_TABLET | Freq: Every day | ORAL | Status: DC
  Administered 2018-12-17 – 2018-12-18 (×2): 25 mg via ORAL
  Filled 2018-12-17 (×2): qty 1

## 2018-12-17 NOTE — Progress Notes (Signed)
Physical Therapy Treatment Patient Details Name: Meghan Dougherty MRN: [id number] DOB: [birth year] Today's Date: 12/17/2018    History of Present Illness Pt is a 78 y.o. female presenting to hospital 12/10/18 with slurred speech starting about 4 pm 9/1 and then had a fall around 2130 when R knee gave out; pt also noted with R facial droop, R sided weakness, expressive aphasia, and dysarthria.  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  PMH includes R breast CA with h/o radiation treatments and partial mastectomy 01/04/2017, DM, htn, and h/o thyroid surgery.    PT Comments    Upon PT arrival, pt reporting being tired d/t not sleeping well last night (nurse aware) but agreeable and motivated to participate in PT session.  Pt noted with increased difficulty advancing R LE and increased R lean today with ambulation requiring min assist for balance and increased effort/time to perform ambulation (nurse notified).  Pt able to perform standing ex's with B UE support on RW (AROM hip flexion--decreased AROM height noted with increased reps; and AAROM hip abduction).  Will continue to focus on strengthening and progressive functional mobility.   Follow Up Recommendations  CIR     Equipment Recommendations  Rolling walker with 5" wheels;3in1 (PT)(youth sized)    Recommendations for Other Services OT consult     Precautions / Restrictions Precautions Precautions: Fall Precaution Comments: pt on thickened diet Restrictions Weight Bearing Restrictions: No    Mobility  Bed Mobility Overal bed mobility: Needs Assistance Bed Mobility: Supine to Sit     Supine to sit: Min guard;HOB elevated     General bed mobility comments: increased effort/time to perform semi-supine to sit on own with [facility] elevated  Transfers Overall transfer level: Needs assistance Equipment used: Rolling walker (2 wheeled) Transfers: Sit to/from  Stand Sit to Stand: Min assist;Min guard         General transfer comment: min assist 1st trial standing with pt pushing B LE's against bed to stand but with minimal vc's pt CGA 2nd attempt standing with RW (without pushing B LE's against bed to stand); increased time to step backwards and line up prior to sitting x2 reps with minimal vc's  Ambulation/Gait Ambulation/Gait assistance: Min assist Gait Distance (Feet): 100 Feet Assistive device: Rolling walker (2 wheeled)(youth sized) Gait Pattern/deviations: Step-to pattern;Decreased step length - left;Decreased step length - right Gait velocity: decreased (especially with increased distance ambulating)   General Gait Details: pt with increased difficulty advancing R LE (especially with increased distance ambulating) and with significant decreased B LE step length; pt initially R foot flat first 40 feet but then progressed to touching R forefoot/toes first so pt given vc's for correction and transitioned to R foot flat again and then eventually sliding R foot forward on floor; increased R lean noted with increased distance ambulating   Stairs             Wheelchair Mobility    Modified Rankin (Stroke Patients Only)       Balance Overall balance assessment: Needs assistance Sitting-balance support: No upper extremity supported;Feet supported Sitting balance-Leahy Scale: Good Sitting balance - Comments: mild R or posterior lean in sitting but overall good static sitting balance Postural control: Posterior lean;Right lateral lean Standing balance support: Single extremity supported Standing balance-Leahy Scale: Fair Standing balance comment: pt requiring single UE support for static standing balance  Cognition Arousal/Alertness: Awake/alert Behavior During Therapy: WFL for tasks assessed/performed Overall Cognitive Status: Within Functional Limits for tasks assessed                                  General Comments: intermittent word finding difficulty requiring increased time at times      Exercises General Exercises - Lower Extremity Hip ABduction/ADduction: AAROM;Strengthening;Right;10 reps;Standing(B UE support on RW; paper towel under R foot to improve ease of sliding on floor but still requiring assist for movement) Hip Flexion/Marching: AROM;Strengthening;Right;10 reps;Standing(B UE support on RW; decreased AROM with increased reps)    General Comments  Pt requiring assist to donn underwear sitting edge of bed and then finishing in standing.      Pertinent Vitals/Pain Pain Assessment: No/denies pain Pain Intervention(s): Monitored during session;Repositioned;Limited activity within patient's tolerance  Vitals (HR and O2 on room air) stable and WFL throughout treatment session.    Home Living                      Prior Function            PT Goals (current goals can now be found in the care plan section) Acute Rehab PT Goals Patient Stated Goal: to improve balance and mobility PT Goal Formulation: With patient Time For Goal Achievement: 12/24/18 Potential to Achieve Goals: Good Progress towards PT goals: Progressing toward goals    Frequency    7X/week      PT Plan Current plan remains appropriate    Co-evaluation              AM-PAC PT "6 Clicks" Mobility   Outcome Measure  Help needed turning from your back to your side while in a flat bed without using bedrails?: A Little Help needed moving from lying on your back to sitting on the side of a flat bed without using bedrails?: A Little Help needed moving to and from a bed to a chair (including a wheelchair)?: A Little Help needed standing up from a chair using your arms (e.g., wheelchair or bedside chair)?: A Little Help needed to walk in hospital room?: A Little Help needed climbing 3-5 steps with a railing? : A Lot 6 Click Score: 17    End of Session  Equipment Utilized During Treatment: Gait belt Activity Tolerance: Patient tolerated treatment well Patient left: with call bell/phone within reach;with bed alarm set(sitting on edge of bed with bedside table in front of pt and pt's son sitting next to pt; SLP came to see pt and reported she would assist pt back to bed and set bed alarm before she left) Nurse Communication: Mobility status;Precautions;Other (comment)(pt sitting on edge of bed with pt's son and Purewick removed) PT Visit Diagnosis: Other abnormalities of gait and mobility (R26.89);Muscle weakness (generalized) (M62.81);History of falling (Z91.81);Difficulty in walking, not elsewhere classified (R26.2);Hemiplegia and hemiparesis Hemiplegia - Right/Left: Right Hemiplegia - dominant/non-dominant: Dominant Hemiplegia - caused by: Cerebral infarction     Time: [id number] PT Time Calculation (min) (ACUTE ONLY): 38 min  Charges:  $Gait Training: 8-22 mins $Therapeutic Activity: 23-37 mins                     Leitha Bleak, PT 12/17/18, 4:29 PM [phone]

## 2018-12-17 NOTE — Progress Notes (Signed)
Pt requested fourth side rail be raised

## 2018-12-17 NOTE — Plan of Care (Signed)

## 2018-12-17 NOTE — Care Management Important Message (Signed)
Important Message  Patient Details  Name: Meghan Dougherty MRN: [id number] Date of Birth: 05-01-1940   Medicare Important Message Given:  Yes     [name] 12/17/2018, 11:39 AM

## 2018-12-17 NOTE — Progress Notes (Signed)
[facility] [place] NAME: Meghan Dougherty    MR#:  [id number]  DATE OF BIRTH:  Jan 07, 1941      Denies any complaints.  Patient speech is getting better. Chief Complaint  Patient presents with  . Code Stroke  Patient continued to have difficulty with her speech however upper extremity strength is improving  REVIEW OF SYSTEMS:   ROS CONSTITUTIONAL: No fever, fatigue or weakness.  EYES: No blurred or double vision.  EARS, NOSE, AND THROAT: No tinnitus or ear pain.  RESPIRATORY: No cough, shortness of breath, wheezing or hemoptysis.  CARDIOVASCULAR: No chest pain, orthopnea, edema.  GASTROINTESTINAL: No nausea, vomiting, diarrhea or abdominal pain.  GENITOURINARY: No dysuria, hematuria.  ENDOCRINE: No polyuria, nocturia,  HEMATOLOGY: No anemia, easy bruising or bleeding SKIN: No rash or lesion. MUSCULOSKELETAL: No joint pain or arthritis.   NEUROLOGIC: Positive upper extremity weakness and slurred speech  pSYCHIATRY: No anxiety or depression.   DRUG ALLERGIES:   Allergies  Allergen Reactions  . Levaquin [Levofloxacin] Rash  . Penicillins Rash    Has patient had a PCN reaction causing immediate rash, facial/tongue/throat swelling, SOB or lightheadedness with hypotension: No Has patient had a PCN reaction causing severe rash involving mucus membranes or skin necrosis: No Has patient had a PCN reaction that required hospitalization: No Has patient had a PCN reaction occurring within the last 10 years: No If all of the above answers are "NO", then may proceed with Cephalosporin use.   . Trazodone Other (See Comments)    Headache and nightmares    VITALS:  Blood pressure 134/83, pulse 62, temperature 97.8 F (36.6 C), temperature source Oral, resp. rate 16, height 5' (1.524 m), weight 74.5 kg, SpO2 98 %.  PHYSICAL EXAMINATION:  GENERAL:  78 y.o.-year-old patient lying in the bed with no acute distress.  Has expressive  dysarthria. EYES: Pupils equal, round, reactive to light . No scleral icterus. Extraocular muscles intact.  HEENT: Head atraumatic, normocephalic. Oropharynx and nasopharynx clear.  NECK:  Supple, no jugular venous distention. No thyroid enlargement, no tenderness.  LUNGS: Patient noted to have some cough.  Breath sounds are clear.    CARDIOVASCULAR: S1, S2 normal. No murmurs, rubs, or gallops.  ABDOMEN: Soft, nontender, nondistended. Bowel sounds present. No organomegaly or mass.  EXTREMITIES: No pedal edema, cyanosis, or clubbing.  NEUROLOGIC: Patient is alert, oriented, Dysarthric, cranial nerves II through XII are intact except for right facial droop, diminished strength in right upper and lower extremity 4 out of 5, left upper and lower 5 out of 5 in strength.  Tone is normal.  Sensations are intact, DTR 1+ symmetric throughout.   PSYCHIATRIC: The patient is alert and oriented x 3.  SKIN: No obvious rash, lesion, or ulcer.    LABORATORY PANEL:   CBC No results for input(s): WBC, HGB, HCT, PLT in the last 168 hours. ------------------------------------------------------------------------------------------------------------------  Chemistries  No results for input(s): NA, K, CL, CO2, GLUCOSE, BUN, CREATININE, CALCIUM, MG, AST, ALT, ALKPHOS, BILITOT in the last 168 hours.  Invalid input(s): GFRCGP ------------------------------------------------------------------------------------------------------------------  Cardiac Enzymes No results for input(s): TROPONINI in the last 168 hours. ------------------------------------------------------------------------------------------------------------------  RADIOLOGY:  No results found.  EKG:   Orders placed or performed during the hospital encounter of 12/10/18  . ED EKG  . ED EKG  . EKG 12-Lead  . 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  Essential hypertension continue therapy with HCTZ lisinopril and metoprolol   3 .diabetes mellitus type 2: Not on any sliding scale insulin I will place patient on sliding scale insulin   4.  Hypothyroidism: Continue Synthyroid    5 left upper extremity swelling secondary to IV access, negative for DVT   6.  Hyperlipidemia continue Pravachol   CODE STATUS: Full code  TOTAL TIME TAKING CARE OF THIS PATIENT: 35 minutes.   Discharge to CIR when the bed is available.. Patient is medically ready today  Dustin Flock M.D on 12/17/2018 at 1:10 PM  Between 7am to 6pm - Pager - [phone]  After 6pm go to [email or web address] - password [email or web address] [facility]  Office  [phone]  CC: Primary care physician; Dion Body, MD   Note: This dictation was prepared with Dragon dictation along with smaller phrase technology. Any transcriptional errors that result from this process are unintentional.

## 2018-12-17 NOTE — Progress Notes (Signed)
Speech Language Pathology Treatment: Dysphagia(OMEs)  Patient Details Name: Meghan Dougherty MRN: [id number] DOB: 1941-03-09 Today's Date: 12/17/2018 Time: [id number] SLP Time Calculation (min) (ACUTE ONLY): 40 min  Assessment / Plan / Recommendation Clinical Impression  Pt seen for ongoing assessment of toleration of oral diet; food/liquid consistencies tolerated following precautions as per MBSS. Son present in room during session today. Pt awake and sitting EOB post PT session. Pt's Dysarthria appeared min improved; more of a motor speech/fatigue component noted during this session. Pt also endorsed fatigue as the day continues - "I am better at 9am than at 1pm"(discussed reasons behind this particularly post stroke). Pt endorsed Fatigue today "because I didn't sleep at all last night".  Briefly discussed components of support when eating meals including positioning upright and pillow support under each arm as pt stated she tends to "lean"; R arm weakness + and is "heavy" per pt report. Also discussed the swallowing strategies as recommended during the MBSS to include lingual sweeping and repeat, Dry swallow w/ each bite and sip. Pt then fed self trials of the Honey consistency liquids using her Left hand, and brining her Right hand up w/ the cup as well intermittently for ex. She followed the general aspiration precautions posted including using small, single sips. She utilized compensatory strategies including the Dry swallow. No immediate, overt s/s of aspiration were noted; continue to expect a delayed pharyngeal swallow. No overt cough post trials after finishing the swallow noted this session. Discussed the possibility of increased saliva AND the Retrograde activity of bolus material from the Esophagus back into the Pharynx possibly penetrating the upper airway - again, the importance of using f/u, Dry swallows. Oral phase was min slow and deliberate in OM movements during bolus manipulation  and management; full oral clearing achieved post swallows. No overt decline in Pulmonary status or vocal quality noted during/post intake.  Much of the session spent in education also w/ both pt and Son answering questions; reviewing the MBSS results; educating on/practicing aspiration precautions; education on pt's dysphagia; oral care; swallowing exs and OMEs. Education given on food options and preparation; foods she enjoys but in more of Minced form; dietary supplements as ordered/guided by the Dietician consulted. Recommend continue w/ current Dysphagia level 2 diet w/ HONEY consistency liquids via Cup w/ precautions as discussed above. Recommend f/u at [facility] for Dysphagia, Motor Speech deficits and f/u w/ repeat MBSS when appropriate. Recommend Dietician f/u for support.      HPI HPI: Pt is a 78 y.o. female with Diabetes, prior R breast cancer w/ XRT, hypertension, hyperlipidemia who presents with slurred speech and R sided weakness.  Patient says she developed slurred speech around 4 PM on 12/09/2018 while she was talking to her friend on the phone.  She then had a fall when her right knee gave out.  She denies any pain currently.  She continued to present w/ slurred speech and difficulty with thinking of the words at the ED but stated it was improved from prior.  Also noted is continued R sided weakness.  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  MBSS completed on 12/12/2018.      SLP Plan  Continue with current plan of care       Recommendations  Diet recommendations: Dysphagia 2 (fine chop);Honey-thick liquid Liquids provided via: Cup;No straw Medication Administration: Whole meds with puree(broken down; or swallowed w/ Honey liquids) Supervision: Patient able to self  feed;Intermittent supervision to cue for compensatory strategies(tray setup assist) Compensations: Minimize environmental distractions;Slow  rate;Small sips/bites;Lingual sweep for clearance of pocketing;Multiple dry swallows after each bite/sip;Follow solids with liquid Postural Changes and/or Swallow Maneuvers: Seated upright 90 degrees;Upright 30-60 min after meal(REFLUX precautions)                General recommendations: (Dietician f/u) Oral Care Recommendations: Staff/trained caregiver to provide oral care;Patient independent with oral care;Oral care BID;Oral care before and after PO Follow up Recommendations: Inpatient Rehab SLP Visit Diagnosis: Dysphagia, oropharyngeal phase (R13.12);Dysphagia, pharyngoesophageal phase (R13.14)(Motor Speech) Plan: Continue with current plan of care       GO                 Orinda Kenner, [place], CCC-SLP Watson,Katherine 12/17/2018, 4:23 PM

## 2018-12-18 LAB — SARS CORONAVIRUS 2 BY RT PCR (HOSPITAL ORDER, PERFORMED IN ~~LOC~~ HOSPITAL LAB): SARS Coronavirus 2: NEGATIVE

## 2018-12-18 MED ORDER — LISINOPRIL 5 MG PO TABS
5.0000 mg | ORAL_TABLET | Freq: Every evening | ORAL | Status: DC
  Administered 2018-12-18: 5 mg via ORAL
  Filled 2018-12-18: qty 1

## 2018-12-18 NOTE — Progress Notes (Signed)
Physical Therapy Treatment Patient Details Name: Meghan Dougherty MRN: [id number] DOB: [birth year] Today's Date: 12/18/2018    History of Present Illness Pt is a 78 y.o. female presenting to hospital 12/10/18 with slurred speech starting about 4 pm 9/1 and then had a fall around 2130 when R knee gave out; pt also noted with R facial droop, R sided weakness, expressive aphasia, and dysarthria.  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  PMH includes R breast CA with h/o radiation treatments and partial mastectomy 01/04/2017, DM, htn, and h/o thyroid surgery.    PT Comments    Pt ready for session.  Transitioned out of bed with improved speed and technique.  Sitting stable.  Stood and was able to walk 100' around small nursing pod with walker and min guard/assist +1.  She has improved foot placement today vs yesterday.  Stated she slept well last night which she feels is improving her movement today.  She was fatigued upon return to room with decreased step height and length and some increased difficulty processing turn to bed.  Participated in exercises as described below.  After rest, she was able to ambulate 50' x 2 taking a short rest break on bench in hallway.  To bathroom upon return to room to void.  She had difficulty with clothing management and self care.  "How am I ever going to do this at home?"  Encouragement given.  Remained in recliner after session.     Follow Up Recommendations  CIR     Equipment Recommendations  Rolling walker with 5" wheels;3in1 (PT)    Recommendations for Other Services       Precautions / Restrictions Precautions Precautions: Fall Precaution Comments: pt on thickened diet Restrictions Weight Bearing Restrictions: No    Mobility  Bed Mobility Overal bed mobility: Needs Assistance Bed Mobility: Supine to Sit     Supine to sit: HOB elevated;Supervision     General bed  mobility comments: easier today with smoother transitions.  Transfers Overall transfer level: Needs assistance Equipment used: Rolling walker (2 wheeled) Transfers: Sit to/from Stand Sit to Stand: Min guard;Min assist         General transfer comment: slow with increased time to transition to standing,  pt does stop to think about hand placements before moving.  Ambulation/Gait Ambulation/Gait assistance: Min assist;Min guard Gait Distance (Feet): 100 Feet Assistive device: Rolling walker (2 wheeled) Gait Pattern/deviations: Step-through pattern Gait velocity: decreased (especially with increased distance ambulating)   General Gait Details: 100' x 1 and 50' x 2 - fatigues with longer distances.  verbal cues for walker navigation and safe navigation of tighter spaces in room, bathroom and around equipment in hallways.   Stairs             Wheelchair Mobility    Modified Rankin (Stroke Patients Only)       Balance Overall balance assessment: Needs assistance Sitting-balance support: No upper extremity supported;Feet supported Sitting balance-Leahy Scale: Good     Standing balance support: Single extremity supported Standing balance-Leahy Scale: Fair Standing balance comment: pt requiring single UE support for static standing balance with bathroom tasks. needs assist for personal care and for clothing management.                            Cognition Arousal/Alertness: Awake/alert Behavior During Therapy: WFL for tasks assessed/performed Overall Cognitive Status: Within Functional  Limits for tasks assessed                                 General Comments: intermittent word finding difficulty requiring increased time at times      Exercises Other Exercises Other Exercises: to bathroom to void. Other Exercises: seated AROM during rest break for LAQ, marches and ab/add in unsupported sitting without LOB.  voices LE fatigue.    General  Comments        Pertinent Vitals/Pain Pain Assessment: No/denies pain    Home Living                      Prior Function            PT Goals (current goals can now be found in the care plan section) Progress towards PT goals: Progressing toward goals    Frequency    7X/week      PT Plan Current plan remains appropriate    Co-evaluation              AM-PAC PT "6 Clicks" Mobility   Outcome Measure  Help needed turning from your back to your side while in a flat bed without using bedrails?: A Little Help needed moving from lying on your back to sitting on the side of a flat bed without using bedrails?: A Little Help needed moving to and from a bed to a chair (including a wheelchair)?: A Little Help needed standing up from a chair using your arms (e.g., wheelchair or bedside chair)?: A Little Help needed to walk in hospital room?: A Little Help needed climbing 3-5 steps with a railing? : A Lot 6 Click Score: 17    End of Session Equipment Utilized During Treatment: Gait belt Activity Tolerance: Patient tolerated treatment well Patient left: in chair;with call bell/phone within reach;with chair alarm set Nurse Communication: Mobility status Hemiplegia - Right/Left: Right Hemiplegia - dominant/non-dominant: Dominant Hemiplegia - caused by: Cerebral infarction     Time: [id number] PT Time Calculation (min) (ACUTE ONLY): 24 min  Charges:  $Gait Training: 8-22 mins $Therapeutic Exercise: 8-22 mins                    Chesley Noon, PTA 12/18/18, 11:38 AM

## 2018-12-18 NOTE — Progress Notes (Signed)
OT Cancellation Note  Patient Details Name: Beily Buscemi MRN: [id number] DOB: 09/20/1940   Cancelled Treatment:    Reason Eval/Treat Not Completed: Other (comment). Per RN, pt just received new Covid test and must wait 70 minutes to enter room. Will re-attempt next date as medically appropriate.   Jeni Salles, MPH, MS, OTR/L ascom [phone] 12/18/18, 4:22 PM

## 2018-12-18 NOTE — TOC Progression Note (Addendum)
Transition of Care [facility]) - Progression Note    Patient Details  Name: Meghan Dougherty MRN: [id number] Date of Birth: March 15, 1941  Transition of Care [facility]) CM/SW [place], LCSW Phone Number: 12/18/2018, 9:58 AM  Clinical Narrative: Left voicemail for [facility] inpatient rehab admissions coordinator to check authorization status.   10:40 am: Patient has Biochemist, clinical. [facility] inpatient rehab staff will have team conference later this morning to discuss upcoming discharges and they are hoping they will have a bed for her by tomorrow. Son aware. Went by to update patient but she was on the phone. Will try again later. Patient will likely have to transport by [place]. MD aware and will order new COVID test once we know she will have a bed tomorrow.  12:11 pm: Patient updated regarding above.  3:34 pm: [facility] unable to transport patient tomorrow. EMS is unsure if they will or not because they have 9 other local discharges tomorrow. [facility] IP rehab unable to take her in the morning because the person leaving the room she's going into isn't leaving until 1:00. Called [facility] and scheduled transport for 12:00 tomorrow. Faxed over medical necessity form and facesheet. COVID test is still pending. Sent message to RN requesting it to be completed as soon as possible so there is time to get results before noon tomorrow.  4:26 pm: [facility] to confirm they received the paperwork that was faxed over and that patient is in the system for 12:00 pickup tomorrow. CSW called son to notify.  Expected Discharge Plan: IP Rehab Facility Barriers to Discharge: Continued Medical Work up  Expected Discharge Plan and Services Expected Discharge Plan: [place] In-house Referral: Clinical Social Work   Post Acute Care Choice: Home Health, IP Rehab Living arrangements for the past 2 months: Single Family Home                                       Social  Determinants of Health (SDOH) Interventions    Readmission Risk Interventions No flowsheet data found.

## 2018-12-18 NOTE — Progress Notes (Signed)
[facility] at [place] NAME: Meghan Dougherty    MR#:  [id number]  DATE OF BIRTH:  March 20, 1941      Denies any complaints.  Patient speech is getting better. Chief Complaint  Patient presents with  . Code Stroke   Continues to have difficulty with speech Awaiting rehab bed   REVIEW OF SYSTEMS:   ROS CONSTITUTIONAL: No fever, fatigue or weakness.  EYES: No blurred or double vision.  EARS, NOSE, AND THROAT: No tinnitus or ear pain.  RESPIRATORY: No cough, shortness of breath, wheezing or hemoptysis.  CARDIOVASCULAR: No chest pain, orthopnea, edema.  GASTROINTESTINAL: No nausea, vomiting, diarrhea or abdominal pain.  GENITOURINARY: No dysuria, hematuria.  ENDOCRINE: No polyuria, nocturia,  HEMATOLOGY: No anemia, easy bruising or bleeding SKIN: No rash or lesion. MUSCULOSKELETAL: No joint pain or arthritis.   NEUROLOGIC: Positive upper extremity weakness and slurred speech  pSYCHIATRY: No anxiety or depression.   DRUG ALLERGIES:   Allergies  Allergen Reactions  . Levaquin [Levofloxacin] Rash  . Penicillins Rash    Has patient had a PCN reaction causing immediate rash, facial/tongue/throat swelling, SOB or lightheadedness with hypotension: No Has patient had a PCN reaction causing severe rash involving mucus membranes or skin necrosis: No Has patient had a PCN reaction that required hospitalization: No Has patient had a PCN reaction occurring within the last 10 years: No If all of the above answers are "NO", then may proceed with Cephalosporin use.   . Trazodone Other (See Comments)    Headache and nightmares    VITALS:  Blood pressure 136/69, pulse 66, temperature 97.8 F (36.6 C), temperature source Oral, resp. rate 16, height 5' (1.524 m), weight 74.5 kg, SpO2 97 %.  PHYSICAL EXAMINATION:  GENERAL:  78 y.o.-year-old patient lying in the bed with no acute distress.  Has expressive dysarthria. EYES: Pupils equal, round,  reactive to light . No scleral icterus. Extraocular muscles intact.  HEENT: Head atraumatic, normocephalic. Oropharynx and nasopharynx clear.  NECK:  Supple, no jugular venous distention. No thyroid enlargement, no tenderness.  LUNGS: Patient noted to have some cough.  Breath sounds are clear.    CARDIOVASCULAR: S1, S2 normal. No murmurs, rubs, or gallops.  ABDOMEN: Soft, nontender, nondistended. Bowel sounds present. No organomegaly or mass.  EXTREMITIES: No pedal edema, cyanosis, or clubbing.  NEUROLOGIC: Patient is alert, oriented, Dysarthric, cranial nerves II through XII are intact except for right facial droop, diminished strength in right upper and lower extremity 4 out of 5, left upper and lower 5 out of 5 in strength.  Tone is normal.  Sensations are intact, DTR 1+ symmetric throughout.   PSYCHIATRIC: The patient is alert and oriented x 3.  SKIN: No obvious rash, lesion, or ulcer.    LABORATORY PANEL:   CBC No results for input(s): WBC, HGB, HCT, PLT in the last 168 hours. ------------------------------------------------------------------------------------------------------------------  Chemistries  No results for input(s): NA, K, CL, CO2, GLUCOSE, BUN, CREATININE, CALCIUM, MG, AST, ALT, ALKPHOS, BILITOT in the last 168 hours.  Invalid input(s): GFRCGP ------------------------------------------------------------------------------------------------------------------  Cardiac Enzymes No results for input(s): TROPONINI in the last 168 hours. ------------------------------------------------------------------------------------------------------------------  RADIOLOGY:  No results found.  EKG:   Orders placed or performed during the hospital encounter of 12/10/18  . ED EKG  . ED EKG  . EKG 12-Lead  . 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  Essential hypertension blood pressure was low yesterday night I will discontinue HCTZ adjust her lisinopril dose   3 .diabetes mellitus type 2: Continue sliding scale insulin   4.  Hypothyroidism: Continue Synthyroid    5 left upper extremity swelling secondary to IV access, negative for DVT   6.  Hyperlipidemia continue Pravachol   CODE STATUS: Full code  TOTAL TIME TAKING CARE OF THIS PATIENT: 35 minutes.   Discharge to CIR when the bed is available.. Patient is medically ready today  Dustin Flock M.D on 12/18/2018 at 12:41 PM  Between 7am to 6pm - Pager - [phone]  After 6pm go to [email or web address] - password [email or web address] [place] [facility]  Office  [phone]  CC: Primary care physician; Dion Body, MD   Note: This dictation was prepared with Dragon dictation along with smaller phrase technology. Any transcriptional errors that result from this process are unintentional.

## 2018-12-19 MED ORDER — QUETIAPINE FUMARATE 25 MG PO TABS: 12.5000 mg | ORAL_TABLET | Freq: Every day | ORAL | Status: DC

## 2018-12-19 MED ORDER — ATORVASTATIN CALCIUM 40 MG PO TABS: 40.0000 mg | ORAL_TABLET | Freq: Every day | ORAL | Status: AC

## 2018-12-19 MED ORDER — LISINOPRIL 5 MG PO TABS: 5.0000 mg | ORAL_TABLET | Freq: Every evening | ORAL | Status: DC

## 2018-12-19 MED ORDER — ASPIRIN 325 MG PO TABS: 325.0000 mg | ORAL_TABLET | Freq: Every day | ORAL | Status: DC

## 2018-12-19 NOTE — Progress Notes (Signed)
Pt d/c to [facility] via [facility]. IV removed intact. VSS. Education completed. All belongings sent with pt. Report given to RN Joycelyn Schmid at [facility]

## 2018-12-19 NOTE — Discharge Summary (Signed)
[name] at [facility], 78 y.o., DOB 06-Mar-1941, MRN [id number]. Admission date: 12/10/2018 Discharge Date 12/19/2018 Primary MD Meghan Body, MD Admitting Physician Meghan Mormon, MD  Admission Diagnosis  TIA (transient ischemic attack) [G45.9] Slurred speech [R47.81] Facial asymmetry [Q67.0]  Discharge Diagnosis   Active Problems: Acute left internal capsule stroke Essential hypertension Diabetes type 2 Hypothyroidism Left upper extremity swelling secondary to IV access DVT ruled out Hyperlipidemia    Hospital Course Patient 78 year old white female with a known history of diabetes,  dyslipidemia and hypothyroidism, presented to the emergency room with onset of slurred speech that started at 4 PM on 12/09/2018, with right facial droop and mild weakness on the right side.  Patient was evaluated and further work-up revealed her to have acute left internal capsule stroke.  Echocardiogram showed no evidence of cardioembolic cause.  CTA of the neck showed no large vessel occlusion.  Atherosclerotic disease was noted.  Is in need of inpatient rehab.    DIET : Dysphasia 2 diet with Honey Thick    Consults  neurology  Significant Tests:  See full reports for all details     Ct Angio Head W Or Wo Contrast  Result Date: 12/10/2018 CLINICAL DATA:  Initial evaluation for acute stroke. Slurred speech. EXAM: CT ANGIOGRAPHY HEAD AND NECK CT PERFUSION BRAIN TECHNIQUE: Multidetector CT imaging of the head and neck was performed using the standard protocol during bolus administration of intravenous contrast. Multiplanar CT image reconstructions and MIPs were obtained to evaluate the vascular anatomy. Carotid stenosis measurements (when applicable) are obtained utilizing NASCET criteria, using the distal internal carotid diameter as the denominator. Multiphase CT imaging of the brain was performed following IV bolus contrast injection. Subsequent  parametric perfusion maps were calculated using RAPID software. CONTRAST:  146mL OMNIPAQUE IOHEXOL 350 MG/ML SOLN COMPARISON:  Prior CT from earlier the same day. FINDINGS: CTA NECK FINDINGS Aortic arch: Visualized aortic arch normal in caliber with normal 3 vessel morphology. Mild atheromatous plaque along the undersurface of the arch and about the origin of the great vessels without hemodynamically significant stenosis. Visualized subclavian arteries widely patent. Right carotid system: Right CCA patent from its origin to the bifurcation without stenosis. Mild calcified plaque about the right bifurcation/proximal right ICA without hemodynamically significant stenosis. Right ICA widely patent distally to the skull base without stenosis, dissection or occlusion. Left carotid system: Left CCA patent from its origin to the bifurcation without stenosis. Bulky calcified plaque about the left bifurcation/proximal left ICA with associated mild stenosis of up to approximately 35% by NASCET criteria. Left ICA widely patent distally to the skull base without stenosis, dissection, or occlusion. Vertebral arteries: Both vertebral arteries arise from the subclavian arteries. Right vertebral artery dominant. Vertebral arteries widely patent within the neck without stenosis, dissection, or occlusion. Skeleton: No acute osseous finding. No discrete lytic or blastic osseous lesions. Moderate cervical spondylolysis noted at C3-4 through C6-7. Other neck: No other acute soft tissue abnormality within the neck. Thyroid appears to be absent. Upper chest: Visualized upper chest demonstrates no acute finding. Scattered pleuroparenchymal thickening with linear scarring noted within the right greater than left upper lobes. Review of the MIP images confirms the above findings CTA HEAD FINDINGS Anterior circulation: Petrous segments widely patent bilaterally. Mild atheromatous plaque within the cavernous/supraclinoid ICAs without  hemodynamically significant stenosis. A1 segments widely patent. Normal anterior communicating artery. Anterior cerebral arteries widely patent to their distal aspects without stenosis. No M1 stenosis or  occlusion. Normal MCA bifurcations. Distal MCA branches well perfused and symmetric. Posterior circulation: Vertebral arteries widely patent to the vertebrobasilar junction without stenosis. Right vertebral artery dominant. Posteroinferior cerebral arteries patent bilaterally. 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 Basilar widely patent to its distal aspect without stenosis. Superior cerebellar and posterior cerebral arteries widely patent bilaterally. Venous sinuses: Patent. Anatomic variants: None significant. Review of the MIP images confirms the above findings CT Brain Perfusion Findings: ASPECTS: 10 CBF (<30%) Volume: 55mL Perfusion (Tmax>6.0s) volume: 8mL Mismatch Volume: 22mL Infarction Location:Negative CT perfusion with no evidence for core infarct or perfusion abnormality. IMPRESSION: 1. Negative CTA of the head and neck with no evidence for large vessel occlusion. 2. Negative CT perfusion, with no evidence for acute core infarct or perfusion abnormality. 3. Mild atherosclerotic change about the left carotid bifurcation with associated mild 35% stenosis. 4. No other hemodynamically significant or correctable stenosis seen about the major arterial vasculature of the head and neck. A telephone call to the ordering clinician, Dr. Ruffin Dougherty was made at 2:45 a.m. on 12/10/2018. A message was left with the clinician conveying these results. Electronically Signed   By: Meghan Dougherty M.D.   On: 12/10/2018 02:51   Dg Chest 2 View  Result Date: 12/10/2018 CLINICAL DATA:  TIA. EXAM: CHEST - 2 VIEW COMPARISON:  None. FINDINGS: Heart size is normal. Chronic interstitial coarsening is noted. There is no edema or effusion. No focal airspace disease is present. Chronic scarring is  noted at the left base. Degenerative changes are noted in the thoracic spine. IMPRESSION: 1. No acute cardiopulmonary disease. 2. Chronic changes of COPD. Electronically Signed   By: Meghan Dougherty M.D.   On: 12/10/2018 04:09   Ct Angio Neck W Or Wo Contrast  Result Date: 12/10/2018 CLINICAL DATA:  Initial evaluation for acute stroke. Slurred speech. EXAM: CT ANGIOGRAPHY HEAD AND NECK CT PERFUSION BRAIN TECHNIQUE: Multidetector CT imaging of the head and neck was performed using the standard protocol during bolus administration of intravenous contrast. Multiplanar CT image reconstructions and MIPs were obtained to evaluate the vascular anatomy. Carotid stenosis measurements (when applicable) are obtained utilizing NASCET criteria, using the distal internal carotid diameter as the denominator. Multiphase CT imaging of the brain was performed following IV bolus contrast injection. Subsequent parametric perfusion maps were calculated using RAPID software. CONTRAST:  163mL OMNIPAQUE IOHEXOL 350 MG/ML SOLN COMPARISON:  Prior CT from earlier the same day. FINDINGS: CTA NECK FINDINGS Aortic arch: Visualized aortic arch normal in caliber with normal 3 vessel morphology. Mild atheromatous plaque along the undersurface of the arch and about the origin of the great vessels without hemodynamically significant stenosis. Visualized subclavian arteries widely patent. Right carotid system: Right CCA patent from its origin to the bifurcation without stenosis. Mild calcified plaque about the right bifurcation/proximal right ICA without hemodynamically significant stenosis. Right ICA widely patent distally to the skull base without stenosis, dissection or occlusion. Left carotid system: Left CCA patent from its origin to the bifurcation without stenosis. Bulky calcified plaque about the left bifurcation/proximal left ICA with associated mild stenosis of up to approximately 35% by NASCET criteria. Left ICA widely patent  distally to the skull base without stenosis, dissection, or occlusion. Vertebral arteries: Both vertebral arteries arise from the subclavian arteries. Right vertebral artery dominant. Vertebral arteries widely patent within the neck without stenosis, dissection, or occlusion. Skeleton: No acute osseous finding. No discrete lytic or blastic osseous lesions. Moderate cervical spondylolysis noted at C3-4 through C6-7. Other neck:  No other acute soft tissue abnormality within the neck. Thyroid appears to be absent. Upper chest: Visualized upper chest demonstrates no acute finding. Scattered pleuroparenchymal thickening with linear scarring noted within the right greater than left upper lobes. Review of the MIP images confirms the above findings CTA HEAD FINDINGS Anterior circulation: Petrous segments widely patent bilaterally. Mild atheromatous plaque within the cavernous/supraclinoid ICAs without hemodynamically significant stenosis. A1 segments widely patent. Normal anterior communicating artery. Anterior cerebral arteries widely patent to their distal aspects without stenosis. No M1 stenosis or occlusion. Normal MCA bifurcations. Distal MCA branches well perfused and symmetric. Posterior circulation: Vertebral arteries widely patent to the vertebrobasilar junction without stenosis. Right vertebral artery dominant. Posteroinferior cerebral arteries patent bilaterally. 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 Basilar widely patent to its distal aspect without stenosis. Superior cerebellar and posterior cerebral arteries widely patent bilaterally. Venous sinuses: Patent. Anatomic variants: None significant. Review of the MIP images confirms the above findings CT Brain Perfusion Findings: ASPECTS: 10 CBF (<30%) Volume: 58mL Perfusion (Tmax>6.0s) volume: 51mL Mismatch Volume: 81mL Infarction Location:Negative CT perfusion with no evidence for core infarct or perfusion abnormality. IMPRESSION: 1.  Negative CTA of the head and neck with no evidence for large vessel occlusion. 2. Negative CT perfusion, with no evidence for acute core infarct or perfusion abnormality. 3. Mild atherosclerotic change about the left carotid bifurcation with associated mild 35% stenosis. 4. No other hemodynamically significant or correctable stenosis seen about the major arterial vasculature of the head and neck. A telephone call to the ordering clinician, Dr. Ruffin Dougherty was made at 2:45 a.m. on 12/10/2018. A message was left with the clinician conveying these results. Electronically Signed   By: Meghan Dougherty M.D.   On: 12/10/2018 02:51   Mr Brain Wo Contrast  Result Date: 12/10/2018 CLINICAL DATA:  TIA, initial exam. Acute onset of slurred speech and difficulty with word finding beginning at 9 o'clock p.m. last night. EXAM: MRI HEAD WITHOUT CONTRAST TECHNIQUE: Multiplanar, multiecho pulse sequences of the brain and surrounding structures were obtained without intravenous contrast. COMPARISON:  CTA head and neck and CT perfusion 12/10/2018 FINDINGS: Brain: The diffusion-weighted images demonstrate restricted diffusion consistent with an acute nonhemorrhagic infarct in the posterior limb of the left internal capsule. The area of infarction there is linear measuring 11 mm. Moderate confluent periventricular and scattered subcortical T2 hyperintensities are present bilaterally. Dilated perivascular spaces are present throughout the basal ganglia. The ventricles are proportionate to the degree of atrophy. Corpus callosum is normal. No significant extraaxial fluid collection is present. The internal auditory canals are within normal limits. The brainstem and cerebellum are within normal limits. Vascular: Flow is present in the major intracranial arteries. Skull and upper cervical spine: The craniocervical junction is normal. Upper cervical spine is within normal limits. Marrow signal is unremarkable. Sinuses/Orbits: The  paranasal sinuses and mastoid air cells are clear. Bilateral lens replacements are noted. Globes and orbits are otherwise unremarkable. IMPRESSION: 1. 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 2. Moderate diffuse confluent periventricular and scattered subcortical T2 hyperintensities bilaterally consistent with chronic microvascular ischemic change. Electronically Signed   By: Meghan Dougherty M.D.   On: 12/10/2018 05:13   US Venous Img Upper Uni Left  Result Date: 12/13/2018 CLINICAL DATA:  Swelling, redness. Possible vascular injury of left arm EXAM: LEFT UPPER EXTREMITY VENOUS DOPPLER ULTRASOUND TECHNIQUE: Gray-scale sonography with graded compression, as well as color Doppler and duplex ultrasound were performed to  evaluate the upper extremity deep venous system from the level of the subclavian vein and including the jugular, axillary, basilic, radial, ulnar and upper cephalic vein. Spectral Doppler was utilized to evaluate flow at rest and with distal augmentation maneuvers. COMPARISON:  None. FINDINGS: Contralateral Subclavian Vein: Respiratory phasicity is normal and symmetric with the symptomatic side. No evidence of thrombus. Normal compressibility. Internal Jugular Vein: No evidence of thrombus. Normal compressibility, respiratory phasicity and response to augmentation. Subclavian Vein: No evidence of thrombus. Normal compressibility, respiratory phasicity and response to augmentation. Axillary Vein: No evidence of thrombus. Normal compressibility, respiratory phasicity and response to augmentation. Cephalic Vein: No evidence of thrombus. Normal compressibility, respiratory phasicity and response to augmentation. Basilic Vein: No evidence of thrombus. Normal compressibility, respiratory phasicity and response to augmentation. Brachial Veins: No evidence of thrombus. Normal compressibility, respiratory phasicity and response to  augmentation. Radial Veins: No evidence of thrombus. Normal compressibility, respiratory phasicity and response to augmentation. Ulnar Veins: No evidence of thrombus. Normal compressibility, respiratory phasicity and response to augmentation. Venous Reflux:  None visualized. Other Findings: Ultrasound in the area of bruising on the left forearm demonstrates no focal fluid collection or other abnormality. IMPRESSION: No evidence of DVT within the left upper extremity. No evidence of vascular injury. Electronically Signed   By: Rolm Baptise M.D.   On: 12/13/2018 21:33   Ct Cerebral Perfusion W Contrast  Result Date: 12/10/2018 CLINICAL DATA:  Initial evaluation for acute stroke. Slurred speech. EXAM: CT ANGIOGRAPHY HEAD AND NECK CT PERFUSION BRAIN TECHNIQUE: Multidetector CT imaging of the head and neck was performed using the standard protocol during bolus administration of intravenous contrast. Multiplanar CT image reconstructions and MIPs were obtained to evaluate the vascular anatomy. Carotid stenosis measurements (when applicable) are obtained utilizing NASCET criteria, using the distal internal carotid diameter as the denominator. Multiphase CT imaging of the brain was performed following IV bolus contrast injection. Subsequent parametric perfusion maps were calculated using RAPID software. CONTRAST:  148mL OMNIPAQUE IOHEXOL 350 MG/ML SOLN COMPARISON:  Prior CT from earlier the same day. FINDINGS: CTA NECK FINDINGS Aortic arch: Visualized aortic arch normal in caliber with normal 3 vessel morphology. Mild atheromatous plaque along the undersurface of the arch and about the origin of the great vessels without hemodynamically significant stenosis. Visualized subclavian arteries widely patent. Right carotid system: Right CCA patent from its origin to the bifurcation without stenosis. Mild calcified plaque about the right bifurcation/proximal right ICA without hemodynamically significant stenosis. Right ICA  widely patent distally to the skull base without stenosis, dissection or occlusion. Left carotid system: Left CCA patent from its origin to the bifurcation without stenosis. Bulky calcified plaque about the left bifurcation/proximal left ICA with associated mild stenosis of up to approximately 35% by NASCET criteria. Left ICA widely patent distally to the skull base without stenosis, dissection, or occlusion. Vertebral arteries: Both vertebral arteries arise from the subclavian arteries. Right vertebral artery dominant. Vertebral arteries widely patent within the neck without stenosis, dissection, or occlusion. Skeleton: No acute osseous finding. No discrete lytic or blastic osseous lesions. Moderate cervical spondylolysis noted at C3-4 through C6-7. Other neck: No other acute soft tissue abnormality within the neck. Thyroid appears to be absent. Upper chest: Visualized upper chest demonstrates no acute finding. Scattered pleuroparenchymal thickening with linear scarring noted within the right greater than left upper lobes. Review of the MIP images confirms the above findings CTA HEAD FINDINGS Anterior circulation: Petrous segments widely patent bilaterally. Mild atheromatous plaque within the cavernous/supraclinoid ICAs without  hemodynamically significant stenosis. A1 segments widely patent. Normal anterior communicating artery. Anterior cerebral arteries widely patent to their distal aspects without stenosis. No M1 stenosis or occlusion. Normal MCA bifurcations. Distal MCA branches well perfused and symmetric. Posterior circulation: Vertebral arteries widely patent to the vertebrobasilar junction without stenosis. Right vertebral artery dominant. Posteroinferior cerebral arteries patent bilaterally. 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 Basilar widely patent to its distal aspect without stenosis. Superior cerebellar and posterior cerebral arteries widely patent bilaterally. Venous  sinuses: Patent. Anatomic variants: None significant. Review of the MIP images confirms the above findings CT Brain Perfusion Findings: ASPECTS: 10 CBF (<30%) Volume: 39mL Perfusion (Tmax>6.0s) volume: 36mL Mismatch Volume: 20mL Infarction Location:Negative CT perfusion with no evidence for core infarct or perfusion abnormality. IMPRESSION: 1. Negative CTA of the head and neck with no evidence for large vessel occlusion. 2. Negative CT perfusion, with no evidence for acute core infarct or perfusion abnormality. 3. Mild atherosclerotic change about the left carotid bifurcation with associated mild 35% stenosis. 4. No other hemodynamically significant or correctable stenosis seen about the major arterial vasculature of the head and neck. A telephone call to the ordering clinician, Dr. Ruffin Dougherty was made at 2:45 a.m. on 12/10/2018. A message was left with the clinician conveying these results. Electronically Signed   By: Meghan Dougherty M.D.   On: 12/10/2018 02:51   Mm Diag Breast Tomo Bilateral  Result Date: 11/25/2018 CLINICAL DATA:  78 year old female for annual follow-up. History of RIGHT breast cancer and lumpectomy in 2018. EXAM: DIGITAL DIAGNOSTIC BILATERAL MAMMOGRAM WITH CAD AND TOMO COMPARISON:  Previous exam(s). ACR Breast Density Category b: There are scattered areas of fibroglandular density. FINDINGS: 2D and 3D full field views of both breasts and a magnification views of the lumpectomy site demonstrate no suspicious mass, nonsurgical distortion or worrisome calcifications. RIGHT lumpectomy changes and developing dystrophic calcifications are noted. Mammographic images were processed with CAD. IMPRESSION: No mammographic evidence of breast malignancy. RECOMMENDATION: Bilateral diagnostic mammogram in 1 year I have discussed the findings and recommendations with the patient. If applicable, a reminder letter will be sent to the patient regarding the next appointment. BI-RADS CATEGORY  2: Benign.  Electronically Signed   By: Margarette Canada M.D.   On: 11/25/2018 10:36   Ct Head Code Stroke Wo Contrast  Result Date: 12/10/2018 CLINICAL DATA:  Code stroke. Initial evaluation for acute slurred speech. EXAM: CT HEAD WITHOUT CONTRAST TECHNIQUE: Contiguous axial images were obtained from the base of the skull through the vertex without intravenous contrast. COMPARISON:  None available. FINDINGS: Brain: Moderate age-related cerebral atrophy. Patchy and confluent hypodensity involving the periventricular deep white matter both cerebral hemispheres most consistent with chronic microvascular ischemic disease, moderately advanced. No acute intracranial hemorrhage. No acute large vessel territory infarct. No mass lesion, midline shift or mass effect. No hydrocephalus. No extra-axial fluid collection. Vascular: No hyperdense vessel. Calcified atherosclerosis present at skull base. Skull: Scalp soft tissues demonstrate no acute finding. Calvarium intact. Sinuses/Orbits: Globes and orbital soft tissues within normal limits. Visualized paranasal sinuses are clear. No mastoid effusion. Other: None. ASPECTS [facility] Stroke Program Early CT Score) - Ganglionic level infarction (caudate, lentiform nuclei, internal capsule, insula, M1-M3 cortex): 7 - Supraganglionic infarction (M4-M6 cortex): 3 Total score (0-10 with 10 being normal): 10 IMPRESSION: 1. No acute intracranial infarct or other abnormality identified. 2. ASPECTS is 10. 3. Moderately advanced cerebral atrophy with chronic microvascular ischemic disease. Critical Value/emergent results were called by telephone at the time of interpretation  on 12/10/2018 at 12:33 am to Dr. Karma Greaser, who verbally acknowledged these results. Electronically Signed   By: Meghan Dougherty M.D.   On: 12/10/2018 00:38       Today   Subjective:   Meghan Dougherty patient continues to have slurred speech and some right upper extremity weakness  Objective:   Blood pressure (!) 114/59,  pulse 62, temperature 98 F (36.7 C), temperature source Oral, resp. rate 16, height 5' (1.524 m), weight 74.5 kg, SpO2 96 %.  .  Intake/Output Summary (Last 24 hours) at 12/19/2018 0753 Last data filed at 12/18/2018 1034 Gross per 24 hour  Intake 360 ml  Output -  Net 360 ml    Exam VITAL SIGNS: Blood pressure (!) 114/59, pulse 62, temperature 98 F (36.7 C), temperature source Oral, resp. rate 16, height 5' (1.524 m), weight 74.5 kg, SpO2 96 %.  GENERAL:  78 y.o.-year-old patient lying in the bed with no acute distress.  EYES: Pupils equal, round, reactive to light and accommodation. No scleral icterus. Extraocular muscles intact.  HEENT: Head atraumatic, normocephalic. Oropharynx and nasopharynx clear.  NECK:  Supple, no jugular venous distention. No thyroid enlargement, no tenderness.  LUNGS: Normal breath sounds bilaterally, no wheezing, rales,rhonchi or crepitation. No use of accessory muscles of respiration.  CARDIOVASCULAR: S1, S2 normal. No murmurs, rubs, or gallops.  ABDOMEN: Soft, nontender, nondistended. Bowel sounds present. No organomegaly or mass.  EXTREMITIES: No pedal edema, cyanosis, or clubbing.  NEUROLOGIC: Diminished strength in the right upper extremity, has a slurred speech. PSYCHIATRIC: The patient is alert and oriented x 3.  SKIN: No obvious rash, lesion, or ulcer.   Data Review     CBC w Diff:  Lab Results  Component Value Date   WBC 9.5 12/10/2018   HGB 13.8 12/10/2018   HCT 50.5 (H) 12/10/2018   PLT 300 12/10/2018   LYMPHOPCT 15 12/10/2018   MONOPCT 12 12/10/2018   EOSPCT 2 12/10/2018   BASOPCT 1 12/10/2018   CMP:  Lab Results  Component Value Date   NA 132 (L) 12/10/2018   K 3.7 12/10/2018   CL 96 (L) 12/10/2018   CO2 23 12/10/2018   BUN 15 12/10/2018   CREATININE 0.89 12/10/2018   CREATININE 0.80 12/04/2011   PROT 7.4 12/10/2018   ALBUMIN 4.3 12/10/2018   BILITOT 0.6 12/10/2018   ALKPHOS 76 12/10/2018   AST 31 12/10/2018   ALT 21  12/10/2018  .  Micro Results Recent Results (from the past 240 hour(s))  SARS Coronavirus 2 [facility] order, Performed in [facility] hospital lab) Nasopharyngeal Nasopharyngeal Swab     Status: None   Collection Time: 12/10/18 12:34 AM   Specimen: Nasopharyngeal Swab  Result Value Ref Range Status   SARS Coronavirus 2 NEGATIVE NEGATIVE Final    Comment: (NOTE) If result is NEGATIVE SARS-CoV-2 target nucleic acids are NOT DETECTED. The SARS-CoV-2 RNA is generally detectable in upper and lower  respiratory specimens during the acute phase of infection. The lowest  concentration of SARS-CoV-2 viral copies this assay can detect is 250  copies / mL. A negative result does not preclude SARS-CoV-2 infection  and should not be used as the sole basis for treatment or other  patient management decisions.  A negative result may occur with  improper specimen collection / handling, submission of specimen other  than nasopharyngeal swab, presence of viral mutation(s) within the  areas targeted by this assay, and inadequate number of viral copies  (<250 copies / mL). A  negative result must be combined with clinical  observations, patient history, and epidemiological information. If result is POSITIVE SARS-CoV-2 target nucleic acids are DETECTED. The SARS-CoV-2 RNA is generally detectable in upper and lower  respiratory specimens dur ing the acute phase of infection.  Positive  results are indicative of active infection with SARS-CoV-2.  Clinical  correlation with patient history and other diagnostic information is  necessary to determine patient infection status.  Positive results do  not rule out bacterial infection or co-infection with other viruses. If result is PRESUMPTIVE POSTIVE SARS-CoV-2 nucleic acids MAY BE PRESENT.   A presumptive positive result was obtained on the submitted specimen  and confirmed on repeat testing.  While 2019 novel coronavirus  (SARS-CoV-2) nucleic acids may be  present in the submitted sample  additional confirmatory testing may be necessary for epidemiological  and / or clinical management purposes  to differentiate between  SARS-CoV-2 and other Sarbecovirus currently known to infect humans.  If clinically indicated additional testing with an alternate test  methodology [phone]) is advised. The SARS-CoV-2 RNA is generally  detectable in upper and lower respiratory sp ecimens during the acute  phase of infection. The expected result is Negative. Fact Sheet for Patients:  StrictlyIdeas.no Fact Sheet for Healthcare Providers: BankingDealers.co.za This test is not yet approved or cleared by the [place] FDA and has been authorized for detection and/or diagnosis of SARS-CoV-2 by FDA under an Emergency Use Authorization (EUA).  This EUA will remain in effect (meaning this test can be used) for the duration of the COVID-19 declaration under Section 564(b)(1) of the Act, 21 U.S.C. section 360bbb-3(b)(1), unless the authorization is terminated or revoked sooner. Performed at [facility], [address] [place], [place] [address]   SARS Coronavirus 2 [facility] order, Performed in [facility] hospital lab) Nasopharyngeal Nasopharyngeal Swab     Status: None   Collection Time: 12/16/18  2:14 PM   Specimen: Nasopharyngeal Swab  Result Value Ref Range Status   SARS Coronavirus 2 NEGATIVE NEGATIVE Final    Comment: (NOTE) If result is NEGATIVE SARS-CoV-2 target nucleic acids are NOT DETECTED. The SARS-CoV-2 RNA is generally detectable in upper and lower  respiratory specimens during the acute phase of infection. The lowest  concentration of SARS-CoV-2 viral copies this assay can detect is 250  copies / mL. A negative result does not preclude SARS-CoV-2 infection  and should not be used as the sole basis for treatment or other  patient management decisions.  A negative result may occur  with  improper specimen collection / handling, submission of specimen other  than nasopharyngeal swab, presence of viral mutation(s) within the  areas targeted by this assay, and inadequate number of viral copies  (<250 copies / mL). A negative result must be combined with clinical  observations, patient history, and epidemiological information. If result is POSITIVE SARS-CoV-2 target nucleic acids are DETECTED. The SARS-CoV-2 RNA is generally detectable in upper and lower  respiratory specimens dur ing the acute phase of infection.  Positive  results are indicative of active infection with SARS-CoV-2.  Clinical  correlation with patient history and other diagnostic information is  necessary to determine patient infection status.  Positive results do  not rule out bacterial infection or co-infection with other viruses. If result is PRESUMPTIVE POSTIVE SARS-CoV-2 nucleic acids MAY BE PRESENT.   A presumptive positive result was obtained on the submitted specimen  and confirmed on repeat testing.  While 2019 novel coronavirus  (SARS-CoV-2) nucleic acids  may be present in the submitted sample  additional confirmatory testing may be necessary for epidemiological  and / or clinical management purposes  to differentiate between  SARS-CoV-2 and other Sarbecovirus currently known to infect humans.  If clinically indicated additional testing with an alternate test  methodology [phone]) is advised. The SARS-CoV-2 RNA is generally  detectable in upper and lower respiratory sp ecimens during the acute  phase of infection. The expected result is Negative. Fact Sheet for Patients:  StrictlyIdeas.no Fact Sheet for Healthcare Providers: BankingDealers.co.za This test is not yet approved or cleared by the [place] FDA and has been authorized for detection and/or diagnosis of SARS-CoV-2 by FDA under an Emergency Use Authorization (EUA).  This EUA  will remain in effect (meaning this test can be used) for the duration of the COVID-19 declaration under Section 564(b)(1) of the Act, 21 U.S.C. section 360bbb-3(b)(1), unless the authorization is terminated or revoked sooner. Performed at [facility], [address] [place], [place] [address]   SARS Coronavirus 2 [facility] order, Performed in [facility] hospital lab) Nasopharyngeal Nasopharyngeal Swab     Status: None   Collection Time: 12/18/18  4:07 PM   Specimen: Nasopharyngeal Swab  Result Value Ref Range Status   SARS Coronavirus 2 NEGATIVE NEGATIVE Final    Comment: (NOTE) If result is NEGATIVE SARS-CoV-2 target nucleic acids are NOT DETECTED. The SARS-CoV-2 RNA is generally detectable in upper and lower  respiratory specimens during the acute phase of infection. The lowest  concentration of SARS-CoV-2 viral copies this assay can detect is 250  copies / mL. A negative result does not preclude SARS-CoV-2 infection  and should not be used as the sole basis for treatment or other  patient management decisions.  A negative result may occur with  improper specimen collection / handling, submission of specimen other  than nasopharyngeal swab, presence of viral mutation(s) within the  areas targeted by this assay, and inadequate number of viral copies  (<250 copies / mL). A negative result must be combined with clinical  observations, patient history, and epidemiological information. If result is POSITIVE SARS-CoV-2 target nucleic acids are DETECTED. The SARS-CoV-2 RNA is generally detectable in upper and lower  respiratory specimens dur ing the acute phase of infection.  Positive  results are indicative of active infection with SARS-CoV-2.  Clinical  correlation with patient history and other diagnostic information is  necessary to determine patient infection status.  Positive results do  not rule out bacterial infection or co-infection with other viruses. If result  is PRESUMPTIVE POSTIVE SARS-CoV-2 nucleic acids MAY BE PRESENT.   A presumptive positive result was obtained on the submitted specimen  and confirmed on repeat testing.  While 2019 novel coronavirus  (SARS-CoV-2) nucleic acids may be present in the submitted sample  additional confirmatory testing may be necessary for epidemiological  and / or clinical management purposes  to differentiate between  SARS-CoV-2 and other Sarbecovirus currently known to infect humans.  If clinically indicated additional testing with an alternate test  methodology [phone]) is advised. The SARS-CoV-2 RNA is generally  detectable in upper and lower respiratory sp ecimens during the acute  phase of infection. The expected result is Negative. Fact Sheet for Patients:  StrictlyIdeas.no Fact Sheet for Healthcare Providers: BankingDealers.co.za This test is not yet approved or cleared by the [place] FDA and has been authorized for detection and/or diagnosis of SARS-CoV-2 by FDA under an Emergency Use Authorization (EUA).  This EUA will remain in  effect (meaning this test can be used) for the duration of the COVID-19 declaration under Section 564(b)(1) of the Act, 21 U.S.C. section 360bbb-3(b)(1), unless the authorization is terminated or revoked sooner. Performed at [facility], [address] [place], [place] [address]         Code Status Orders  (From admission, onward)         Start     Ordered   12/10/18 0315  Full code  Continuous     12/10/18 0323        Code Status History    This patient has a current code status but no historical code status.   Advance Care Planning Activity    Advance Directive Documentation     Most Recent Value  Type of Advance Directive  Healthcare Power of Attorney, Living will  Pre-existing out of facility DNR order (yellow form or pink MOST form)  -  "MOST" Form in Place?  -           Follow-up Information    Meghan Body, MD Follow up in 4 week(s).   Specialty: Family Medicine Why: after d/c from rehab Contact information: [address] [place] [phone]           Discharge Medications   Allergies as of 12/19/2018      Reactions   Levaquin [levofloxacin] Rash   Penicillins Rash   Has patient had a PCN reaction causing immediate rash, facial/tongue/throat swelling, SOB or lightheadedness with hypotension: No Has patient had a PCN reaction causing severe rash involving mucus membranes or skin necrosis: No Has patient had a PCN reaction that required hospitalization: No Has patient had a PCN reaction occurring within the last 10 years: No If all of the above answers are "NO", then may proceed with Cephalosporin use.   Trazodone Other (See Comments)   Headache and nightmares      Medication List    STOP taking these medications   amLODipine 5 MG tablet Commonly known as: NORVASC   aspirin EC 81 MG tablet Replaced by: aspirin 325 MG tablet   hydrochlorothiazide 25 MG tablet Commonly known as: HYDRODIURIL   loperamide 2 MG tablet Commonly known as: IMODIUM A-D   zolpidem 5 MG tablet Commonly known as: AMBIEN     TAKE these medications   acetaminophen 500 MG tablet Commonly known as: TYLENOL Take 250-500 mg by mouth every 6 (six) hours as needed for mild pain.   aspirin 325 MG tablet Take 1 tablet (325 mg total) by mouth daily. Replaces: aspirin EC 81 MG tablet   atorvastatin 40 MG tablet Commonly known as: LIPITOR Take 1 tablet (40 mg total) by mouth daily at 6 PM.   calcium-vitamin D 500-200 MG-UNIT tablet Commonly known as: OSCAL WITH D Take 2 tablets by mouth daily.   FISH OIL PO Take 1 capsule by mouth daily.   fluticasone 50 MCG/ACT nasal spray Commonly known as: FLONASE Place 1 spray into both nostrils daily as needed for allergies or rhinitis.   levothyroxine  50 MCG tablet Commonly known as: SYNTHROID Take 50 mcg by mouth daily before breakfast.   lisinopril 5 MG tablet Commonly known as: ZESTRIL Take 1 tablet (5 mg total) by mouth every evening. What changed:   medication strength  how much to take   metoprolol tartrate 100 MG tablet Commonly known as: LOPRESSOR Take 100 mg by mouth 2 (two) times daily.   multivitamin with minerals Tabs  tablet Take 2 tablets by mouth daily.   omeprazole 20 MG capsule Commonly known as: PRILOSEC Take 20 mg by mouth daily as needed (heartburn).   PARoxetine 10 MG tablet Commonly known as: PAXIL Take 10 mg by mouth at bedtime.   pravastatin 10 MG tablet Commonly known as: PRAVACHOL Take 10 mg by mouth every evening.   PROBIOTIC DAILY PO Take 1 capsule by mouth daily.   QUEtiapine 25 MG tablet Commonly known as: SEROQUEL Take 0.5 tablets (12.5 mg total) by mouth at bedtime.          Total Time in preparing paper work, data evaluation and todays exam - 37 minutes  Dustin Flock M.D on 12/19/2018 at 7:53 AM [place]  [phone]

## 2018-12-19 NOTE — TOC Transition Note (Signed)
Transition of Care [facility]) - CM/SW Discharge Note   Patient Details  Name: Meghan Dougherty MRN: [id number] Date of Birth: 01-10-1941  Transition of Care [facility]) CM/SW Contact:  Hermann Dottavio, Lenice Llamas Phone Number: [phone]  12/19/2018, 10:14 AM   Clinical Narrative: Patient is medically stable for D/C to [facility] inpatient rehab in [place] today. Per [facility] admissions coordinator at [facility] patient can come today. Clinical Education officer, museum (CSW) faxed D/C summary and negative covid results to [employer]. Most recent negative covid test was 9/10. RN will call report and [facility] will transport patient at 12 noon today. Per [facility] staff member they do not require the EMTALA form. Patient is aware of above. CSW contacted patient's son [name] and made him aware of above. Please reconsult if future social work needs arise. CSW signing off.      Final next level of care: IP Rehab Facility Barriers to Discharge: Barriers Resolved   Patient Goals and CMS Choice Patient states their goals for this hospitalization and ongoing recovery are:: To go to inpatient rehab. CMS Medicare.gov Compare Post Acute Care list provided to:: Patient Choice offered to / list presented to : Patient  Discharge Placement              Patient chooses bed at: Other - please specify in the comment section below:([facility] inpatient rehab.) Patient to be transferred to facility by: [facility] Name of family member notified: Patient's son [name] is aware of D/C today. Patient and family notified of of transfer: 12/19/18  Discharge Plan and Services In-house Referral: Clinical Social Work   Post Acute Care Choice: Home Health, IP Rehab          DME Arranged: N/A         HH Arranged: NA          Social Determinants of Health (SDOH) Interventions     Readmission Risk Interventions No flowsheet data found.

## 2019-01-16 ENCOUNTER — Ambulatory Visit: Payer: Medicare Other | Admitting: Occupational Therapy

## 2019-01-16 ENCOUNTER — Other Ambulatory Visit: Payer: Self-pay

## 2019-01-16 ENCOUNTER — Encounter: Payer: Self-pay | Admitting: Speech Pathology

## 2019-01-16 ENCOUNTER — Ambulatory Visit: Payer: Medicare Other | Attending: Physical Medicine and Rehabilitation | Admitting: Speech Pathology

## 2019-01-16 ENCOUNTER — Encounter: Payer: Self-pay | Admitting: Occupational Therapy

## 2019-01-16 DIAGNOSIS — I69351 Hemiplegia and hemiparesis following cerebral infarction affecting right dominant side: Secondary | ICD-10-CM | POA: Diagnosis present

## 2019-01-16 DIAGNOSIS — R1313 Dysphagia, pharyngeal phase: Secondary | ICD-10-CM | POA: Insufficient documentation

## 2019-01-16 DIAGNOSIS — R2681 Unsteadiness on feet: Secondary | ICD-10-CM | POA: Insufficient documentation

## 2019-01-16 DIAGNOSIS — M6281 Muscle weakness (generalized): Secondary | ICD-10-CM | POA: Diagnosis present

## 2019-01-16 DIAGNOSIS — R2689 Other abnormalities of gait and mobility: Secondary | ICD-10-CM | POA: Diagnosis present

## 2019-01-16 DIAGNOSIS — R278 Other lack of coordination: Secondary | ICD-10-CM | POA: Diagnosis present

## 2019-01-16 DIAGNOSIS — R4701 Aphasia: Secondary | ICD-10-CM

## 2019-01-16 NOTE — Therapy (Signed)
[place] MAIN [facility] SERVICES [address] [place], [place], [address] Phone: [phone]   Fax:  [phone]  Occupational Therapy Evaluation  Patient Details  Name: Meghan Dougherty MRN: [id number] Date of Birth: [birth year] No data recorded  Encounter Date: 01/16/2019  OT End of Session - 01/16/19 1432    Visit Number  1    Number of Visits  24    Date for OT Re-Evaluation  04/10/19    OT Start Time  1100    OT Stop Time  1158    OT Time Calculation (min)  58 min    Activity Tolerance  Patient tolerated treatment well    Behavior During Therapy  [facility] for tasks assessed/performed       Past Medical History:  Diagnosis Date  . Borderline diabetes   . Breast cancer, right ([place]) 11/2016   invasive mammary carcinoma, Lumpectomy and rad tx's.   . Colon polyp   . Diabetes mellitus without complication ([place])    type 2  . Hyperlipemia   . Hypertension   . Hyperthyroidism   . Hypothyroidism   . Insomnia   . Osteopenia   . Personal history of radiation therapy 2018   right breast cancer    Past Surgical History:  Procedure Laterality Date  . ABDOMINAL HYSTERECTOMY     oophorectomy  . APPENDECTOMY    . BREAST BIOPSY Left    neg core  . BREAST BIOPSY Right    neg  core  . BREAST BIOPSY Right 11/2016   invasive mammary carcinoma, [place]  . BREAST BIOPSY Right 11/2016   benign, stero  . BREAST LUMPECTOMY Right 12/2016   invasive mammary carcinoma  Negative margins  . CATARACT EXTRACTION W/ INTRAOCULAR LENS IMPLANT    . CHOLECYSTECTOMY    . COLONOSCOPY    . COLONOSCOPY WITH PROPOFOL N/A 01/15/2018   Procedure: COLONOSCOPY WITH PROPOFOL;  Surgeon: Toledo, Benay Pike, MD;  Location: [facility] ENDOSCOPY;  Service: Gastroenterology;  Laterality: N/A;  . EYE SURGERY     bilateral cataracts  . OOPHORECTOMY    . PARTIAL MASTECTOMY WITH NEEDLE LOCALIZATION Right 01/04/2017   Procedure: PARTIAL MASTECTOMY WITH NEEDLE LOCALIZATION;  Surgeon:  Leonie Green, MD;  Location: [facility] ORS;  Service: General;  Laterality: Right;  . SENTINEL NODE BIOPSY Right 01/04/2017   Procedure: SENTINEL NODE BIOPSY;  Surgeon: Leonie Green, MD;  Location: [facility] ORS;  Service: General;  Laterality: Right;  . THYROID SURGERY     total thyroidectomy  . TONSILLECTOMY    . TOTAL THYROIDECTOMY      There were no vitals filed for this visit.  Subjective Assessment - 01/16/19 1105    Subjective   Patient reports she was coming in from getting the mail and she felt wobbly, went into the closet to get something and collapsed, she called her son on the phone.  Called EMS but son brought her. She was admitted to the hospital then IP rehab and now back home again.    Pertinent History  Patient 78 year old white female with a known history of diabetes,  dyslipidemia and hypothyroidism, presented to the emergency room with onset of slurred speech that started at 4 PM on 12/09/2018, with right facial droop and mild weakness on the right side.  Patient was evaluated and further work-up revealed her to have acute left internal capsule stroke.  Echocardiogram showed no evidence of cardioembolic cause.  CTA of the neck showed no large vessel occlusion.  Atherosclerotic disease was noted.    Patient Stated Goals  Patient reports she wants to walk, drive, and be independent again.    Currently in Pain?  No/denies    Multiple Pain Sites  No        [facility] OT Assessment - 01/16/19 1111      Assessment   Medical Diagnosis  CVA    Onset Date/Surgical Date  12/09/18    Hand Dominance  Right    Prior Therapy  yes IP rehab      Restrictions   Weight Bearing Restrictions  No      Balance Screen   Has the patient fallen in the past 6 months  Yes    How many times?  1    Has the patient had a decrease in activity level because of a fear of falling?   No    Is the patient reluctant to leave their home because of a fear of falling?   No      Home  Environment    Family/patient expects to be discharged to:  Private residence    Living Arrangements  Alone    Available Help at Discharge  Family    Type of [place]  One level    Bathroom Shower/Tub  [place];Door    [employer]  Handicapped height    Bathroom Accessibility  Yes    How accessible  Accessible via Web designer    Home Equipment  Bedside commode;Walker - 2 wheels;Shower seat - built in    Lives With  Alone      Prior Function   Level of Independence  Independent    Vocation  Retired    Leisure  shopping, Engineering geologist, plays on computer      ADL   Eating/Feeding  Modified independent    Grooming  Modified independent    Upper Body Bathing  Modified independent    Lower Body Bathing  Increased time    Upper Body Dressing  Increased time;Needs assist for fasteners    Lower Body Dressing  Increased time;Needs assist for fasteners    Toilet Transfer  Modified independent    Toileting - Clothing Manipulation  Modified independent    [place]    Tub/Shower Transfer  Other (comment)    ADL comments  Patient has difficulty with applying makeup, taking a shower, picking up and managing coins/change, handwriting, utensil use with secure grasp, managing bra.  Has not done any cooking with stove or oven since being home.  Can use microwave and toaster oven.       IADL   Prior Level of Function Shopping  independent    Shopping  Needs to be accompanied on any shopping trip    Prior Level of Function Light Housekeeping  independent    Light Housekeeping  Needs help with all home maintenance tasks    Prior Level of Function Meal Prep  independent    Meal Prep  Able to complete simple warm meal prep    Prior Level of Function Community Mobility  independent    Community Mobility  Relies on family or friends for transportation    Prior Level of Function Medication Managment  independent     Medication Management  Is responsible for taking medication in correct dosages at correct time    Prior Level of  Function Financial Management  independent    Financial Management  Requires supervision/minimal cuing      Mobility   Mobility Status  Needs assist      Written Expression   Dominant Hand  Right      Vision - History   Baseline Vision  Wears glasses only for reading    Visual History  Color blindness    Additional Comments  Reports some visual changes with the CVA      Sensation   Light Touch  Appears Intact    Stereognosis  Appears Intact    Hot/Cold  Appears Intact    Proprioception  Appears Intact    Additional Comments  sharp/dull intact      Coordination   Fine Motor Movements are Fluid and Coordinated  No    Finger Nose Finger Test  impaired    Right 9 Hole Peg Test  1 min 37 sec    Left 9 Hole Peg Test  32 secs    Other  RAM impaired      ROM / Strength   AROM / PROM / Strength  AROM;Strength      AROM   Overall AROM Comments  Right UE shoulder flexion 120 degrees, left 140 degrees       Strength   Overall Strength  Deficits    Overall Strength Comments  RUE 4-/5, left 4/5 overall      Hand Function   Right Hand Grip (lbs)  25    Right Hand Lateral Pinch  12 lbs    Right Hand 3 Point Pinch  5 lbs    Left Hand Grip (lbs)  40    Left Hand Lateral Pinch  12 lbs    Left 3 point pinch  11 lbs    Comment  2 point pinch right 5, left 8#                      OT Education - 01/16/19 1104    Education Details  goals, POC, OT role    Person(s) Educated  Patient    Methods  Explanation    Comprehension  Verbalized understanding          OT Long Term Goals - 01/16/19 1447      OT LONG TERM GOAL #1   Title  Patient will demonstrate modified independence in all basic self care tasks including bathing in the shower.    Baseline  pt able to complete sponge bath but not yet bathing in shower.    Time  6    Period  Weeks    Status   New    Target Date  02/27/19      OT LONG TERM GOAL #2   Title  Patient will be independent in home exercise program for strength and coordination.    Baseline  no current program    Time  12    Period  Weeks    Status  New    Target Date  04/10/19      OT LONG TERM GOAL #3   Title  Patient will complete light homemaking skills with modified independence.    Baseline  assist with cleaning, laundry at eval    Time  12    Period  Weeks    Status  New    Target Date  04/10/19      OT LONG TERM GOAL #4   Title  Patient will demonstrate the abiity  to hold a pen with secure tripod grasp and write her name with 75% legibility.    Baseline  difficulty with holding pen and forming letters of her name.    Time  6    Period  Weeks    Status  New    Target Date  04/10/19      OT LONG TERM GOAL #5   Title  Patient will demonstrate the ability to manage her finances online and written checks with modified independence.    Baseline  son is helping at eval.    Time  12    Period  Weeks    Status  New    Target Date  04/10/19      Long Term Additional Goals   Additional Long Term Goals  Yes      OT LONG TERM GOAL #6   Title  Patient will improve right fine motor coordination to apply her makeup with modified independence.    Baseline  unable at eval    Time  12    Period  Weeks    Status  New    Target Date  04/10/19      OT LONG TERM GOAL #7   Title  Patient will improve right hand function to operate the remote control for the TV.    Baseline  unable at eval    Time  12    Period  Weeks    Status  New    Target Date  04/10/19            Plan - 01/16/19 1437    Clinical Impression Statement  Patient is a 78 yo female diagnosed with left internal capsule CVA, she completed IP rehab and [facility] and is now referred to OP OT for evaluation.  Patient presents with right sided muscle weakness, lack of coordination, decreased balance, functional mobility and decreased ability  to perform self care and IADL tasks.  She lives alone but has good family support.  She would benefit from skilled OT services to maximize safety and independence in necessary daily tasks.    OT Occupational Profile and History  Detailed Assessment- Review of Records and additional review of physical, cognitive, psychosocial history related to current functional performance    Occupational performance deficits (Please refer to evaluation for details):  ADL's;IADL's;Social Participation;Leisure    Body Structure / Function / Physical Skills  ADL;Coordination;UE functional use;Balance;Decreased knowledge of use of DME;IADL;Dexterity;FMC;Strength    Psychosocial Skills  Coping Strategies;Environmental  Adaptations;Routines and Behaviors;Habits    Rehab Potential  Good    Clinical Decision Making  Limited treatment options, no task modification necessary    Comorbidities Affecting Occupational Performance:  May have comorbidities impacting occupational performance    Modification or Assistance to Complete Evaluation   No modification of tasks or assist necessary to complete eval    OT Frequency  2x / week    OT Duration  12 weeks    OT Treatment/Interventions  Self-care/ADL training;Cryotherapy;Therapeutic exercise;DME and/or AE instruction;Functional Mobility Training;Balance training;Neuromuscular education;Manual Therapy;Moist Heat;Therapeutic activities;Patient/family education    Consulted and Agree with Plan of Care  Patient       Patient will benefit from skilled therapeutic intervention in order to improve the following deficits and impairments:   Body Structure / Function / Physical Skills: ADL, Coordination, UE functional use, Balance, Decreased knowledge of use of DME, IADL, Dexterity, FMC, Strength   Psychosocial Skills: Coping Strategies, Environmental  Adaptations, Routines and Behaviors, Habits  Visit Diagnosis: Other lack of coordination  Muscle weakness  (generalized)  Hemiplegia and hemiparesis following cerebral infarction affecting right dominant side [facility])    Problem List Patient Active Problem List   Diagnosis Date Noted  . TIA (transient ischemic attack) 12/10/2018  . Primary cancer of upper inner quadrant of right female breast ([place]) 12/25/2016   Meghan Dougherty, Meghan Dougherty, Meghan Dougherty  Meghan Dougherty 01/16/2019, 2:56 PM  [place] MAIN [facility] SERVICES [address] [place], [place], [address] Phone: [phone]   Fax:  [phone]  Name: Meghan Dougherty MRN: [id number] Date of Birth: 09-22-1940

## 2019-01-16 NOTE — Therapy (Signed)
[place] MAIN [facility] SERVICES [address] [place], [place], [address] Phone: [phone]   Fax:  [phone]  Speech Language Pathology Evaluation  Patient Details  Name: Meghan Dougherty MRN: [id number] Date of Birth: 03/18/1941 Referring Provider (SLP): Jari Pigg   Encounter Date: 01/16/2019  End of Session - 01/16/19 1236    Visit Number  1    Number of Visits  16    Date for SLP Re-Evaluation  03/13/19    SLP Start Time  1000    SLP Stop Time   1055    SLP Time Calculation (min)  55 min    Activity Tolerance  Patient tolerated treatment well       Past Medical History:  Diagnosis Date  . Borderline diabetes   . Breast cancer, right ([place]) 11/2016   invasive mammary carcinoma, Lumpectomy and rad tx's.   . Colon polyp   . Diabetes mellitus without complication ([employer])    type 2  . Hyperlipemia   . Hypertension   . Hyperthyroidism   . Hypothyroidism   . Insomnia   . Osteopenia   . Personal history of radiation therapy 2018   right breast cancer    Past Surgical History:  Procedure Laterality Date  . ABDOMINAL HYSTERECTOMY     oophorectomy  . APPENDECTOMY    . BREAST BIOPSY Left    neg core  . BREAST BIOPSY Right    neg  core  . BREAST BIOPSY Right 11/2016   invasive mammary carcinoma, [place]  . BREAST BIOPSY Right 11/2016   benign, stero  . BREAST LUMPECTOMY Right 12/2016   invasive mammary carcinoma  Negative margins  . CATARACT EXTRACTION W/ INTRAOCULAR LENS IMPLANT    . CHOLECYSTECTOMY    . COLONOSCOPY    . COLONOSCOPY WITH PROPOFOL N/A 01/15/2018   Procedure: COLONOSCOPY WITH PROPOFOL;  Surgeon: Toledo, Benay Pike, MD;  Location: [facility] ENDOSCOPY;  Service: Gastroenterology;  Laterality: N/A;  . EYE SURGERY     bilateral cataracts  . OOPHORECTOMY    . PARTIAL MASTECTOMY WITH NEEDLE LOCALIZATION Right 01/04/2017   Procedure: PARTIAL MASTECTOMY WITH NEEDLE LOCALIZATION;  Surgeon: Leonie Green, MD;  Location:  [facility] ORS;  Service: General;  Laterality: Right;  . SENTINEL NODE BIOPSY Right 01/04/2017   Procedure: SENTINEL NODE BIOPSY;  Surgeon: Leonie Green, MD;  Location: [facility] ORS;  Service: General;  Laterality: Right;  . THYROID SURGERY     total thyroidectomy  . TONSILLECTOMY    . TOTAL THYROIDECTOMY      There were no vitals filed for this visit.  Subjective Assessment - 01/16/19 1226    Subjective  Pt pleasant and cooperative during evaluation    Patient is accompained by:  Family member   son, [name]   Currently in Pain?  No/denies         SLP Evaluation [facility] - 01/16/19 1226      SLP Visit Information   SLP Received On  01/16/19    Referring Provider (SLP)  Mamie Nick O'Brien    Onset Date  01/12/2019    Medical Diagnosis  CVA      Subjective   Subjective  Pt pleasant and cooperative with therapist during evaluation    Patient/Family Stated Goal  improvement in word retrieval      General Information   HPI  78 year old female referred for outpatient speech therapy after CVA in September 2020. 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 MBS  was completed during acute hospitalization, with recommendation for Dys 2 solids and honey thick liquids.    Behavioral/Cognition  WFL    Mobility Status  ambulates with walker      Prior Functional Status   Cognitive/Linguistic Baseline  Within functional limits    Type of Home  House     Lives With  --   son has been staying with pt since dc from rehab   Available Support  Family;Friend(s);Neighbor    Vocation  Retired      Charity fundraiser Status  Within [employer] for tasks assessed      Auditory Comprehension   Overall Auditory Comprehension  Impaired    Yes/No Questions  Within Functional Limits    Commands  Impaired    One Step Basic Commands  75-100% accurate    Two Step Basic Commands  75-100% accurate    Multistep Basic Commands  50-74% accurate    Complex Commands  50-74% accurate     Conversation  Simple      Reading Comprehension   Reading Status  Within funtional limits      Expression   Primary Mode of Expression  Verbal      Verbal Expression   Overall Verbal Expression  Impaired    Initiation  No impairment    Automatic Speech  Name;Social Response;Counting;Day of week;Month of year    Level of Generative/Spontaneous Verbalization  Word;Phrase;Sentence;Conversation    Repetition  No impairment    Naming  Impairment    Responsive  76-100% accurate    Confrontation  75-100% accurate    Convergent  50-74% accurate    Divergent  50-74% accurate    Pragmatics  No impairment      Written Expression   Dominant Hand  Right    Written Expression  Exceptions to [facility]   reduced strength and coordination in right hand     Oral Motor/Sensory Function   Overall Oral Motor/Sensory Function  Appears within functional limits for tasks assessed      Motor Speech   Overall Motor Speech  Appears within functional limits for tasks assessed      Standardized Assessments   Standardized Assessments   Western Aphasia Battery                      SLP Education - 01/16/19 1235    Education Details  SLP discussed high level word retrieval issues that will be addressed in therapy    Person(s) Educated  Patient;Child(ren)    Methods  Explanation;Demonstration    Comprehension  Verbalized understanding;Need further instruction         SLP Long Term Goals - 01/16/19 1246      SLP LONG TERM GOAL #1   Title  Pt will complete word retrieval tasks of increasing complexity with 80% accuracy given min assist    Time  8    Period  Weeks    Status  New    Target Date  03/13/19      SLP LONG TERM GOAL #2   Title  Pt will verbalize awareness of and adherence to safe swallow strategies to maximize safe po intake.    Time  8    Period  Weeks    Status  New    Target Date  03/13/19       Plan - 01/16/19 1237    Clinical Impression Statement APHASIA:   Portions of the Western Aphasia Battery (  WAB) were administered. Yes/No responses, word recognition, 1-2 step commands, repetition, sentence completion and basic responsive naming tasks were [facility]. Mild difficulty noted with more complex verbal direction following and word fluency task (naming category members). Informal tasks of describing words or homonyms were significantly more difficult for pt, with breakdown of abilities quite evident. Pt reports she has improved, but still "knows what (she) wants to say but can't say it - can't get the words out".   DYSPHAGIA:  Pt reports she is no longer on thickened liquids, having been advanced to regular/thin during inpatient rehab stay. Pt and son indicate she has difficulty adhereing to safe swallow precautions (minimize distractions, clear mouth before taking next bite/sip, small bites and sips at slow rate), and requires cues to remember them. Pt reports she must be careful or she will get choked.    Speech Therapy Frequency  2x / week    Duration  Other (comment)    Treatment/Interventions  Aspiration precaution training;Pharyngeal strengthening exercises;Diet toleration management by SLP;Internal/external aids;Cueing hierarchy;Compensatory techniques;Language facilitation;Functional tasks;SLP instruction and feedback;Patient/family education;Compensatory strategies;Multimodal communcation approach    Potential to Achieve Goals  Good    Potential Considerations  Ability to learn/carryover information;Cooperation/participation level;Previous level of function;Family/community support    Consulted and Agree with Plan of Care  Patient;Family member/caregiver    Family Member Consulted  son [name]       Patient will benefit from skilled therapeutic intervention in order to improve the following deficits and impairments:   Aphasia - Plan: SLP plan of care cert/re-cert    Problem List Patient Active Problem List   Diagnosis Date Noted  . TIA  (transient ischemic attack) 12/10/2018  . Primary cancer of upper inner quadrant of right female breast ([place]) 12/25/2016   Meghan Dougherty B. Quentin Ore [facility] [place] Speech Language Pathologist Office: [phone]  Shonna Chock 01/16/2019, 12:51 PM  [place] MAIN [facility] SERVICES [address] [place], [place], [address] Phone: [phone]   Fax:  [phone]  Name: Meghan Dougherty MRN: [id number] Date of Birth: May 10, 1940

## 2019-01-19 ENCOUNTER — Ambulatory Visit: Payer: Medicare Other

## 2019-01-19 ENCOUNTER — Other Ambulatory Visit: Payer: Self-pay

## 2019-01-19 DIAGNOSIS — M6281 Muscle weakness (generalized): Secondary | ICD-10-CM

## 2019-01-19 DIAGNOSIS — R2689 Other abnormalities of gait and mobility: Secondary | ICD-10-CM

## 2019-01-19 DIAGNOSIS — R2681 Unsteadiness on feet: Secondary | ICD-10-CM

## 2019-01-19 DIAGNOSIS — R4701 Aphasia: Secondary | ICD-10-CM | POA: Diagnosis not present

## 2019-01-19 NOTE — Patient Instructions (Signed)
   Access Code: [email or web address]  URL: [email or web address]  Date: 01/19/2019  Prepared by: Janna Arch   Exercises Standing March with Counter Support - 10 reps - 2 sets - 5 hold - 1x daily - 7x weekly Seated Ankle Alphabet - 2 reps - 2 sets - 5 hold - 1x daily - 7x weekly Seated Heel Toe Raises - 10 reps - 2 sets - 5 hold - 1x daily - 7x weekly Sit to Stand - 10 reps - 2 sets - 5 hold - 1x daily - 7x weekly

## 2019-01-19 NOTE — Therapy (Signed)
[place] MAIN [facility] SERVICES [address] [place], [place], [address] Phone: [phone]   Fax:  [phone]  Physical Therapy Evaluation  Patient Details  Name: Meghan Dougherty MRN: [id number] Date of Birth: Aug 13, 1940 Referring Provider (PT): Doran Clay   Encounter Date: 01/19/2019  PT End of Session - 01/19/19 1250    Visit Number  1    Number of Visits  16    Date for PT Re-Evaluation  03/16/19    Authorization Type  1/10 eval 01/19/19    PT Start Time  0802    PT Stop Time  0858    PT Time Calculation (min)  56 min    Equipment Utilized During Treatment  Gait belt    Activity Tolerance  Patient tolerated treatment well;Patient limited by fatigue    Behavior During Therapy  [facility] for tasks assessed/performed;Anxious;Flat affect       Past Medical History:  Diagnosis Date  . Borderline diabetes   . Breast cancer, right ([place]) 11/2016   invasive mammary carcinoma, Lumpectomy and rad tx's.   . Colon polyp   . Diabetes mellitus without complication ([place])    type 2  . Hyperlipemia   . Hypertension   . Hyperthyroidism   . Hypothyroidism   . Insomnia   . Osteopenia   . Personal history of radiation therapy 2018   right breast cancer    Past Surgical History:  Procedure Laterality Date  . ABDOMINAL HYSTERECTOMY     oophorectomy  . APPENDECTOMY    . BREAST BIOPSY Left    neg core  . BREAST BIOPSY Right    neg  core  . BREAST BIOPSY Right 11/2016   invasive mammary carcinoma, [place]  . BREAST BIOPSY Right 11/2016   benign, stero  . BREAST LUMPECTOMY Right 12/2016   invasive mammary carcinoma  Negative margins  . CATARACT EXTRACTION W/ INTRAOCULAR LENS IMPLANT    . CHOLECYSTECTOMY    . COLONOSCOPY    . COLONOSCOPY WITH PROPOFOL N/A 01/15/2018   Procedure: COLONOSCOPY WITH PROPOFOL;  Surgeon: Toledo, Benay Pike, MD;  Location: [facility] ENDOSCOPY;  Service: Gastroenterology;  Laterality: N/A;  . EYE SURGERY     bilateral  cataracts  . OOPHORECTOMY    . PARTIAL MASTECTOMY WITH NEEDLE LOCALIZATION Right 01/04/2017   Procedure: PARTIAL MASTECTOMY WITH NEEDLE LOCALIZATION;  Surgeon: Leonie Green, MD;  Location: [facility] ORS;  Service: General;  Laterality: Right;  . SENTINEL NODE BIOPSY Right 01/04/2017   Procedure: SENTINEL NODE BIOPSY;  Surgeon: Leonie Green, MD;  Location: [facility] ORS;  Service: General;  Laterality: Right;  . THYROID SURGERY     total thyroidectomy  . TONSILLECTOMY    . TOTAL THYROIDECTOMY      There were no vitals filed for this visit.   Subjective Assessment - 01/19/19 0809    Subjective  Patient is a pleasant 78 year old female who presents for CVA.    Patient is accompained by:  --   friend   Pertinent History  Patient admitted to the hospital on 12/10/18 due to having slurred speech and then a fall when her R knee gave out. She was noted to have R facial droop, R sided weakness, expressive aphasia, and dysarthria. 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 PMH includes R breast CA with history of radiation and partial mastectomy (2018), DM type II, HTN, thyroid surgery, HLD, hypothyroidism. While in the hospital  was able to walk with walker. Patient was then d/c on 12/19/18 to [facility] rehab where she was discharged 01/07/19    How long can you sit comfortably?  all day    How long can you stand comfortably?  unsteady as soon as standing, reports about an hour    How long can you walk comfortably?  needs walker    Patient Stated Goals  to walk without a walker    Currently in Pain?  No/denies         [facility] PT Assessment - 01/19/19 0001      Assessment   Medical Diagnosis  CVA    Referring Provider (PT)  Angelita Ingles Brien    Onset Date/Surgical Date  12/09/18    Hand Dominance  Right    Prior Therapy  yes IP rehab      Precautions   Precautions  Fall      Restrictions   Weight Bearing Restrictions   No      Balance Screen   Has the patient fallen in the past 6 months  Yes    How many times?  1    Has the patient had a decrease in activity level because of a fear of falling?   Yes    Is the patient reluctant to leave their home because of a fear of falling?   Yes      [place] residence    Living Arrangements  Alone   son left saturday    Available Help at Discharge  Friend(s)    Type of [place] to enter    Entrance Stairs-Number of Steps  1    [place]  One level    [place] - 2 wheels;Shower seat      Prior Function   Level of Independence  Independent    Vocation  Retired    Leisure  shopping, Engineering geologist, playing on a computer.       Cognition   Overall Cognitive Status  Within Functional Limits for tasks assessed      Standardized Balance Assessment   Standardized Balance Assessment  Berg Balance Test      Berg Balance Test   Sit to Stand  Able to stand without using hands and stabilize independently    Standing Unsupported  Able to stand 2 minutes with supervision    Sitting with Back Unsupported but Feet Supported on Floor or Stool  Able to sit safely and securely 2 minutes    Stand to Sit  Sits safely with minimal use of hands    Transfers  Able to transfer safely, definite need of hands    Standing Unsupported with Eyes Closed  Able to stand 10 seconds with supervision    Standing Unsupported with Feet Together  Able to place feet together independently and stand for 1 minute with supervision    From Standing, Reach Forward with Outstretched Arm  Can reach forward >5 cm safely (2")    From Standing Position, Pick up Object from Floor  Able to pick up shoe, needs supervision    From Standing Position, Turn to Look Behind Over each Shoulder  Looks behind one side only/other side shows less weight shift    Turn 360 Degrees  Able to turn 360 degrees safely but  slowly    Standing Unsupported, Alternately  Place Feet on Step/Stool  Able to complete 4 steps without aid or supervision    Standing Unsupported, One Foot in Front  Able to take small step independently and hold 30 seconds    Standing on One Leg  Tries to lift leg/unable to hold 3 seconds but remains standing independently    Total Score  39       PAIN: No pain Has had history of cortisone shots in L knee  POSTURE: Standing: weight shift onto LLE, slight trunk flexion   STRENGTH:  Graded on a 0-5 scale Muscle Group Left Right  Hip Flex 4/5 4-/5  Hip Abd 4-/5 3+/5  Hip Add 3+/5 3+/5  Hip Ext 4-/5 3/5  Hip IR/ER    Knee Flex 4-/5* 3+/5  Knee Ext 4-/5* 3+/5  Ankle DF 4-/5 3/5  Ankle PF 4-/5 3/5  * pain  SENSATION: Says she does not feel a difference   SPECIAL TESTS: Coordination: dysmetria of RLE  FUNCTIONAL MOBILITY: STS: able to perform two sit to stands without UE support however quickly returns to perform with UE support; weight shift onto LLE  BALANCE: Dynamic Sitting Balance  Normal Able to sit unsupported and weight shift across midline maximally   Good Able to sit unsupported and weight shift across midline moderately   Good-/Fair+ Able to sit unsupported and weight shift across midline minimally   Fair Minimal weight shifting ipsilateral/front, difficulty crossing midline x  Fair- Reach to ipsilateral side and unable to weight shift   Poor + Able to sit unsupported with min A and reach to ipsilateral side, unable to weight shift   Poor Able to sit unsupported with mod A and reach ipsilateral/front-can't cross midline     Standing Dynamic Balance  Normal Stand independently unsupported, able to weight shift and cross midline maximally   Good Stand independently unsupported, able to weight shift and cross midline moderately   Good-/Fair+ Stand independently unsupported, able to weight shift across midline minimally   Fair Stand independently unsupported,  weight shift, and reach ipsilaterally, loss of balance when crossing midline x  Poor+ Able to stand with Min A and reach ipsilaterally, unable to weight shift   Poor Able to stand with Mod A and minimally reach ipsilaterally, unable to cross midline.     Static Sitting Balance  Normal Able to maintain balance against maximal resistance   Good Able to maintain balance against moderate resistance   Good-/Fair+ Accepts minimal resistance x  Fair Able to sit unsupported without balance loss and without UE support   Poor+ Able to maintain with Minimal assistance from individual or chair   Poor Unable to maintain balance-requires mod/max support from individual or chair     Static Standing Balance  Normal Able to maintain standing balance against maximal resistance   Good Able to maintain standing balance against moderate resistance   Good-/Fair+ Able to maintain standing balance against minimal resistance   Fair Able to stand unsupported without UE support and without LOB for 1-2 min x  Fair- Requires Min A and UE support to maintain standing without loss of balance   Poor+ Requires mod A and UE support to maintain standing without loss of balance   Poor Requires max A and UE support to maintain standing balance without loss      GAIT: Patient demonstrates difficulty with advancement of RLE with noted limitations of step length bilaterally. Has occasional episodes of R foot flat pattern of ambulation.   OUTCOME MEASURES: TEST  Outcome Interpretation  5 times sit<>stand 14 sec SUE for first two >52 yo, >15 sec indicates increased risk for falls  10 meter walk test          14 seconds no AD       m/s <1.0 m/s indicates increased risk for falls; limited community ambulator  ABC 42.5%       Berg Balance Assessment 39/56 <36/56 (100% risk for falls), 37-45 (80% risk for falls); 46-51 (>50% risk for falls); 52-55 (lower risk <25% of falls)            Access Code: [email or web address]  URL:  [email or web address]  Date: 01/19/2019  Prepared by: Janna Arch   Exercises Standing March with Counter Support - 10 reps - 2 sets - 5 hold - 1x daily - 7x weekly Seated Ankle Alphabet - 2 reps - 2 sets - 5 hold - 1x daily - 7x weekly Seated Heel Toe Raises - 10 reps - 2 sets - 5 hold - 1x daily - 7x weekly Sit to Stand - 10 reps - 2 sets - 5 hold - 1x daily - 7x weekly   Objective measurements completed on examination: See above findings.    .           PT Education - 01/19/19 1249    Education Details  HEP, goals, POC    Person(s) Educated  Patient    Methods  Explanation;Demonstration;Tactile cues;Verbal cues;Handout    Comprehension  Verbalized understanding;Returned demonstration;Verbal cues required;Tactile cues required       PT Short Term Goals - 01/19/19 1305      PT SHORT TERM GOAL #1   Title  Patient will be independent in home exercise program to improve strength/mobility for better functional independence with ADLs.    Baseline  10/12: HEP given    Time  2    Period  Weeks    Status  New    Target Date  02/02/19        PT Long Term Goals - 01/19/19 1306      PT LONG TERM GOAL #1   Title  Patient will increase Berg Balance score by > 6 points (45/56) to demonstrate decreased fall risk during functional activities.    Baseline  10/12: 39/56    Time  8    Period  Weeks    Status  New    Target Date  03/16/19      PT LONG TERM GOAL #2   Title  Patient will increase 10 meter walk test to >1.18m/s as to improve gait speed for better community ambulation and to reduce fall risk.    Baseline  10/12=0.71 m/s    Time  8    Period  Weeks    Status  New    Target Date  03/16/19      PT LONG TERM GOAL #3   Title  Patient will increase ABC scale score >80% to demonstrate better functional mobility and better confidence with ADLs.    Baseline  10/12: 42.5%    Time  8    Period  Weeks    Status  New    Target Date  03/16/19      PT  LONG TERM GOAL #4   Title  Patient will ambulate without an AD for > 500 ft for return to PLOF with improved stability with prolonged mobility.    Baseline  10/12: requires use of walker    Time  8  Period  Weeks    Status  New    Target Date  03/16/19             Plan - 01/19/19 1256    Clinical Impression Statement  Patient is a 77 year old female who presents with diffuse R sided coordination and weakness secondary to CVA. Patient demonstrates intermittent ability to perform transfers without UE support however has episodes of poor safety awareness. Patient presents at risk for falls scoring Berg at [phone] with poor coordination and ability to perform single limb stance. She is able to demonstrate short duration ambulation without AD with CGA however has limited foot clearance of RLE. Patient will benefit from skilled physical therapy for improved strength, mobility, and stability for decreased falls risk and improved independent mobility    Personal Factors and Comorbidities  Age;Comorbidity 3+;Fitness;Sex;Social Background;Past/Current Experience;Transportation    Comorbidities  R breast CA with history of radiation and partial mastectomy (2018), DM type II, HTN, thyroid surgery, HLD, hypothyroidism.    Examination-Activity Limitations  Bathing;Bend;Caring for Others;Dressing;Stairs;Squat;Locomotion Level;Stand;Lift;Transfers    Examination-Participation Restrictions  Cleaning;Church;Community Activity;Interpersonal Relationship;Laundry;Volunteer;Shop;Personal Finances;Meal Prep;Yard Work    Merchant [employer] officer  Evolving/Moderate complexity    Clinical Decision Making  Moderate    Rehab Potential  Fair    PT Frequency  2x / week    PT Duration  8 weeks    PT Treatment/Interventions  ADLs/Self Care Home Management;Aquatic Therapy;Cryotherapy;Electrical Stimulation;Canalith Repostioning;Biofeedback;Moist Heat;Traction;Ultrasound;DME Instruction;Gait training;Stair  training;Functional mobility training;Therapeutic activities;Patient/family education;Cognitive remediation;Neuromuscular re-education;Balance training;Therapeutic exercise;Orthotic Fit/Training;Manual techniques;Energy conservation;Passive range of motion;Taping;Vestibular    PT Next Visit Plan  weight shift/balance on RLE, foot clearance RLE    PT Home Exercise Plan  see above    Consulted and Agree with Plan of Care  Patient       Patient will benefit from skilled therapeutic intervention in order to improve the following deficits and impairments:  Abnormal gait, Cardiopulmonary status limiting activity, Decreased activity tolerance, Decreased balance, Decreased knowledge of precautions, Decreased endurance, Decreased coordination, Decreased cognition, Decreased knowledge of use of DME, Decreased mobility, Decreased safety awareness, Difficulty walking, Decreased strength, Impaired flexibility, Impaired perceived functional ability, Postural dysfunction, Improper body mechanics  Visit Diagnosis: Unsteadiness on feet  Muscle weakness (generalized)  Other abnormalities of gait and mobility     Problem List Patient Active Problem List   Diagnosis Date Noted  . TIA (transient ischemic attack) 12/10/2018  . Primary cancer of upper inner quadrant of right female breast ([place]) 12/25/2016   Janna Arch, PT, DPT   01/19/2019, 1:09 PM  [place] MAIN [facility] SERVICES [address] [place], [place], [address] Phone: [phone]   Fax:  [phone]  Name: Meghan Dougherty MRN: [id number] Date of Birth: 1941/01/03

## 2019-01-20 ENCOUNTER — Encounter: Payer: Self-pay | Admitting: Occupational Therapy

## 2019-01-20 ENCOUNTER — Other Ambulatory Visit: Payer: Self-pay

## 2019-01-20 ENCOUNTER — Ambulatory Visit: Payer: Medicare Other | Admitting: Occupational Therapy

## 2019-01-20 ENCOUNTER — Ambulatory Visit: Payer: Medicare Other | Admitting: Speech Pathology

## 2019-01-20 DIAGNOSIS — I69351 Hemiplegia and hemiparesis following cerebral infarction affecting right dominant side: Secondary | ICD-10-CM

## 2019-01-20 DIAGNOSIS — R278 Other lack of coordination: Secondary | ICD-10-CM

## 2019-01-20 DIAGNOSIS — R1313 Dysphagia, pharyngeal phase: Secondary | ICD-10-CM

## 2019-01-20 DIAGNOSIS — M6281 Muscle weakness (generalized): Secondary | ICD-10-CM

## 2019-01-20 DIAGNOSIS — R4701 Aphasia: Secondary | ICD-10-CM | POA: Diagnosis not present

## 2019-01-20 NOTE — Therapy (Signed)
[place] MAIN [facility] SERVICES [address] [place], [place], [address] Phone: [phone]   Fax:  [phone]  Occupational Therapy Treatment  Patient Details  Name: Meghan Dougherty MRN: [id number] Date of Birth: 08-10-1940 No data recorded  Encounter Date: 01/20/2019  OT End of Session - 01/20/19 1717    Visit Number  2    Number of Visits  24    Date for OT Re-Evaluation  04/10/19    OT Start Time  1515    OT Stop Time  1600    OT Time Calculation (min)  45 min    Activity Tolerance  Patient tolerated treatment well    Behavior During Therapy  [facility] for tasks assessed/performed;Anxious;Flat affect       Past Medical History:  Diagnosis Date  . Borderline diabetes   . Breast cancer, right ([place]) 11/2016   invasive mammary carcinoma, Lumpectomy and rad tx's.   . Colon polyp   . Diabetes mellitus without complication ([place])    type 2  . Hyperlipemia   . Hypertension   . Hyperthyroidism   . Hypothyroidism   . Insomnia   . Osteopenia   . Personal history of radiation therapy 2018   right breast cancer    Past Surgical History:  Procedure Laterality Date  . ABDOMINAL HYSTERECTOMY     oophorectomy  . APPENDECTOMY    . BREAST BIOPSY Left    neg core  . BREAST BIOPSY Right    neg  core  . BREAST BIOPSY Right 11/2016   invasive mammary carcinoma, [place]  . BREAST BIOPSY Right 11/2016   benign, stero  . BREAST LUMPECTOMY Right 12/2016   invasive mammary carcinoma  Negative margins  . CATARACT EXTRACTION W/ INTRAOCULAR LENS IMPLANT    . CHOLECYSTECTOMY    . COLONOSCOPY    . COLONOSCOPY WITH PROPOFOL N/A 01/15/2018   Procedure: COLONOSCOPY WITH PROPOFOL;  Surgeon: Toledo, Benay Pike, MD;  Location: [facility] ENDOSCOPY;  Service: Gastroenterology;  Laterality: N/A;  . EYE SURGERY     bilateral cataracts  . OOPHORECTOMY    . PARTIAL MASTECTOMY WITH NEEDLE LOCALIZATION Right 01/04/2017   Procedure: PARTIAL MASTECTOMY WITH NEEDLE  LOCALIZATION;  Surgeon: Leonie Green, MD;  Location: [facility] ORS;  Service: General;  Laterality: Right;  . SENTINEL NODE BIOPSY Right 01/04/2017   Procedure: SENTINEL NODE BIOPSY;  Surgeon: Leonie Green, MD;  Location: [facility] ORS;  Service: General;  Laterality: Right;  . THYROID SURGERY     total thyroidectomy  . TONSILLECTOMY    . TOTAL THYROIDECTOMY      There were no vitals filed for this visit.  Subjective Assessment - 01/20/19 1716    Subjective   Pt arrived with her son with her FWW.    Pertinent History  Patient 78 year old white female with a known history of diabetes,  dyslipidemia and hypothyroidism, presented to the emergency room with onset of slurred speech that started at 4 PM on 12/09/2018, with right facial droop and mild weakness on the right side.  Patient was evaluated and further work-up revealed her to have acute left internal capsule stroke.  Echocardiogram showed no evidence of cardioembolic cause.  CTA of the neck showed no large vessel occlusion.  Atherosclerotic disease was noted.    Patient Stated Goals  Patient reports she wants to walk, drive, and be independent again.    Currently in Pain?  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  She was able to grasp  a larger weighted pen using proper grasp but legibility was poor with the only letters that were readable were the "C and O". Discussed recommendation to continue to use her RUE and hand as much as possible for ADLs and IADLs such as light meal prep.     Neuromuscular re-ed:   Patient seen for strengthening and motor control movements in RUE and hand using gripper in 3rd slot with white spring (17.9#)to  Grasp and release a variety of pegs.  Mod cues for proper movement of R wrist extension and pronation and no substituion patterns In shoulder noted but mild increase in flexor tone.  3pt and 2pt pinch  with reaching worked on with R UE and hand with placement of  Graded clothespins onto long ruler at different positions and heights.  Pt stated her arm felt tired at end of session.                        OT Education - 01/20/19 1717    Education Details  [place] and strengthening    Person(s) Educated  Patient;Child(ren)    Methods  Explanation    Comprehension  Verbalized understanding;Returned demonstration;Need further instruction          OT Long Term Goals - 01/16/19 1447      OT LONG TERM GOAL #1   Title  Patient will demonstrate modified independence in all basic self care tasks including bathing in the shower.    Baseline  pt able to complete sponge bath but not yet bathing in shower.    Time  6    Period  Weeks    Status  New    Target Date  02/27/19      OT LONG TERM GOAL #2   Title  Patient will be independent in home exercise program for strength and coordination.    Baseline  no current program    Time  12    Period  Weeks    Status  New    Target Date  04/10/19      OT LONG TERM GOAL #3   Title  Patient will complete light homemaking skills with modified independence.    Baseline  assist with cleaning, laundry at eval    Time  12    Period  Weeks    Status  New    Target Date  04/10/19      OT LONG TERM GOAL #4   Title  Patient will demonstrate the abiity to hold a pen with secure tripod grasp and write her name with 75% legibility.    Baseline  difficulty with holding pen and forming letters of her name.    Time  6    Period  Weeks    Status  New    Target Date  04/10/19      OT LONG TERM GOAL #5   Title  Patient will demonstrate the ability to manage her finances online and written checks with modified independence.    Baseline  son is helping at eval.    Time  12    Period  Weeks    Status  New    Target Date  04/10/19      Long Term Additional Goals   Additional Long Term Goals  Yes      OT LONG  TERM GOAL #6   Title  Patient  will improve right fine motor coordination to apply her makeup with modified independence.    Baseline  unable at eval    Time  12    Period  Weeks    Status  New    Target Date  04/10/19      OT LONG TERM GOAL #7   Title  Patient will improve right hand function to operate the remote control for the TV.    Baseline  unable at eval    Time  12    Period  Weeks    Status  New    Target Date  04/10/19            Plan - 01/20/19 1717    Clinical Impression Statement  Pt arrived to session with her son [name] and [employer].  She was eager to work on increasing functional use of her R hand and arm since she was living independently before her CVA and hopes to return to this again.  She has mild increase in flexor tone in RUE and hand but did not interfere with functional fine motor training or reaching tasks using resistive gripper with white spring on level 3 (17.9#) to grasp and release various sizes of pegs.  She has decreased control of R wrist extension and pronation in addition to weakness and decreased dexterity.  She tried to use a larger weighted pen for handwriting but legibility was poor and only letters that were legible were "C and o".  Rec continued skilled OT for improving muscle strength and coordination in R dominant UE for use in ADLs and IADLS as well as balance and functional mobilty training to maximize independence and safety in daily tasks.    OT Occupational Profile and History  Problem Focused Assessment - Including review of records relating to presenting problem    Occupational performance deficits (Please refer to evaluation for details):  ADL's;IADL's;Social Participation;Leisure    Body Structure / Function / Physical Skills  ADL;Coordination;UE functional use;Balance;Decreased knowledge of use of DME;IADL;Dexterity;FMC;Strength    Psychosocial Skills  Coping Strategies;Environmental  Adaptations;Routines and Behaviors;Habits    Rehab Potential  Good    Clinical  Decision Making  Several treatment options, min-mod task modification necessary    Comorbidities Affecting Occupational Performance:  May have comorbidities impacting occupational performance    OT Frequency  2x / week    OT Duration  12 weeks    OT Treatment/Interventions  Self-care/ADL training;Cryotherapy;Therapeutic exercise;DME and/or AE instruction;Functional Mobility Training;Balance training;Neuromuscular education;Manual Therapy;Moist Heat;Therapeutic activities;Patient/family education    Consulted and Agree with Plan of Care  Patient       Patient will benefit from skilled therapeutic intervention in order to improve the following deficits and impairments:   Body Structure / Function / Physical Skills: ADL, Coordination, UE functional use, Balance, Decreased knowledge of use of DME, IADL, Dexterity, FMC, Strength   Psychosocial Skills: Coping Strategies, Environmental  Adaptations, Routines and Behaviors, Habits   Visit Diagnosis: Muscle weakness (generalized)  Other lack of coordination  Hemiplegia and hemiparesis following cerebral infarction affecting right dominant side [facility])    Problem List Patient Active Problem List   Diagnosis Date Noted  . TIA (transient ischemic attack) 12/10/2018  . Primary cancer of upper inner quadrant of right female breast ([place]) 12/25/2016    Chrys Racer, OTR/L, [place] [phone] 01/20/19, 5:31 PM  [place] MAIN [facility] SERVICES [place], [place],  [place] Phone: [phone]   Fax:  [phone]  Name: Meghan Dougherty MRN: [id number] Date of Birth: 09/17/1940

## 2019-01-21 ENCOUNTER — Encounter: Payer: Self-pay | Admitting: Speech Pathology

## 2019-01-21 NOTE — Therapy (Signed)
[place] MAIN [facility] SERVICES [address] [place], [place], [address] Phone: [phone]   Fax:  [phone]  Speech Language Pathology Treatment  Patient Details  Name: Meghan Dougherty MRN: [id number] Date of Birth: January 15, 1941 Referring Provider (SLP): Jari Pigg   Encounter Date: 01/20/2019  End of Session - 01/21/19 1211    Visit Number  2    Number of Visits  16    Date for SLP Re-Evaluation  03/13/19    Authorization Type  Medicare    Authorization Time Period  Start 01/16/2019    Authorization - Visit Number  2    Authorization - Number of Visits  10    SLP Start Time  1600    SLP Stop Time   1650    SLP Time Calculation (min)  50 min    Activity Tolerance  Patient tolerated treatment well;Patient limited by fatigue       Past Medical History:  Diagnosis Date  . Borderline diabetes   . Breast cancer, right ([place]) 11/2016   invasive mammary carcinoma, Lumpectomy and rad tx's.   . Colon polyp   . Diabetes mellitus without complication ([place])    type 2  . Hyperlipemia   . Hypertension   . Hyperthyroidism   . Hypothyroidism   . Insomnia   . Osteopenia   . Personal history of radiation therapy 2018   right breast cancer    Past Surgical History:  Procedure Laterality Date  . ABDOMINAL HYSTERECTOMY     oophorectomy  . APPENDECTOMY    . BREAST BIOPSY Left    neg core  . BREAST BIOPSY Right    neg  core  . BREAST BIOPSY Right 11/2016   invasive mammary carcinoma, [place]  . BREAST BIOPSY Right 11/2016   benign, stero  . BREAST LUMPECTOMY Right 12/2016   invasive mammary carcinoma  Negative margins  . CATARACT EXTRACTION W/ INTRAOCULAR LENS IMPLANT    . CHOLECYSTECTOMY    . COLONOSCOPY    . COLONOSCOPY WITH PROPOFOL N/A 01/15/2018   Procedure: COLONOSCOPY WITH PROPOFOL;  Surgeon: Toledo, Benay Pike, MD;  Location: [facility] ENDOSCOPY;  Service: Gastroenterology;  Laterality: N/A;  . EYE SURGERY     bilateral cataracts  .  OOPHORECTOMY    . PARTIAL MASTECTOMY WITH NEEDLE LOCALIZATION Right 01/04/2017   Procedure: PARTIAL MASTECTOMY WITH NEEDLE LOCALIZATION;  Surgeon: Leonie Green, MD;  Location: [facility] ORS;  Service: General;  Laterality: Right;  . SENTINEL NODE BIOPSY Right 01/04/2017   Procedure: SENTINEL NODE BIOPSY;  Surgeon: Leonie Green, MD;  Location: [facility] ORS;  Service: General;  Laterality: Right;  . THYROID SURGERY     total thyroidectomy  . TONSILLECTOMY    . TOTAL THYROIDECTOMY      There were no vitals filed for this visit.  Subjective Assessment - 01/21/19 1209    Subjective  The patient was alert, cooperative, and pleasant throughout the therapy session. She was accompanied by her son, Franchot Mimes.    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 MBS was completed during acute hospitalization, with recommendation for Dys 2 solids and honey thick liquids.  Treatment Provided   Treatment provided  Cognitive-Linquistic;Dysphagia      Dysphagia Treatment   Other treatment/comments  Recall of safe swallow strategies      Pain Assessment   Pain Assessment  No/denies pain      Cognitive-Linquistic Treatment   Treatment focused on  Aphasia    Skilled Treatment  DYSPHAGIA: Safe swallow precautions were reviewed with the patient and caregiver. Both reported that patient has ongoing difficulties with independent recall of strategies during food and liquid intake (becomes distracted by talking or watching television while she is eating). Patient agreeable to attempting use of visual aids in the home for facilitating recall to implement safe swallow precautions. WORD RETRIEVAL: Patient named members of categories  with 88% accuracy without interventions. Given semantic and phonemic cueing, accuracy increased to 94%. Patient completed verbal analogies aloud with 70% accuracy independently. Given semantic and phonemic cueing, accuracy improved to 100%.      Assessment / Recommendations / Plan   Plan  Continue with current plan of care      Progression Toward Goals   Progression toward goals  Progressing toward goals       SLP Education - 01/21/19 1209    Education Details  It's important to focus on one task at a time, especially when you're eating and drinking.    Person(s) Educated  Patient    Methods  Explanation    Comprehension  Verbalized understanding         SLP Long Term Goals - 01/16/19 1246      SLP LONG TERM GOAL #1   Title  Pt will complete word retrieval tasks of increasing complexity with 80% accuracy given min assist    Time  8    Period  Weeks    Status  New    Target Date  03/13/19      SLP LONG TERM GOAL #2   Title  Pt will verbalize awareness of and adherence to safe swallow strategies to maximize safe po intake.    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 She verbalizes understanding that divided attention is more difficult for her brain now following her stroke and indicates intention to transition to new habits of focusing on one task at a time. Visual aids will be provided next therapy session to facilitate recall of safe swallow strategies. Complexity of word retrieval tasks will be gradually increased.    Speech Therapy Frequency  2x / week    Duration  Other (comment)    Treatment/Interventions  Aspiration precaution training;Pharyngeal strengthening exercises;Diet toleration management by SLP;Internal/external aids;Cueing hierarchy;Compensatory techniques;Language facilitation;Functional tasks;SLP  instruction and feedback;Patient/family education;Compensatory strategies;Multimodal communcation approach    Potential to Achieve Goals  Good    Potential Considerations  Ability to learn/carryover information;Cooperation/participation level;Previous level of function;Family/community support    SLP Home Exercise Plan  Provided    Consulted and Agree with Plan of Care  Patient;Family member/caregiver    Family Member Consulted  son [name]       Patient will benefit from skilled therapeutic intervention in order to improve the following deficits and impairments:   Aphasia  Pharyngeal dysphagia    Problem List Patient Active Problem List   Diagnosis Date Noted  . TIA (transient  ischemic attack) 12/10/2018  . Primary cancer of upper inner quadrant of right female breast ([place]) 12/25/2016   Jordan Caraveo A. Francis Dowse., Graduate Clinician Vella Kohler 01/21/2019, 12:17 PM  [place] MAIN [facility] SERVICES [address] [place], [place], [address] Phone: [phone]   Fax:  [phone]   Name: Meghan Dougherty MRN: [id number] Date of Birth: [birth year]

## 2019-01-22 ENCOUNTER — Ambulatory Visit: Payer: Medicare Other | Admitting: Speech Pathology

## 2019-01-22 ENCOUNTER — Ambulatory Visit: Payer: Medicare Other | Admitting: Occupational Therapy

## 2019-01-26 ENCOUNTER — Encounter: Payer: Self-pay | Admitting: Speech Pathology

## 2019-01-26 ENCOUNTER — Ambulatory Visit: Payer: Medicare Other

## 2019-01-26 ENCOUNTER — Ambulatory Visit: Payer: Medicare Other | Admitting: Speech Pathology

## 2019-01-26 ENCOUNTER — Other Ambulatory Visit: Payer: Self-pay

## 2019-01-26 DIAGNOSIS — R1313 Dysphagia, pharyngeal phase: Secondary | ICD-10-CM

## 2019-01-26 DIAGNOSIS — M6281 Muscle weakness (generalized): Secondary | ICD-10-CM

## 2019-01-26 DIAGNOSIS — R2689 Other abnormalities of gait and mobility: Secondary | ICD-10-CM

## 2019-01-26 DIAGNOSIS — R4701 Aphasia: Secondary | ICD-10-CM

## 2019-01-26 DIAGNOSIS — R2681 Unsteadiness on feet: Secondary | ICD-10-CM

## 2019-01-26 NOTE — Therapy (Signed)
[place] MAIN [facility] SERVICES [address] [place], [place], [address] Phone: [phone]   Fax:  [phone]  Speech Language Pathology Treatment  Patient Details  Name: Meghan Dougherty MRN: [id number] Date of Birth: [birth year] Referring Provider (SLP): Jari Pigg   Encounter Date: 01/26/2019  End of Session - 01/26/19 1010    Visit Number  3    Number of Visits  16    Date for SLP Re-Evaluation  03/13/19    Authorization Type  Medicare    Authorization Time Period  Start 01/16/2019    Authorization - Visit Number  3    Authorization - Number of Visits  10    SLP Start Time  0900    SLP Stop Time   0950    SLP Time Calculation (min)  50 min    Activity Tolerance  Patient tolerated treatment well       Past Medical History:  Diagnosis Date  . Borderline diabetes   . Breast cancer, right ([place]) 11/2016   invasive mammary carcinoma, Lumpectomy and rad tx's.   . Colon polyp   . Diabetes mellitus without complication ([place])    type 2  . Hyperlipemia   . Hypertension   . Hyperthyroidism   . Hypothyroidism   . Insomnia   . Osteopenia   . Personal history of radiation therapy 2018   right breast cancer    Past Surgical History:  Procedure Laterality Date  . ABDOMINAL HYSTERECTOMY     oophorectomy  . APPENDECTOMY    . BREAST BIOPSY Left    neg core  . BREAST BIOPSY Right    neg  core  . BREAST BIOPSY Right 11/2016   invasive mammary carcinoma, [place]  . BREAST BIOPSY Right 11/2016   benign, stero  . BREAST LUMPECTOMY Right 12/2016   invasive mammary carcinoma  Negative margins  . CATARACT EXTRACTION W/ INTRAOCULAR LENS IMPLANT    . CHOLECYSTECTOMY    . COLONOSCOPY    . COLONOSCOPY WITH PROPOFOL N/A 01/15/2018   Procedure: COLONOSCOPY WITH PROPOFOL;  Surgeon: Toledo, Benay Pike, MD;  Location: [facility] ENDOSCOPY;  Service: Gastroenterology;  Laterality: N/A;  . EYE SURGERY     bilateral cataracts  . OOPHORECTOMY    . PARTIAL  MASTECTOMY WITH NEEDLE LOCALIZATION Right 01/04/2017   Procedure: PARTIAL MASTECTOMY WITH NEEDLE LOCALIZATION;  Surgeon: Leonie Green, MD;  Location: [facility] ORS;  Service: General;  Laterality: Right;  . SENTINEL NODE BIOPSY Right 01/04/2017   Procedure: SENTINEL NODE BIOPSY;  Surgeon: Leonie Green, MD;  Location: [facility] ORS;  Service: General;  Laterality: Right;  . THYROID SURGERY     total thyroidectomy  . TONSILLECTOMY    . TOTAL THYROIDECTOMY      There were no vitals filed for this visit.  Subjective Assessment - 01/26/19 1008    Subjective  The patient was alert, cooperative, and pleasant throughout the therapy session. She shared that her son, Franchot Mimes, returned to his home in [place], so she is living alone again.    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 MBS was completed during acute hospitalization, with recommendation for Dys 2  solids and honey thick liquids.      Treatment Provided   Treatment provided  Cognitive-Linquistic      Dysphagia Treatment   Other treatment/comments  Recall of safe swallow strategies      Pain Assessment   Pain Assessment  No/denies pain      Cognitive-Linquistic Treatment   Treatment focused on  Aphasia    Skilled Treatment  DYSPHAGIA: Visual aid listing safe swallow precautions was provided. Strategies and rationale were reviewed with patient. Patient reported continued difficulty remembering to implement the precautions during eating/drinking. She is accustomed to eating meals in the living room while watching television and does not usually drink any liquid with her food. Patient was encouraged to eat her meals with a drink at the kitchen table to  facilitate minimal distractions, attention to task, smaller bolus sizes, and alternation of solid and liquid intake. Patient verbalized understanding and agreed to place visual aid provided at her kitchen table. WORD RETRIEVAL: Patient added members to concrete categories with 84% accuracy without interventions. Given semantic/phonemic cueing, accuracy increased to 98%. Patient stated categories with 80% accuracy independently. Given semantic/phonemic cueing, accuracy improved to 69%. Patient noted with delayed response times and articulatory imprecision across therapy tasks.       Assessment / Recommendations / Plan   Plan  Continue with current plan of care      Progression Toward Goals   Progression toward goals  Progressing toward goals       SLP Education - 01/26/19 1008    Education Details  Since you watch television all day, try taking a break from it to eat your meals at the kitchen table.    Person(s) Educated  Patient    Methods  Explanation    Comprehension  Verbalized understanding         SLP Long Term Goals - 01/16/19 1246      SLP LONG TERM GOAL #1   Title  Pt will complete word retrieval tasks of increasing complexity with 80% accuracy given min assist    Time  8    Period  Weeks    Status  New    Target Date  03/13/19      SLP LONG TERM GOAL #2   Title  Pt will verbalize awareness of and adherence to safe swallow strategies to maximize safe po intake.    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 She verbalizes understanding that divided attention is more difficult for her brain now following her stroke and expresses reluctant willingness to transition to new habits of focusing on one task at a time. Visual aid provided for facilitating recall of safe swallow strategies provided today for use in patient's  home environment. Patient performing well on word retrieval tasks given minimal cueing and sufficient response time.    Speech Therapy Frequency  2x / week    Duration  Other (comment)    Treatment/Interventions  Aspiration precaution training;Pharyngeal strengthening exercises;Diet toleration management by SLP;Internal/external aids;Cueing hierarchy;Compensatory techniques;Language facilitation;Functional tasks;SLP instruction and feedback;Patient/family education;Compensatory strategies;Multimodal communcation approach    Potential to Achieve Goals  Good    Potential Considerations  Ability to learn/carryover information;Cooperation/participation level;Previous level of function;Family/community support    SLP Home Exercise Plan  Provided  Patient will benefit from skilled therapeutic intervention in order to improve the following deficits and impairments:   Aphasia  Pharyngeal dysphagia    Problem List Patient Active Problem List   Diagnosis Date Noted  . TIA (transient ischemic attack) 12/10/2018  . Primary cancer of upper inner quadrant of right female breast ([place]) 12/25/2016   Shaheer Bonfield A. Francis Dowse., Graduate Clinician Vella Kohler 01/26/2019, 10:12 AM  [place] MAIN [facility] SERVICES [address] [place], [place], [address] Phone: [phone]   Fax:  [phone]   Name: Meghan Dougherty MRN: [id number] Date of Birth: 11-11-1940

## 2019-01-26 NOTE — Therapy (Signed)
[place] MAIN [facility] SERVICES [address] [place], [place], [address] Phone: [phone]   Fax:  [phone]  Physical Therapy Treatment  Patient Details  Name: Meghan Dougherty MRN: [id number] Date of Birth: 1940/07/01 Referring Provider (PT): Doran Clay   Encounter Date: 01/26/2019  PT End of Session - 01/26/19 0819    Visit Number  2    Number of Visits  16    Date for PT Re-Evaluation  03/16/19    Authorization Type  2/10 eval 01/19/19    PT Start Time  0814    PT Stop Time  0845    PT Time Calculation (min)  31 min    Equipment Utilized During Treatment  Gait belt    Activity Tolerance  Patient tolerated treatment well;Patient limited by fatigue    Behavior During Therapy  [facility] for tasks assessed/performed;Anxious;Flat affect       Past Medical History:  Diagnosis Date  . Borderline diabetes   . Breast cancer, right ([place]) 11/2016   invasive mammary carcinoma, Lumpectomy and rad tx's.   . Colon polyp   . Diabetes mellitus without complication ([place])    type 2  . Hyperlipemia   . Hypertension   . Hyperthyroidism   . Hypothyroidism   . Insomnia   . Osteopenia   . Personal history of radiation therapy 2018   right breast cancer    Past Surgical History:  Procedure Laterality Date  . ABDOMINAL HYSTERECTOMY     oophorectomy  . APPENDECTOMY    . BREAST BIOPSY Left    neg core  . BREAST BIOPSY Right    neg  core  . BREAST BIOPSY Right 11/2016   invasive mammary carcinoma, [place]  . BREAST BIOPSY Right 11/2016   benign, stero  . BREAST LUMPECTOMY Right 12/2016   invasive mammary carcinoma  Negative margins  . CATARACT EXTRACTION W/ INTRAOCULAR LENS IMPLANT    . CHOLECYSTECTOMY    . COLONOSCOPY    . COLONOSCOPY WITH PROPOFOL N/A 01/15/2018   Procedure: COLONOSCOPY WITH PROPOFOL;  Surgeon: Toledo, Benay Pike, MD;  Location: [facility] ENDOSCOPY;  Service: Gastroenterology;  Laterality: N/A;  . EYE SURGERY     bilateral  cataracts  . OOPHORECTOMY    . PARTIAL MASTECTOMY WITH NEEDLE LOCALIZATION Right 01/04/2017   Procedure: PARTIAL MASTECTOMY WITH NEEDLE LOCALIZATION;  Surgeon: Leonie Green, MD;  Location: [facility] ORS;  Service: General;  Laterality: Right;  . SENTINEL NODE BIOPSY Right 01/04/2017   Procedure: SENTINEL NODE BIOPSY;  Surgeon: Leonie Green, MD;  Location: [facility] ORS;  Service: General;  Laterality: Right;  . THYROID SURGERY     total thyroidectomy  . TONSILLECTOMY    . TOTAL THYROIDECTOMY      There were no vitals filed for this visit.  Subjective Assessment - 01/26/19 0818    Subjective  Patient was late to session due to sleeping through her alarm and her ride being late. Reports compliance with HEP.    Patient is accompained by:  --   friend   Pertinent History  Patient admitted to the hospital on 12/10/18 due to having slurred speech and then a fall when her R knee gave out. She was noted to have R facial droop, R sided weakness, expressive aphasia, and dysarthria. 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 PMH includes R breast CA with history of radiation and partial mastectomy (2018), DM type II, HTN, thyroid  surgery, HLD, hypothyroidism. While in the hospital was able to walk with walker. Patient was then d/c on 12/19/18 to [place] [facility] where she was discharged 01/07/19    How long can you sit comfortably?  all day    How long can you stand comfortably?  unsteady as soon as standing, reports about an hour    How long can you walk comfortably?  needs walker    Patient Stated Goals  to walk without a walker    Currently in Pain?  No/denies       Nustep Lvl 2 RPM> 60 for cardiovascular challenge, 3 minutes  // bars:   airex pad:  -one foot on airex pad one foot on 6 " step : 2x 30 second holds  -airex pad: marching 10x each LE   Orange hurdle step over and back SUE support 10x each direction, more  challenged lifting LLE.   Speed ladder: one foot in each box for widened BOS with increased step length, SUE support   6" step toe taps SUE support 12x each LE, challenged with R placement  6" step lateral toe taps SUE support 12x each LE   Balloon taps inside/outside BOS without LOB 2 minutes, challenged with RUE   Pt educated throughout session about proper posture and technique with exercises. Improved exercise technique, movement at target joints, use of target muscles after min to mod verbal, visual, tactile cues  Patient's session limited by patient's late arrival. Patient has limited stability on R leg making it challenging to lift LLE. Her coordination with RLE is challenging and requires additional time and use of UE support for stability. Patient will benefit from skilled physical therapy for improved strength, mobility, and stability for decreased falls risk and improved independent mobility                     PT Education - 01/26/19 0819    Education Details  exercise techniques, body mechanics    Person(s) Educated  Patient    Methods  Explanation;Demonstration;Tactile cues;Verbal cues    Comprehension  Verbalized understanding;Returned demonstration;Verbal cues required;Tactile cues required       PT Short Term Goals - 01/19/19 1305      PT SHORT TERM GOAL #1   Title  Patient will be independent in home exercise program to improve strength/mobility for better functional independence with ADLs.    Baseline  10/12: HEP given    Time  2    Period  Weeks    Status  New    Target Date  02/02/19        PT Long Term Goals - 01/19/19 1306      PT LONG TERM GOAL #1   Title  Patient will increase Berg Balance score by > 6 points (45/56) to demonstrate decreased fall risk during functional activities.    Baseline  10/12: 39/56    Time  8    Period  Weeks    Status  New    Target Date  03/16/19      PT LONG TERM GOAL #2   Title  Patient will  increase 10 meter walk test to >1.59m/s as to improve gait speed for better community ambulation and to reduce fall risk.    Baseline  10/12=0.71 m/s    Time  8    Period  Weeks    Status  New    Target Date  03/16/19      PT LONG  TERM GOAL #3   Title  Patient will increase ABC scale score >80% to demonstrate better functional mobility and better confidence with ADLs.    Baseline  10/12: 42.5%    Time  8    Period  Weeks    Status  New    Target Date  03/16/19      PT LONG TERM GOAL #4   Title  Patient will ambulate without an AD for > 500 ft for return to PLOF with improved stability with prolonged mobility.    Baseline  10/12: requires use of walker    Time  8    Period  Weeks    Status  New    Target Date  03/16/19            Plan - 01/26/19 1300    Clinical Impression Statement  Patient's session limited by patient's late arrival. Patient has limited stability on R leg making it challenging to lift LLE. Her coordination with RLE is challenging and requires additional time and use of UE support for stability. Patient will benefit from skilled physical therapy for improved strength, mobility, and stability for decreased falls risk and improved independent mobility    Personal Factors and Comorbidities  Age;Comorbidity 3+;Fitness;Sex;Social Background;Past/Current Experience;Transportation    Comorbidities  R breast CA with history of radiation and partial mastectomy (2018), DM type II, HTN, thyroid surgery, HLD, hypothyroidism.    Examination-Activity Limitations  Bathing;Bend;Caring for Others;Dressing;Stairs;Squat;Locomotion Level;Stand;Lift;Transfers    Examination-Participation Restrictions  Cleaning;Church;Community Activity;Interpersonal Relationship;Laundry;Volunteer;Shop;Personal Finances;Meal Prep;Yard Work    [employer] officer  Evolving/Moderate complexity    Rehab Potential  Fair    PT Frequency  2x / week    PT Duration  8 weeks    PT  Treatment/Interventions  ADLs/Self Care Home Management;Aquatic Therapy;Cryotherapy;Electrical Stimulation;Canalith Repostioning;Biofeedback;Moist Heat;Traction;Ultrasound;DME Instruction;Gait training;Stair training;Functional mobility training;Therapeutic activities;Patient/family education;Cognitive remediation;Neuromuscular re-education;Balance training;Therapeutic exercise;Orthotic Fit/Training;Manual techniques;Energy conservation;Passive range of motion;Taping;Vestibular    PT Next Visit Plan  weight shift/balance on RLE, foot clearance RLE    PT Home Exercise Plan  see above    Consulted and Agree with Plan of Care  Patient       Patient will benefit from skilled therapeutic intervention in order to improve the following deficits and impairments:  Abnormal gait, Cardiopulmonary status limiting activity, Decreased activity tolerance, Decreased balance, Decreased knowledge of precautions, Decreased endurance, Decreased coordination, Decreased cognition, Decreased knowledge of use of DME, Decreased mobility, Decreased safety awareness, Difficulty walking, Decreased strength, Impaired flexibility, Impaired perceived functional ability, Postural dysfunction, Improper body mechanics  Visit Diagnosis: Muscle weakness (generalized)  Unsteadiness on feet  Other abnormalities of gait and mobility     Problem List Patient Active Problem List   Diagnosis Date Noted  . TIA (transient ischemic attack) 12/10/2018  . Primary cancer of upper inner quadrant of right female breast ([place]) 12/25/2016   Janna Arch, PT, DPT   01/26/2019, 1:01 PM  [place] MAIN [facility] SERVICES [address] [place], [place], [address] Phone: [phone]   Fax:  [phone]  Name: Taisia Robitaille MRN: [id number] Date of Birth: April 21, 1940

## 2019-01-29 ENCOUNTER — Ambulatory Visit: Payer: Medicare Other | Admitting: Physical Therapy

## 2019-01-29 ENCOUNTER — Encounter: Payer: Self-pay | Admitting: Physical Therapy

## 2019-01-29 ENCOUNTER — Ambulatory Visit: Payer: Medicare Other | Admitting: Occupational Therapy

## 2019-01-29 ENCOUNTER — Other Ambulatory Visit: Payer: Self-pay

## 2019-01-29 DIAGNOSIS — R4701 Aphasia: Secondary | ICD-10-CM | POA: Diagnosis not present

## 2019-01-29 DIAGNOSIS — M6281 Muscle weakness (generalized): Secondary | ICD-10-CM

## 2019-01-29 DIAGNOSIS — R278 Other lack of coordination: Secondary | ICD-10-CM

## 2019-01-29 DIAGNOSIS — R2689 Other abnormalities of gait and mobility: Secondary | ICD-10-CM

## 2019-01-29 DIAGNOSIS — R2681 Unsteadiness on feet: Secondary | ICD-10-CM

## 2019-01-29 DIAGNOSIS — I69351 Hemiplegia and hemiparesis following cerebral infarction affecting right dominant side: Secondary | ICD-10-CM

## 2019-01-29 NOTE — Therapy (Signed)
[place] MAIN [facility] SERVICES [address] [place], [place], [address] Phone: [phone]   Fax:  [phone]  Physical Therapy Treatment  Patient Details  Name: Meghan Dougherty MRN: [id number] Date of Birth: [birth year] Referring Provider (PT): Doran Clay   Encounter Date: 01/29/2019  PT End of Session - 01/29/19 1038    Visit Number  3    Number of Visits  16    Date for PT Re-Evaluation  03/16/19    Authorization Type  2/10 eval 01/19/19    PT Start Time  1100    PT Stop Time  1145    PT Time Calculation (min)  45 min    Equipment Utilized During Treatment  Gait belt    Activity Tolerance  Patient tolerated treatment well;Patient limited by fatigue    Behavior During Therapy  [facility] for tasks assessed/performed;Anxious;Flat affect       Past Medical History:  Diagnosis Date  . Borderline diabetes   . Breast cancer, right ([place]) 11/2016   invasive mammary carcinoma, Lumpectomy and rad tx's.   . Colon polyp   . Diabetes mellitus without complication ([place])    type 2  . Hyperlipemia   . Hypertension   . Hyperthyroidism   . Hypothyroidism   . Insomnia   . Osteopenia   . Personal history of radiation therapy 2018   right breast cancer    Past Surgical History:  Procedure Laterality Date  . ABDOMINAL HYSTERECTOMY     oophorectomy  . APPENDECTOMY    . BREAST BIOPSY Left    neg core  . BREAST BIOPSY Right    neg  core  . BREAST BIOPSY Right 11/2016   invasive mammary carcinoma, [place]  . BREAST BIOPSY Right 11/2016   benign, stero  . BREAST LUMPECTOMY Right 12/2016   invasive mammary carcinoma  Negative margins  . CATARACT EXTRACTION W/ INTRAOCULAR LENS IMPLANT    . CHOLECYSTECTOMY    . COLONOSCOPY    . COLONOSCOPY WITH PROPOFOL N/A 01/15/2018   Procedure: COLONOSCOPY WITH PROPOFOL;  Surgeon: Toledo, Benay Pike, MD;  Location: [facility] ENDOSCOPY;  Service: Gastroenterology;  Laterality: N/A;  . EYE SURGERY     bilateral  cataracts  . OOPHORECTOMY    . PARTIAL MASTECTOMY WITH NEEDLE LOCALIZATION Right 01/04/2017   Procedure: PARTIAL MASTECTOMY WITH NEEDLE LOCALIZATION;  Surgeon: Leonie Green, MD;  Location: [facility] ORS;  Service: General;  Laterality: Right;  . SENTINEL NODE BIOPSY Right 01/04/2017   Procedure: SENTINEL NODE BIOPSY;  Surgeon: Leonie Green, MD;  Location: [facility] ORS;  Service: General;  Laterality: Right;  . THYROID SURGERY     total thyroidectomy  . TONSILLECTOMY    . TOTAL THYROIDECTOMY      There were no vitals filed for this visit.  Subjective Assessment - 01/29/19 1055    Subjective  Patient reports that she is doing fine this morning, no reports of pain and no new concerns at this time.    Patient is accompained by:  --   friend   Pertinent History  Patient admitted to the hospital on 12/10/18 due to having slurred speech and then a fall when her R knee gave out. She was noted to have R facial droop, R sided weakness, expressive aphasia, and dysarthria. 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 PMH includes R breast CA with history of radiation and partial mastectomy (2018), DM type II, HTN, thyroid  surgery, HLD, hypothyroidism. While in the hospital was able to walk with walker. Patient was then d/c on 12/19/18 to [place] [facility] where she was discharged 01/07/19    How long can you sit comfortably?  all day    How long can you stand comfortably?  unsteady as soon as standing, reports about an hour    How long can you walk comfortably?  needs walker    Patient Stated Goals  to walk without a walker    Currently in Pain?  No/denies    Multiple Pain Sites  No         Neuro Re-ed: Patient requires CGA during all standing interventions due to limited stability and patient fear of LOB. Cueing for reduction of UE support required as well as postural alignment for optimal stability within  COM   Treatment:  NMRE:  -Standing marches on airex foam x20 reps; cues to lift knees high for increased SLS time  -Standing on airex foam feet together self-ball tosses 2x10  -Alternating toe taps to 6" step x20   -Alternating step ups to 6" step x10  -Fwd/bwd step over orange hurdle x10 each direction; occasional UE assist  -Side-step over orange hurdle x10 each direction; initially BUE rail assist, improved to no UE support with min VC and demonstration to perform step-over in quick sequence  -Fwd walk speed ladder x5 laps; cues for one foot each square; difficulty with taking big steps, utilizes occasional rail assist.   -Side-step speed ladder x5 laps; cues for one foot each square; More difficulty side-stepping to the right, occasional rail assist.   -Matrix walkouts 7.5# x1.5 laps fwd/bwd; patient became heavily fatigued and unable to lift RLE from ground to make steps.   -Balloon taps inside/outside BOS without LOB 3 minutes, challenged with RUE; becomes fatigued after 2 minutes   -STS 2x10 with arms crossed over shoulder; increased fatigue by 2nd set.   Response to Treatment: Patient demonstrates fair motivation throughout today's session. Patient was able to perform progressions to balance intervention including obstacle negotiation, required demonstrations, frequent UE support and intermittent rest breaks. Patient continues to be challenged by RLE weakness, general fatigue, and balancing on compliant surfaces, which improved with repetition, demonstration, and rest breaks. Patient would continue to benefit from skilled PT to address the deficits outlined in this note.      PT Education - 01/29/19 1038    Education Details  exercise technique, body mechanics    Person(s) Educated  Patient    Methods  Explanation;Demonstration;Tactile cues    Comprehension  Verbalized understanding;Returned demonstration;Verbal cues required;Tactile cues required       PT Short  Term Goals - 01/19/19 1305      PT SHORT TERM GOAL #1   Title  Patient will be independent in home exercise program to improve strength/mobility for better functional independence with ADLs.    Baseline  10/12: HEP given    Time  2    Period  Weeks    Status  New    Target Date  02/02/19        PT Long Term Goals - 01/19/19 1306      PT LONG TERM GOAL #1   Title  Patient will increase Berg Balance score by > 6 points (45/56) to demonstrate decreased fall risk during functional activities.    Baseline  10/12: 39/56    Time  8    Period  Weeks    Status  New  Target Date  03/16/19      PT LONG TERM GOAL #2   Title  Patient will increase 10 meter walk test to >1.30m/s as to improve gait speed for better community ambulation and to reduce fall risk.    Baseline  10/12=0.71 m/s    Time  8    Period  Weeks    Status  New    Target Date  03/16/19      PT LONG TERM GOAL #3   Title  Patient will increase ABC scale score >80% to demonstrate better functional mobility and better confidence with ADLs.    Baseline  10/12: 42.5%    Time  8    Period  Weeks    Status  New    Target Date  03/16/19      PT LONG TERM GOAL #4   Title  Patient will ambulate without an AD for > 500 ft for return to PLOF with improved stability with prolonged mobility.    Baseline  10/12: requires use of walker    Time  8    Period  Weeks    Status  New    Target Date  03/16/19            Plan - 01/29/19 1144    Clinical Impression Statement  Patient demonstrates fair motivation throughout today's session. Patient was able to perform progressions to balance intervention including obstacle negotiation, required demonstrations, frequent UE support and intermittent rest breaks. Patient continues to be challenged by RLE weakness, general fatigue, and balancing on compliant surfaces, which improved with repetition, demonstration, and rest breaks. Patient would continue to benefit from skilled PT to  address the deficits outlined in this note.    Personal Factors and Comorbidities  Age;Comorbidity 3+;Fitness;Sex;Social Background;Past/Current Experience;Transportation    Comorbidities  R breast CA with history of radiation and partial mastectomy (2018), DM type II, HTN, thyroid surgery, HLD, hypothyroidism.    Examination-Activity Limitations  Bathing;Bend;Caring for Others;Dressing;Stairs;Squat;Locomotion Level;Stand;Lift;Transfers    Examination-Participation Restrictions  Cleaning;Church;Community Activity;Interpersonal Relationship;Laundry;Volunteer;Shop;Personal Finances;Meal Prep;Yard Work    [employer]chant [employer] officer  Evolving/Moderate complexity    Rehab Potential  Fair    PT Frequency  2x / week    PT Duration  8 weeks    PT Treatment/Interventions  ADLs/Self Care Home Management;Aquatic Therapy;Cryotherapy;Electrical Stimulation;Canalith Repostioning;Biofeedback;Moist Heat;Traction;Ultrasound;DME Instruction;Gait training;Stair training;Functional mobility training;Therapeutic activities;Patient/family education;Cognitive remediation;Neuromuscular re-education;Balance training;Therapeutic exercise;Orthotic Fit/Training;Manual techniques;Energy conservation;Passive range of motion;Taping;Vestibular    PT Next Visit Plan  weight shift/balance on RLE, foot clearance RLE    PT Home Exercise Plan  see above    Consulted and Agree with Plan of Care  Patient       Patient will benefit from skilled therapeutic intervention in order to improve the following deficits and impairments:  Abnormal gait, Cardiopulmonary status limiting activity, Decreased activity tolerance, Decreased balance, Decreased knowledge of precautions, Decreased endurance, Decreased coordination, Decreased cognition, Decreased knowledge of use of DME, Decreased mobility, Decreased safety awareness, Difficulty walking, Decreased strength, Impaired flexibility, Impaired perceived functional ability, Postural  dysfunction, Improper body mechanics  Visit Diagnosis: Muscle weakness (generalized)  Unsteadiness on feet  Other abnormalities of gait and mobility  Other lack of coordination     Problem List Patient Active Problem List   Diagnosis Date Noted  . TIA (transient ischemic attack) 12/10/2018  . Primary cancer of upper inner quadrant of right female breast ([place]) 12/25/2016   Jeneen Rinks A. Rosana Hoes, SPT This entire session was performed under direct supervision and direction of  a licensed Chiropractor . I have personally read, edited and approve of the note as written.  Trotter,Margaret PT, DPT 01/29/2019, 3:41 PM  [place] MAIN [facility] SERVICES [address] [place], [place], [address] Phone: [phone]   Fax:  [phone]  Name: Billijo Yerke MRN: [id number] Date of Birth: Apr 29, 1940

## 2019-01-29 NOTE — Therapy (Addendum)
[place] MAIN [facility] SERVICES [address] [place], [place], [address] Phone: [phone]   Fax:  [phone]  Occupational Therapy Treatment  Patient Details  Name: Meghan Dougherty MRN: [id number] Date of Birth: [birth year] No data recorded  Encounter Date: 01/29/2019  OT End of Session - 01/29/19 1034    Visit Number  3    Number of Visits  24    Date for OT Re-Evaluation  04/10/19    OT Start Time  1015    OT Stop Time  1100    OT Time Calculation (min)  45 min    Activity Tolerance  Patient tolerated treatment well    Behavior During Therapy  [facility] for tasks assessed/performed;Anxious;Flat affect       Past Medical History:  Diagnosis Date  . Borderline diabetes   . Breast cancer, right ([place]) 11/2016   invasive mammary carcinoma, Lumpectomy and rad tx's.   . Colon polyp   . Diabetes mellitus without complication ([place])    type 2  . Hyperlipemia   . Hypertension   . Hyperthyroidism   . Hypothyroidism   . Insomnia   . Osteopenia   . Personal history of radiation therapy 2018   right breast cancer    Past Surgical History:  Procedure Laterality Date  . ABDOMINAL HYSTERECTOMY     oophorectomy  . APPENDECTOMY    . BREAST BIOPSY Left    neg core  . BREAST BIOPSY Right    neg  core  . BREAST BIOPSY Right 11/2016   invasive mammary carcinoma, [place]  . BREAST BIOPSY Right 11/2016   benign, stero  . BREAST LUMPECTOMY Right 12/2016   invasive mammary carcinoma  Negative margins  . CATARACT EXTRACTION W/ INTRAOCULAR LENS IMPLANT    . CHOLECYSTECTOMY    . COLONOSCOPY    . COLONOSCOPY WITH PROPOFOL N/A 01/15/2018   Procedure: COLONOSCOPY WITH PROPOFOL;  Surgeon: Toledo, Benay Pike, MD;  Location: [facility] ENDOSCOPY;  Service: Gastroenterology;  Laterality: N/A;  . EYE SURGERY     bilateral cataracts  . OOPHORECTOMY    . PARTIAL MASTECTOMY WITH NEEDLE LOCALIZATION Right 01/04/2017   Procedure: PARTIAL MASTECTOMY WITH NEEDLE  LOCALIZATION;  Surgeon: Leonie Green, MD;  Location: [facility] ORS;  Service: General;  Laterality: Right;  . SENTINEL NODE BIOPSY Right 01/04/2017   Procedure: SENTINEL NODE BIOPSY;  Surgeon: Leonie Green, MD;  Location: [facility] ORS;  Service: General;  Laterality: Right;  . THYROID SURGERY     total thyroidectomy  . TONSILLECTOMY    . TOTAL THYROIDECTOMY      There were no vitals filed for this visit.  Subjective Assessment - 01/29/19 1033    Subjective   Pt arrived with her sister in law [place].    Patient is accompanied by:  Family member    Pertinent History  Patient 78 year old white female with a known history of diabetes,  dyslipidemia and hypothyroidism, presented to the emergency room with onset of slurred speech that started at 4 PM on 12/09/2018, with right facial droop and mild weakness on the right side.  Patient was evaluated and further work-up revealed her to have acute left internal capsule stroke.  Echocardiogram showed no evidence of cardioembolic cause.  CTA of the neck showed no large vessel occlusion.  Atherosclerotic disease was noted.    Patient Stated Goals  Patient reports she wants to walk, drive, and be independent again.    Currently in Pain?  No/denies  Therapeutic exercise: Pt. performed hand strengthening with pink theraputty. Pt. required cues for proper technique. Pt. worked on gross grip loop, lateral pinch, 3pt. pinch, gross digit extension, digit abduction loop, single digit extension loop, thumb opposition, and lumbical ex. Pt. required verbal and tactile cues for proper technique.  Neuromuscular re-ed: Pt used R hand with L to pick up small paperclips with 2 point pinch with extra time to grasp properly and then line up to make a chain using both hands and then unchain.  Difficulty with numbness in R hand after theraputty and needed time to regain feeling in her hand.   .                      OT Education - 01/29/19 1034     Education Details  [place] and strengthening    Person(s) Educated  Patient;Other (comment)    Methods  Explanation    Comprehension  Verbalized understanding;Returned demonstration;Need further instruction          OT Long Term Goals - 01/16/19 1447      OT LONG TERM GOAL #1   Title  Patient will demonstrate modified independence in all basic self care tasks including bathing in the shower.    Baseline  pt able to complete sponge bath but not yet bathing in shower.    Time  6    Period  Weeks    Status  New    Target Date  02/27/19      OT LONG TERM GOAL #2   Title  Patient will be independent in home exercise program for strength and coordination.    Baseline  no current program    Time  12    Period  Weeks    Status  New    Target Date  04/10/19      OT LONG TERM GOAL #3   Title  Patient will complete light homemaking skills with modified independence.    Baseline  assist with cleaning, laundry at eval    Time  12    Period  Weeks    Status  New    Target Date  04/10/19      OT LONG TERM GOAL #4   Title  Patient will demonstrate the abiity to hold a pen with secure tripod grasp and write her name with 75% legibility.    Baseline  difficulty with holding pen and forming letters of her name.    Time  6    Period  Weeks    Status  New    Target Date  04/10/19      OT LONG TERM GOAL #5   Title  Patient will demonstrate the ability to manage her finances online and written checks with modified independence.    Baseline  son is helping at eval.    Time  12    Period  Weeks    Status  New    Target Date  04/10/19      Long Term Additional Goals   Additional Long Term Goals  Yes      OT LONG TERM GOAL #6   Title  Patient will improve right fine motor coordination to apply her makeup with modified independence.    Baseline  unable at eval    Time  12    Period  Weeks    Status  New    Target Date  04/10/19      OT LONG TERM GOAL #  7   Title  Patient will  improve right hand function to operate the remote control for the TV.    Baseline  unable at eval    Time  12    Period  Weeks    Status  New    Target Date  04/10/19            Plan - 01/29/19 1035    Clinical Impression Statement  Pt arrived to session with her ssiter in law [place] and [place].  She was asking about how long she needed therapy and reports that she is living at home alone now and is doing more for herself but her R hand dexterity and strength is still impaired and is unable to write.  She fatigues easily with functional tasks more than she did prior to CVA. She continues to be eager to work on increasing functional use of her R hand and arm. She has mild increase in flexor tone in RUE and hand but did not interfere with functional fine motor training or strengthening. She has decreased control of R wrist extension and pronation in addition to weakness and decreased dexterity.  Rec continued skilled OT for improving muscle strength and coordination in R dominant UE for use in ADLs and IADLS as well as balance and functional mobilty training to maximize independence and safety in daily tasks.    OT Occupational Profile and History  Problem Focused Assessment - Including review of records relating to presenting problem    Occupational performance deficits (Please refer to evaluation for details):  ADL's;IADL's;Social Participation;Leisure    Body Structure / Function / Physical Skills  ADL;Coordination;UE functional use;Balance;Decreased knowledge of use of DME;IADL;Dexterity;FMC;Strength    Psychosocial Skills  Coping Strategies;Environmental  Adaptations;Routines and Behaviors;Habits    Rehab Potential  Good    Clinical Decision Making  Several treatment options, min-mod task modification necessary    Comorbidities Affecting Occupational Performance:  May have comorbidities impacting occupational performance    Modification or Assistance to Complete Evaluation   No modification of  tasks or assist necessary to complete eval    OT Frequency  2x / week    OT Duration  12 weeks    OT Treatment/Interventions  Self-care/ADL training;Therapeutic exercise;DME and/or AE instruction;Functional Mobility Training;Balance training;Neuromuscular education;Manual Therapy;Moist Heat;Therapeutic activities;Patient/family education    Consulted and Agree with Plan of Care  Patient       Patient will benefit from skilled therapeutic intervention in order to improve the following deficits and impairments:   Body Structure / Function / Physical Skills: ADL, Coordination, UE functional use, Balance, Decreased knowledge of use of DME, IADL, Dexterity, FMC, Strength   Psychosocial Skills: Coping Strategies, Environmental  Adaptations, Routines and Behaviors, Habits   Visit Diagnosis: Muscle weakness (generalized)  Other lack of coordination  Hemiplegia and hemiparesis following cerebral infarction affecting right dominant side [facility])    Problem List Patient Active Problem List   Diagnosis Date Noted  . TIA (transient ischemic attack) 12/10/2018  . Primary cancer of upper inner quadrant of right female breast [facility]) 12/25/2016   Chrys Racer, OTR/L, [place] [phone] 01/29/19, 11:03 AM  [place], [place], [address] Phone: [phone]   Fax:  [phone]  Name: Meghan Dougherty MRN: [id number] Date of Birth: 1940/11/13

## 2019-02-02 ENCOUNTER — Ambulatory Visit: Payer: Medicare Other | Admitting: Physical Therapy

## 2019-02-02 ENCOUNTER — Encounter: Payer: Self-pay | Admitting: Physical Therapy

## 2019-02-02 ENCOUNTER — Ambulatory Visit: Payer: Medicare Other | Admitting: Occupational Therapy

## 2019-02-02 ENCOUNTER — Encounter: Payer: Self-pay | Admitting: Occupational Therapy

## 2019-02-02 ENCOUNTER — Other Ambulatory Visit: Payer: Self-pay

## 2019-02-02 ENCOUNTER — Ambulatory Visit: Payer: Medicare Other | Admitting: Speech Pathology

## 2019-02-02 ENCOUNTER — Encounter: Payer: Self-pay | Admitting: Speech Pathology

## 2019-02-02 DIAGNOSIS — R2681 Unsteadiness on feet: Secondary | ICD-10-CM

## 2019-02-02 DIAGNOSIS — M6281 Muscle weakness (generalized): Secondary | ICD-10-CM

## 2019-02-02 DIAGNOSIS — R2689 Other abnormalities of gait and mobility: Secondary | ICD-10-CM

## 2019-02-02 DIAGNOSIS — R4701 Aphasia: Secondary | ICD-10-CM

## 2019-02-02 DIAGNOSIS — R1313 Dysphagia, pharyngeal phase: Secondary | ICD-10-CM

## 2019-02-02 DIAGNOSIS — I69351 Hemiplegia and hemiparesis following cerebral infarction affecting right dominant side: Secondary | ICD-10-CM

## 2019-02-02 DIAGNOSIS — R278 Other lack of coordination: Secondary | ICD-10-CM

## 2019-02-02 NOTE — Therapy (Signed)
[place] MAIN [facility] SERVICES [address] [place], [place], [address] Phone: [phone]   Fax:  [phone]  Occupational Therapy Treatment  Patient Details  Name: Meghan Dougherty MRN: [id number] Date of Birth: [birth year] No data recorded  Encounter Date: 02/02/2019  OT End of Session - 02/03/19 1937    Visit Number  4    Number of Visits  24    Date for OT Re-Evaluation  04/10/19    OT Start Time  1015    OT Stop Time  1059    OT Time Calculation (min)  44 min    Activity Tolerance  Patient tolerated treatment well    Behavior During Therapy  [facility] for tasks assessed/performed;Anxious;Flat affect       Past Medical History:  Diagnosis Date  . Borderline diabetes   . Breast cancer, right ([place]) 11/2016   invasive mammary carcinoma, Lumpectomy and rad tx's.   . Colon polyp   . Diabetes mellitus without complication ([place])    type 2  . Hyperlipemia   . Hypertension   . Hyperthyroidism   . Hypothyroidism   . Insomnia   . Osteopenia   . Personal history of radiation therapy 2018   right breast cancer    Past Surgical History:  Procedure Laterality Date  . ABDOMINAL HYSTERECTOMY     oophorectomy  . APPENDECTOMY    . BREAST BIOPSY Left    neg core  . BREAST BIOPSY Right    neg  core  . BREAST BIOPSY Right 11/2016   invasive mammary carcinoma, [place]  . BREAST BIOPSY Right 11/2016   benign, stero  . BREAST LUMPECTOMY Right 12/2016   invasive mammary carcinoma  Negative margins  . CATARACT EXTRACTION W/ INTRAOCULAR LENS IMPLANT    . CHOLECYSTECTOMY    . COLONOSCOPY    . COLONOSCOPY WITH PROPOFOL N/A 01/15/2018   Procedure: COLONOSCOPY WITH PROPOFOL;  Surgeon: Toledo, Benay Pike, MD;  Location: [facility] ENDOSCOPY;  Service: Gastroenterology;  Laterality: N/A;  . EYE SURGERY     bilateral cataracts  . OOPHORECTOMY    . PARTIAL MASTECTOMY WITH NEEDLE LOCALIZATION Right 01/04/2017   Procedure: PARTIAL MASTECTOMY WITH NEEDLE  LOCALIZATION;  Surgeon: Leonie Green, MD;  Location: [facility] ORS;  Service: General;  Laterality: Right;  . SENTINEL NODE BIOPSY Right 01/04/2017   Procedure: SENTINEL NODE BIOPSY;  Surgeon: Leonie Green, MD;  Location: [facility] ORS;  Service: General;  Laterality: Right;  . THYROID SURGERY     total thyroidectomy  . TONSILLECTOMY    . TOTAL THYROIDECTOMY      There were no vitals filed for this visit.  Subjective Assessment - 02/03/19 1937    Subjective   "How can I get home therapy?"    Pertinent History  Patient 78 year old white female with a known history of diabetes,  dyslipidemia and hypothyroidism, presented to the emergency room with onset of slurred speech that started at 4 PM on 12/09/2018, with right facial droop and mild weakness on the right side.  Patient was evaluated and further work-up revealed her to have acute left internal capsule stroke.  Echocardiogram showed no evidence of cardioembolic cause.  CTA of the neck showed no large vessel occlusion.  Atherosclerotic disease was noted.    Patient Stated Goals  Patient reports she wants to walk, drive, and be independent again.    Currently in Pain?  No/denies    Multiple Pain Sites  No  Therapeutic Exercise:   Patient seen this date for UE ROM and strengthening with use of 2# dowel for shoulder flexion, ABD/ADD, chest press, forwards and backwards circles, elbow flexion/extension and diagonal patterns for 2 sets of 12 repetitions each with cues for proper form and technique, therapist demonstration for each exercise.  Cues for pacing of self and rest breaks as needed between exercises and/or sets.    Neuromuscular reeducation:  Patient seen this date for manipulation skills with picking up round objects with right hand and placing onto board.  Cues for prehension patterns on the right when picking up objects.  Advanced to translatory skills of the hand and using the hand for storage with therapist demonstration  and cues.  Patient able to hold 2-3 objects in palm with minimal droppage today.    Response to tx:  Patient able to demonstrate exercises with cues and therapist demonstration for proper form and technique.  Patient continues to demonstrate weakness in right hand and arm along with decreased fine motor coordination skills.  Patient has progressed to be able to translatory skills of the hand and using the hand for storage but still drops items frequently.  Continue to work towards goals in plan of care to maximize safety and independence in daily tasks.                       OT Education - 02/04/19 1934    Education Details  strengthening tasks, fine motor coordination skills    Person(s) Educated  Patient    Methods  Explanation;Demonstration    Comprehension  Verbalized understanding;Returned demonstration;Need further instruction          OT Long Term Goals - 01/16/19 1447      OT LONG TERM GOAL #1   Title  Patient will demonstrate modified independence in all basic self care tasks including bathing in the shower.    Baseline  pt able to complete sponge bath but not yet bathing in shower.    Time  6    Period  Weeks    Status  New    Target Date  02/27/19      OT LONG TERM GOAL #2   Title  Patient will be independent in home exercise program for strength and coordination.    Baseline  no current program    Time  12    Period  Weeks    Status  New    Target Date  04/10/19      OT LONG TERM GOAL #3   Title  Patient will complete light homemaking skills with modified independence.    Baseline  assist with cleaning, laundry at eval    Time  12    Period  Weeks    Status  New    Target Date  04/10/19      OT LONG TERM GOAL #4   Title  Patient will demonstrate the abiity to hold a pen with secure tripod grasp and write her name with 75% legibility.    Baseline  difficulty with holding pen and forming letters of her name.    Time  6    Period  Weeks     Status  New    Target Date  04/10/19      OT LONG TERM GOAL #5   Title  Patient will demonstrate the ability to manage her finances online and written checks with modified independence.    Baseline  son is helping  at eval.    Time  12    Period  Weeks    Status  New    Target Date  04/10/19      Long Term Additional Goals   Additional Long Term Goals  Yes      OT LONG TERM GOAL #6   Title  Patient will improve right fine motor coordination to apply her makeup with modified independence.    Baseline  unable at eval    Time  12    Period  Weeks    Status  New    Target Date  04/10/19      OT LONG TERM GOAL #7   Title  Patient will improve right hand function to operate the remote control for the TV.    Baseline  unable at eval    Time  12    Period  Weeks    Status  New    Target Date  04/10/19            Plan - 02/03/19 1938    Clinical Impression Statement  Patient able to demonstrate exercises with cues and therapist demonstration for proper form and technique.  Patient continues to demonstrate weakness in right hand and arm along with decreased fine motor coordination skills.  Patient has progressed to be able to translatory skills of the hand and using the hand for storage but still drops items frequently.  Continue to work towards goals in plan of care to maximize safety and independence in daily tasks.    OT Occupational Profile and History  Problem Focused Assessment - Including review of records relating to presenting problem    Occupational performance deficits (Please refer to evaluation for details):  ADL's;IADL's;Social Participation;Leisure    Body Structure / Function / Physical Skills  ADL;Coordination;UE functional use;Balance;Decreased knowledge of use of DME;IADL;Dexterity;FMC;Strength    Psychosocial Skills  Coping Strategies;Environmental  Adaptations;Routines and Behaviors;Habits    Rehab Potential  Good    Clinical Decision Making  Several treatment  options, min-mod task modification necessary    Comorbidities Affecting Occupational Performance:  May have comorbidities impacting occupational performance    Modification or Assistance to Complete Evaluation   No modification of tasks or assist necessary to complete eval    OT Frequency  2x / week    OT Duration  12 weeks    OT Treatment/Interventions  Self-care/ADL training;Therapeutic exercise;DME and/or AE instruction;Functional Mobility Training;Balance training;Neuromuscular education;Manual Therapy;Moist Heat;Therapeutic activities;Patient/family education    Consulted and Agree with Plan of Care  Patient       Patient will benefit from skilled therapeutic intervention in order to improve the following deficits and impairments:   Body Structure / Function / Physical Skills: ADL, Coordination, UE functional use, Balance, Decreased knowledge of use of DME, IADL, Dexterity, FMC, Strength   Psychosocial Skills: Coping Strategies, Environmental  Adaptations, Routines and Behaviors, Habits   Visit Diagnosis: Muscle weakness (generalized)  Other lack of coordination  Hemiplegia and hemiparesis following cerebral infarction affecting right dominant side [facility])    Problem List Patient Active Problem List   Diagnosis Date Noted  . TIA (transient ischemic attack) 12/10/2018  . Primary cancer of upper inner quadrant of right female breast ([place]) 12/25/2016   Meghan Dougherty, Meghan Dougherty, Meghan Dougherty  Meghan Dougherty 02/04/2019, 7:38 PM  [place] MAIN [facility] SERVICES [address] [place], [place], [address] Phone: [phone]   Fax:  [phone]  Name: Meghan Dougherty MRN: [id number] Date of  Birth: 07-24-1940

## 2019-02-02 NOTE — Therapy (Signed)
[place] MAIN [facility] SERVICES [address] [place], [place], [address] Phone: [phone]   Fax:  [phone]  Speech Language Pathology Treatment  Patient Details  Name: Meghan Dougherty MRN: [id number] Date of Birth: Aug 18, 1940 Referring Provider (SLP): Jari Pigg   Encounter Date: 02/02/2019  End of Session - 02/02/19 1043    Visit Number  4    Number of Visits  16    Date for SLP Re-Evaluation  03/13/19    Authorization Type  Medicare    Authorization Time Period  Start 01/16/2019    Authorization - Visit Number  4    Authorization - Number of Visits  10    SLP Start Time  [id number]    SLP Stop Time   0945    SLP Time Calculation (min)  50 min    Activity Tolerance  Patient tolerated treatment well       Past Medical History:  Diagnosis Date  . Borderline diabetes   . Breast cancer, right ([place]) 11/2016   invasive mammary carcinoma, Lumpectomy and rad tx's.   . Colon polyp   . Diabetes mellitus without complication ([place])    type 2  . Hyperlipemia   . Hypertension   . Hyperthyroidism   . Hypothyroidism   . Insomnia   . Osteopenia   . Personal history of radiation therapy 2018   right breast cancer    Past Surgical History:  Procedure Laterality Date  . ABDOMINAL HYSTERECTOMY     oophorectomy  . APPENDECTOMY    . BREAST BIOPSY Left    neg core  . BREAST BIOPSY Right    neg  core  . BREAST BIOPSY Right 11/2016   invasive mammary carcinoma, [place]  . BREAST BIOPSY Right 11/2016   benign, stero  . BREAST LUMPECTOMY Right 12/2016   invasive mammary carcinoma  Negative margins  . CATARACT EXTRACTION W/ INTRAOCULAR LENS IMPLANT    . CHOLECYSTECTOMY    . COLONOSCOPY    . COLONOSCOPY WITH PROPOFOL N/A 01/15/2018   Procedure: COLONOSCOPY WITH PROPOFOL;  Surgeon: Toledo, Benay Pike, MD;  Location: [facility] ENDOSCOPY;  Service: Gastroenterology;  Laterality: N/A;  . EYE SURGERY     bilateral cataracts  . OOPHORECTOMY    . PARTIAL  MASTECTOMY WITH NEEDLE LOCALIZATION Right 01/04/2017   Procedure: PARTIAL MASTECTOMY WITH NEEDLE LOCALIZATION;  Surgeon: Leonie Green, MD;  Location: [facility] ORS;  Service: General;  Laterality: Right;  . SENTINEL NODE BIOPSY Right 01/04/2017   Procedure: SENTINEL NODE BIOPSY;  Surgeon: Leonie Green, MD;  Location: [facility] ORS;  Service: General;  Laterality: Right;  . THYROID SURGERY     total thyroidectomy  . TONSILLECTOMY    . TOTAL THYROIDECTOMY      There were no vitals filed for this visit.  Subjective Assessment - 02/02/19 1041    Subjective  The patient was alert, cooperative, and pleasant throughout the therapy session. She reported experiencing increased leg pain over the weekend.            ADULT SLP TREATMENT - 02/02/19 0001      General Information   Behavior/Cognition  Alert;Cooperative;Pleasant mood    HPI  78 year old female referred for outpatient speech therapy after CVA in September 2020. 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 MBS was completed during acute hospitalization, with recommendation for Dys 2 solids and honey thick liquids.      Treatment Provided   Treatment provided  Cognitive-Linquistic  Dysphagia Treatment   Other treatment/comments  Recall of safe swallow strategies      Pain Assessment   Pain Assessment  No/denies pain      Cognitive-Linquistic Treatment   Treatment focused on  Aphasia    Skilled Treatment  DYSPHAGIA: Patient reported placing the visual aid listing safe swallow precautions provided during the most recent therapy session at her kitchen table and having transitioned to eating her meals there (instead of in the living room while watching television). Patient reported improved implementation of safe swallowing precautions with use of the visual aid and change in location for meals, with the exception of alternation of solid and liquid intake (patient prefers no drink at mealtimes). Rationale for  this strategy was again reviewed. Patient verbalized understanding and agreed to try incorporating liquid intake at mealtimes. WORD RETRIEVAL: Patient added members to abstract categories with 55% accuracy without interventions. Given semantic cueing, accuracy increased to 80%. Patient completed verbal analogies with 60% accuracy independently. Given semantic cueing, accuracy improved to 92%. Patient noted with delayed response times and articulatory imprecision across therapy tasks. During word retrieval tasks, patient became frustrated and referred to herself as "stupid" during moments of word finding difficulty. Patient education was provided regarding the negative impact of this reaction on the brain's ability to complete word retrieval tasks. Patient verbalized understanding.      Assessment / Recommendations / Plan   Plan  Continue with current plan of care      Progression Toward Goals   Progression toward goals  Progressing toward goals       SLP Education - 02/02/19 1041    Education Details  When you beat yourself up and call yourself "stupid", that makes it even harder for your brain to find the words.    Person(s) Educated  Patient    Methods  Explanation    Comprehension  Verbalized understanding         SLP Long Term Goals - 01/16/19 1246      SLP LONG TERM GOAL #1   Title  Pt will complete word retrieval tasks of increasing complexity with 80% accuracy given min assist    Time  8    Period  Weeks    Status  New    Target Date  03/13/19      SLP LONG TERM GOAL #2   Title  Pt will verbalize awareness of and adherence to safe swallow strategies to maximize safe po intake.    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 She verbalizes understanding that divided attention is more difficult for her brain now  following her stroke and is reluctantly willing to transition to new habits of focusing on one task at a time. Visual aid provided for facilitating recall of safe swallow strategies now implemented in patient's home environment, with reported improvement in use of precautions. Patient becomes frustrated during complex word retrieval tasks, though she is responsive to cueing.    Speech Therapy Frequency  2x / week    Duration  Other (comment)    Treatment/Interventions  Aspiration precaution training;Pharyngeal strengthening exercises;Diet toleration management by SLP;Internal/external aids;Cueing hierarchy;Compensatory techniques;Language facilitation;Functional tasks;SLP instruction and feedback;Patient/family education;Compensatory strategies;Multimodal communcation approach    Potential to Achieve Goals  Good  Potential Considerations  Ability to learn/carryover information;Cooperation/participation level;Previous level of function;Family/community support    SLP Home Exercise Plan  Provided       Patient will benefit from skilled therapeutic intervention in order to improve the following deficits and impairments:   Aphasia  Pharyngeal dysphagia    Problem List Patient Active Problem List   Diagnosis Date Noted  . TIA (transient ischemic attack) 12/10/2018  . Primary cancer of upper inner quadrant of right female breast ([place]) 12/25/2016   Dove Gresham A. Francis Dowse., Graduate Clinician Vella Kohler 02/02/2019, 10:44 AM  [place] MAIN [facility] SERVICES [address] [place], [place], [address] Phone: [phone]   Fax:  [phone]   Name: Meghan Dougherty MRN: [id number] Date of Birth: 11/17/1940

## 2019-02-02 NOTE — Therapy (Signed)
[place] MAIN [facility] SERVICES [address] [place], [place], [address] Phone: [phone]   Fax:  [phone]  Physical Therapy Treatment  Patient Details  Name: Meghan Dougherty MRN: [id number] Date of Birth: March 11, 1941 Referring Provider (PT): Doran Clay   Encounter Date: 02/02/2019  PT End of Session - 02/02/19 1146    Visit Number  4    Number of Visits  16    Date for PT Re-Evaluation  03/16/19    Authorization Type  2/10 eval 01/19/19    PT Start Time  1102    PT Stop Time  1145    PT Time Calculation (min)  43 min    Equipment Utilized During Treatment  Gait belt    Activity Tolerance  Patient tolerated treatment well;Patient limited by fatigue    Behavior During Therapy  [facility] for tasks assessed/performed;Anxious;Flat affect       Past Medical History:  Diagnosis Date  . Borderline diabetes   . Breast cancer, right ([place]) 11/2016   invasive mammary carcinoma, Lumpectomy and rad tx's.   . Colon polyp   . Diabetes mellitus without complication ([place])    type 2  . Hyperlipemia   . Hypertension   . Hyperthyroidism   . Hypothyroidism   . Insomnia   . Osteopenia   . Personal history of radiation therapy 2018   right breast cancer    Past Surgical History:  Procedure Laterality Date  . ABDOMINAL HYSTERECTOMY     oophorectomy  . APPENDECTOMY    . BREAST BIOPSY Left    neg core  . BREAST BIOPSY Right    neg  core  . BREAST BIOPSY Right 11/2016   invasive mammary carcinoma, [place]  . BREAST BIOPSY Right 11/2016   benign, stero  . BREAST LUMPECTOMY Right 12/2016   invasive mammary carcinoma  Negative margins  . CATARACT EXTRACTION W/ INTRAOCULAR LENS IMPLANT    . CHOLECYSTECTOMY    . COLONOSCOPY    . COLONOSCOPY WITH PROPOFOL N/A 01/15/2018   Procedure: COLONOSCOPY WITH PROPOFOL;  Surgeon: Toledo, Benay Pike, MD;  Location: [facility] ENDOSCOPY;  Service: Gastroenterology;  Laterality: N/A;  . EYE SURGERY     bilateral  cataracts  . OOPHORECTOMY    . PARTIAL MASTECTOMY WITH NEEDLE LOCALIZATION Right 01/04/2017   Procedure: PARTIAL MASTECTOMY WITH NEEDLE LOCALIZATION;  Surgeon: Leonie Green, MD;  Location: [facility] ORS;  Service: General;  Laterality: Right;  . SENTINEL NODE BIOPSY Right 01/04/2017   Procedure: SENTINEL NODE BIOPSY;  Surgeon: Leonie Green, MD;  Location: [facility] ORS;  Service: General;  Laterality: Right;  . THYROID SURGERY     total thyroidectomy  . TONSILLECTOMY    . TOTAL THYROIDECTOMY      There were no vitals filed for this visit.  Subjective Assessment - 02/02/19 1145    Subjective  Patient reports that she was very sore after last session with PT. Doing well today, no new falls or concerns.    Patient is accompained by:  --   friend   Pertinent History  Patient admitted to the hospital on 12/10/18 due to having slurred speech and then a fall when her R knee gave out. She was noted to have R facial droop, R sided weakness, expressive aphasia, and dysarthria. 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 PMH includes R breast CA with history of radiation and partial mastectomy (2018), DM type II, HTN, thyroid  surgery, HLD, hypothyroidism. While in the hospital was able to walk with walker. Patient was then d/c on 12/19/18 to [facility] where she was discharged 01/07/19    How long can you sit comfortably?  all day    How long can you stand comfortably?  unsteady as soon as standing, reports about an hour    How long can you walk comfortably?  needs walker    Patient Stated Goals  to walk without a walker    Currently in Pain?  No/denies    Multiple Pain Sites  No       Treatment:  NMRE:  In // bars:  -Tandem stance 2x30 sec each LE forward; occasional UE support  -Standing with one foot on 6" step 2x30 sec each LE forward   -Standing with one foot on 6" step x10 head turns side/side, each LE  forward; slight postural sway in direction of head turn, cues to slow down movement.  -Alternating toe taps to 6" step x20    -Alternating step ups to 6" step x10, SUE support to fingertip support  -Heel-toe raises x10, unsupported;  -Small rocker-board anterior/posterior tilts x2 min  -Half-stance weight shifts fwd/bwd 2x10 each LE forward, unsupported, followed by a step; cues to limit hip thrust with anterior shift   -Fwd/bwd step over orange hurdle x10 each direction; occasional UE assist  -STS x10 hands on knees  -STS from airex foam x10 hands on knees, to replicate pt difficulty with getting out of recliner at home; required multiple attempts to lift hips from chair, min VC to lean forward enough to stand  -Balloon taps against mirror from airex foam, feet together x2 min, CGA  Pt educated throughout session about proper posture and technique with exercises. Improved exercise technique, movement at target joints, use of target muscles after min to mod verbal, visual, tactile cues.   Neuro Re-ed: Patient requires CGA during all standing interventions due to limited stability and patient fear of LOB. Cueing for reduction of UE support required as well as postural alignment for optimal stability within COM    Patient demonstrates good motivation throughout today's session. Patient was able to perform progressions to balance intervention with minimal fatigue, required occasional UE support and min VC for sequencing. Patient continues to be challenged by imbalance during step negotiation and standing with a narrow BOS, which improved with repetition, postural correction cues, and intermittent rest breaks. Patient would continue to benefit from skilled PT to address the deficits outlined in this note.      PT Education - 02/02/19 1145    Education Details  Balance, body mechanics    Person(s) Educated  Patient    Methods  Explanation;Demonstration;Tactile cues    Comprehension   Verbalized understanding;Returned demonstration;Verbal cues required;Tactile cues required       PT Short Term Goals - 01/19/19 1305      PT SHORT TERM GOAL #1   Title  Patient will be independent in home exercise program to improve strength/mobility for better functional independence with ADLs.    Baseline  10/12: HEP given    Time  2    Period  Weeks    Status  New    Target Date  02/02/19        PT Long Term Goals - 01/19/19 1306      PT LONG TERM GOAL #1   Title  Patient will increase Berg Balance score by > 6 points (45/56) to demonstrate decreased fall  risk during functional activities.    Baseline  10/12: 39/56    Time  8    Period  Weeks    Status  New    Target Date  03/16/19      PT LONG TERM GOAL #2   Title  Patient will increase 10 meter walk test to >1.36m/s as to improve gait speed for better community ambulation and to reduce fall risk.    Baseline  10/12=0.71 m/s    Time  8    Period  Weeks    Status  New    Target Date  03/16/19      PT LONG TERM GOAL #3   Title  Patient will increase ABC scale score >80% to demonstrate better functional mobility and better confidence with ADLs.    Baseline  10/12: 42.5%    Time  8    Period  Weeks    Status  New    Target Date  03/16/19      PT LONG TERM GOAL #4   Title  Patient will ambulate without an AD for > 500 ft for return to PLOF with improved stability with prolonged mobility.    Baseline  10/12: requires use of walker    Time  8    Period  Weeks    Status  New    Target Date  03/16/19            Plan - 02/02/19 1255    Clinical Impression Statement  Patient demonstrates good motivation throughout today's session. Patient was able to perform progressions to balance intervention with minimal fatigue, required occasional UE support and min VC for sequencing. Patient continues to be challenged by imbalance during step negotiation and standing with a narrow BOS, which improved with repetition, postural  correction cues, and intermittent rest breaks. Patient would continue to benefit from skilled PT to address the deficits outlined in this note.    Personal Factors and Comorbidities  Age;Comorbidity 3+;Fitness;Sex;Social Background;Past/Current Experience;Transportation    Comorbidities  R breast CA with history of radiation and partial mastectomy (2018), DM type II, HTN, thyroid surgery, HLD, hypothyroidism.    Examination-Activity Limitations  Bathing;Bend;Caring for Others;Dressing;Stairs;Squat;Locomotion Level;Stand;Lift;Transfers    Examination-Participation Restrictions  Cleaning;Church;Community Activity;Interpersonal Relationship;Laundry;Volunteer;Shop;Personal Finances;Meal Prep;Yard Work    [employer]  Evolving/Moderate complexity    Rehab Potential  Fair    PT Frequency  2x / week    PT Duration  8 weeks    PT Treatment/Interventions  ADLs/Self Care Home Management;Aquatic Therapy;Cryotherapy;Electrical Stimulation;Canalith Repostioning;Biofeedback;Moist Heat;Traction;Ultrasound;DME Instruction;Gait training;Stair training;Functional mobility training;Therapeutic activities;Patient/family education;Cognitive remediation;Neuromuscular re-education;Balance training;Therapeutic exercise;Orthotic Fit/Training;Manual techniques;Energy conservation;Passive range of motion;Taping;Vestibular    PT Next Visit Plan  weight shift/balance on RLE, foot clearance RLE, progress static and dynamic balance    Consulted and Agree with Plan of Care  Patient       Patient will benefit from skilled therapeutic intervention in order to improve the following deficits and impairments:  Abnormal gait, Cardiopulmonary status limiting activity, Decreased activity tolerance, Decreased balance, Decreased knowledge of precautions, Decreased endurance, Decreased coordination, Decreased cognition, Decreased knowledge of use of DME, Decreased mobility, Decreased safety awareness, Difficulty walking,  Decreased strength, Impaired flexibility, Impaired perceived functional ability, Postural dysfunction, Improper body mechanics  Visit Diagnosis: Muscle weakness (generalized)  Unsteadiness on feet  Other abnormalities of gait and mobility  Other lack of coordination     Problem List Patient Active Problem List   Diagnosis Date Noted  . TIA (transient ischemic attack) 12/10/2018  .  Primary cancer of upper inner quadrant of right female breast ([place]) 12/25/2016   Jeneen Rinks A. Rosana Hoes, SPT This entire session was performed under direct supervision and direction of a licensed therapist/therapist assistant . I have personally read, edited and approve of the note as written.  Trotter,Margaret PT, DPT 02/03/2019, 8:07 AM  [place] MAIN [facility] SERVICES [address] [place], [place], [address] Phone: [phone]   Fax:  [phone]  Name: Meghan Dougherty MRN: [id number] Date of Birth: [birth year]

## 2019-02-04 ENCOUNTER — Ambulatory Visit: Payer: Medicare Other | Admitting: Speech Pathology

## 2019-02-04 ENCOUNTER — Encounter: Payer: Self-pay | Admitting: Speech Pathology

## 2019-02-04 ENCOUNTER — Ambulatory Visit: Payer: Medicare Other

## 2019-02-04 ENCOUNTER — Other Ambulatory Visit: Payer: Self-pay

## 2019-02-04 DIAGNOSIS — R1313 Dysphagia, pharyngeal phase: Secondary | ICD-10-CM

## 2019-02-04 DIAGNOSIS — R278 Other lack of coordination: Secondary | ICD-10-CM

## 2019-02-04 DIAGNOSIS — R4701 Aphasia: Secondary | ICD-10-CM

## 2019-02-04 DIAGNOSIS — R2689 Other abnormalities of gait and mobility: Secondary | ICD-10-CM

## 2019-02-04 DIAGNOSIS — R2681 Unsteadiness on feet: Secondary | ICD-10-CM

## 2019-02-04 DIAGNOSIS — M6281 Muscle weakness (generalized): Secondary | ICD-10-CM

## 2019-02-04 NOTE — Therapy (Signed)
[place] MAIN [facility] SERVICES [address] [place], [place], [address] Phone: [phone]   Fax:  [phone]  Physical Therapy Treatment  Patient Details  Name: Meghan Dougherty MRN: [id number] Date of Birth: October 26, 1940 Referring Provider (PT): Doran Clay   Encounter Date: 02/04/2019  PT End of Session - 02/04/19 0806    Visit Number  5    Number of Visits  16    Date for PT Re-Evaluation  03/16/19    Authorization Type  5/10 eval 01/19/19    PT Start Time  0801    PT Stop Time  0844    PT Time Calculation (min)  43 min    Equipment Utilized During Treatment  Gait belt    Activity Tolerance  Patient tolerated treatment well;Patient limited by fatigue    Behavior During Therapy  [facility] for tasks assessed/performed;Anxious;Flat affect       Past Medical History:  Diagnosis Date  . Borderline diabetes   . Breast cancer, right ([place]) 11/2016   invasive mammary carcinoma, Lumpectomy and rad tx's.   . Colon polyp   . Diabetes mellitus without complication ([place])    type 2  . Hyperlipemia   . Hypertension   . Hyperthyroidism   . Hypothyroidism   . Insomnia   . Osteopenia   . Personal history of radiation therapy 2018   right breast cancer    Past Surgical History:  Procedure Laterality Date  . ABDOMINAL HYSTERECTOMY     oophorectomy  . APPENDECTOMY    . BREAST BIOPSY Left    neg core  . BREAST BIOPSY Right    neg  core  . BREAST BIOPSY Right 11/2016   invasive mammary carcinoma, [place]  . BREAST BIOPSY Right 11/2016   benign, stero  . BREAST LUMPECTOMY Right 12/2016   invasive mammary carcinoma  Negative margins  . CATARACT EXTRACTION W/ INTRAOCULAR LENS IMPLANT    . CHOLECYSTECTOMY    . COLONOSCOPY    . COLONOSCOPY WITH PROPOFOL N/A 01/15/2018   Procedure: COLONOSCOPY WITH PROPOFOL;  Surgeon: Toledo, Benay Pike, MD;  Location: [facility] ENDOSCOPY;  Service: Gastroenterology;  Laterality: N/A;  . EYE SURGERY     bilateral  cataracts  . OOPHORECTOMY    . PARTIAL MASTECTOMY WITH NEEDLE LOCALIZATION Right 01/04/2017   Procedure: PARTIAL MASTECTOMY WITH NEEDLE LOCALIZATION;  Surgeon: Leonie Green, MD;  Location: [facility] ORS;  Service: General;  Laterality: Right;  . SENTINEL NODE BIOPSY Right 01/04/2017   Procedure: SENTINEL NODE BIOPSY;  Surgeon: Leonie Green, MD;  Location: [facility] ORS;  Service: General;  Laterality: Right;  . THYROID SURGERY     total thyroidectomy  . TONSILLECTOMY    . TOTAL THYROIDECTOMY      There were no vitals filed for this visit.  Subjective Assessment - 02/04/19 0805    Subjective  Patient reports no falls or LOB since last sessions. Reports feeling very sleepy since it is an early morning session.    Patient is accompained by:  --   friend   Pertinent History  Patient admitted to the hospital on 12/10/18 due to having slurred speech and then a fall when her R knee gave out. She was noted to have R facial droop, R sided weakness, expressive aphasia, and dysarthria. 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 PMH includes R breast CA with history of radiation and partial mastectomy (2018), DM type II, HTN, thyroid  surgery, HLD, hypothyroidism. While in the hospital was able to walk with walker. Patient was then d/c on 12/19/18 to [place] rehab where she was discharged 01/07/19    How long can you sit comfortably?  all day    How long can you stand comfortably?  unsteady as soon as standing, reports about an hour    How long can you walk comfortably?  needs walker    Patient Stated Goals  to walk without a walker    Currently in Pain?  No/denies        Treatment:    Nustep Lvl 3 RPM> 60 for cardiovascular challenge, 3 minutes   // bars:    airex pad:  -one foot on airex pad one foot on each square : 2x 30 second holds   -hedgehog taps, SUE support, challenging to RUE  4 minutes   -RTB side step  4x length of // bars; challenge lifting RLE with mobility  Bosu ball modified lunges 10x each LE; BUE support   Bosu ball lateral lunges modified 10x each LE BUE support     Standing with # 2lb  ankle weight: CGA for stability  -Hip extension with bilateral upper extremity support, cueing for neutral hip alignment, upright posture for optimal muscle recruitment, and sequencing, 10x each LE,  -Hip abduction with bilateral upper extremity support, cueing for neutral foot alignment for correct muscle activation, 10x each LE -Hip flexion with bilateral upper extremity support, cueing for body mechanics, speed of muscle recruitment for optimal strengthening and stabilization 10x each LE    Seated with # 2lb ankle weights  -Seated marches with upright posture, back away from back of chair for abdominal/trunk activation/stabilization, 10x each LE -Seated LAQ with 3 second holds, 10x each LE, cueing for muscle activation and sequencing for neutral alignment -Seated IR/ER with cueing for stabilizing knee placement with lateral foot movement for optimal muscle recruitment, 10x each LE   Pt educated throughout session about proper posture and technique with exercises. Improved exercise technique, movement at target joints, use of target muscles after min to mod verbal, visual, tactile cues                       PT Education - 02/04/19 0806    Education Details  exercise technique, body mechanics,    Person(s) Educated  Patient    Methods  Explanation;Demonstration;Tactile cues;Verbal cues    Comprehension  Verbalized understanding;Returned demonstration;Verbal cues required;Tactile cues required       PT Short Term Goals - 01/19/19 1305      PT SHORT TERM GOAL #1   Title  Patient will be independent in home exercise program to improve strength/mobility for better functional independence with ADLs.    Baseline  10/12: HEP given    Time  2    Period  Weeks    Status  New     Target Date  02/02/19        PT Long Term Goals - 01/19/19 1306      PT LONG TERM GOAL #1   Title  Patient will increase Berg Balance score by > 6 points (45/56) to demonstrate decreased fall risk during functional activities.    Baseline  10/12: 39/56    Time  8    Period  Weeks    Status  New    Target Date  03/16/19      PT LONG TERM GOAL #2   Title  Patient will increase 10 meter walk test to >1.83m/s as to improve gait speed for better community ambulation and to reduce fall risk.    Baseline  10/12=0.71 m/s    Time  8    Period  Weeks    Status  New    Target Date  03/16/19      PT LONG TERM GOAL #3   Title  Patient will increase ABC scale score >80% to demonstrate better functional mobility and better confidence with ADLs.    Baseline  10/12: 42.5%    Time  8    Period  Weeks    Status  New    Target Date  03/16/19      PT LONG TERM GOAL #4   Title  Patient will ambulate without an AD for > 500 ft for return to PLOF with improved stability with prolonged mobility.    Baseline  10/12: requires use of walker    Time  8    Period  Weeks    Status  New    Target Date  03/16/19            Plan - 02/04/19 1008    Clinical Impression Statement  Patient is challenged with RLE coordination and spatial awareness, she improved with repetition and use of UE's for stability. She fatigues quickly in standing against resistance bands and weighted LE's requiring encouragement for continuation of tasks. Patient will benefit from skilled physical therapy for improved strength, mobility, and stability for decreased falls risk and improved independent mobility    Personal Factors and Comorbidities  Age;Comorbidity 3+;Fitness;Sex;Social Background;Past/Current Experience;Transportation    Comorbidities  R breast CA with history of radiation and partial mastectomy (2018), DM type II, HTN, thyroid surgery, HLD, hypothyroidism.    Examination-Activity Limitations   Bathing;Bend;Caring for Others;Dressing;Stairs;Squat;Locomotion Level;Stand;Lift;Transfers    Examination-Participation Restrictions  Cleaning;Church;Community Activity;Interpersonal Relationship;Laundry;Volunteer;Shop;Personal Finances;Meal Prep;Yard Work    [employer]  Evolving/Moderate complexity    Rehab Potential  Fair    PT Frequency  2x / week    PT Duration  8 weeks    PT Treatment/Interventions  ADLs/Self Care Home Management;Aquatic Therapy;Cryotherapy;Electrical Stimulation;Canalith Repostioning;Biofeedback;Moist Heat;Traction;Ultrasound;DME Instruction;Gait training;Stair training;Functional mobility training;Therapeutic activities;Patient/family education;Cognitive remediation;Neuromuscular re-education;Balance training;Therapeutic exercise;Orthotic Fit/Training;Manual techniques;Energy conservation;Passive range of motion;Taping;Vestibular    PT Next Visit Plan  weight shift/balance on RLE, foot clearance RLE, progress static and dynamic balance    Consulted and Agree with Plan of Care  Patient       Patient will benefit from skilled therapeutic intervention in order to improve the following deficits and impairments:  Abnormal gait, Cardiopulmonary status limiting activity, Decreased activity tolerance, Decreased balance, Decreased knowledge of precautions, Decreased endurance, Decreased coordination, Decreased cognition, Decreased knowledge of use of DME, Decreased mobility, Decreased safety awareness, Difficulty walking, Decreased strength, Impaired flexibility, Impaired perceived functional ability, Postural dysfunction, Improper body mechanics  Visit Diagnosis: Muscle weakness (generalized)  Unsteadiness on feet  Other abnormalities of gait and mobility  Other lack of coordination     Problem List Patient Active Problem List   Diagnosis Date Noted  . TIA (transient ischemic attack) 12/10/2018  . Primary cancer of upper inner quadrant of right  female breast ([place]) 12/25/2016   Janna Arch, PT, DPT   02/04/2019, 10:09 AM  [place] MAIN [facility] SERVICES [address] [place], [place], [address] Phone: [phone]   Fax:  [phone]  Name: Tagen Ingoldsby MRN: [id number] Date of Birth: 08/22/1940

## 2019-02-04 NOTE — Therapy (Signed)
[place] MAIN [facility] SERVICES [address] [place], [place], [address] Phone: [phone]   Fax:  [phone]  Speech Language Pathology Treatment  Patient Details  Name: Yaara Teuscher MRN: [id number] Date of Birth: Jul 10, 1940 Referring Provider (SLP): Jari Pigg   Encounter Date: 02/04/2019  End of Session - 02/04/19 1122    Visit Number  5    Number of Visits  16    Date for SLP Re-Evaluation  03/13/19    Authorization Type  Medicare    Authorization Time Period  Start 01/16/2019    Authorization - Visit Number  5    Authorization - Number of Visits  10    SLP Start Time  0900    SLP Stop Time   0950    SLP Time Calculation (min)  50 min    Activity Tolerance  Patient tolerated treatment well       Past Medical History:  Diagnosis Date  . Borderline diabetes   . Breast cancer, right ([place]) 11/2016   invasive mammary carcinoma, Lumpectomy and rad tx's.   . Colon polyp   . Diabetes mellitus without complication ([place])    type 2  . Hyperlipemia   . Hypertension   . Hyperthyroidism   . Hypothyroidism   . Insomnia   . Osteopenia   . Personal history of radiation therapy 2018   right breast cancer    Past Surgical History:  Procedure Laterality Date  . ABDOMINAL HYSTERECTOMY     oophorectomy  . APPENDECTOMY    . BREAST BIOPSY Left    neg core  . BREAST BIOPSY Right    neg  core  . BREAST BIOPSY Right 11/2016   invasive mammary carcinoma, [place]  . BREAST BIOPSY Right 11/2016   benign, stero  . BREAST LUMPECTOMY Right 12/2016   invasive mammary carcinoma  Negative margins  . CATARACT EXTRACTION W/ INTRAOCULAR LENS IMPLANT    . CHOLECYSTECTOMY    . COLONOSCOPY    . COLONOSCOPY WITH PROPOFOL N/A 01/15/2018   Procedure: COLONOSCOPY WITH PROPOFOL;  Surgeon: Toledo, Benay Pike, MD;  Location: [facility] ENDOSCOPY;  Service: Gastroenterology;  Laterality: N/A;  . EYE SURGERY     bilateral cataracts  . OOPHORECTOMY    . PARTIAL  MASTECTOMY WITH NEEDLE LOCALIZATION Right 01/04/2017   Procedure: PARTIAL MASTECTOMY WITH NEEDLE LOCALIZATION;  Surgeon: Leonie Green, MD;  Location: [facility] ORS;  Service: General;  Laterality: Right;  . SENTINEL NODE BIOPSY Right 01/04/2017   Procedure: SENTINEL NODE BIOPSY;  Surgeon: Leonie Green, MD;  Location: [facility] ORS;  Service: General;  Laterality: Right;  . THYROID SURGERY     total thyroidectomy  . TONSILLECTOMY    . TOTAL THYROIDECTOMY      There were no vitals filed for this visit.  Subjective Assessment - 02/04/19 1121    Subjective  The patient was alert, cooperative, and pleasant throughout the therapy session. She shared that her new neighbor has been very helpful with cooking meals for her.            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 MBS was completed during acute hospitalization, with recommendation for Dys 2 solids and honey thick liquids.      Treatment Provided  Treatment provided  Cognitive-Linquistic      Dysphagia Treatment   Other treatment/comments  Recall of safe swallow strategies      Cognitive-Linquistic Treatment   Treatment focused on  Aphasia    Skilled Treatment  DYSPHAGIA: Patient able to recall safe swallowing strategies with 100% accuracy independently. She reported continued overall success with implementation of safe swallowing precautions during oral intake at home. She has fully transitioned to eating all meals at the kitchen table (instead of in the living room while watching television). Patient reported attempting to incorporate more liquid intake at mealtimes for alternation of solid and liquid boluses but stated that she still prefers not to drink anything until the end of meals. Patient  shared that when her son visits, he provides helpful reminders for her not to talk until she has finished swallowing. WORD RETRIEVAL: Patient named at least 2 members of concrete categories with 80% accuracy without interventions. Given semantic cueing, accuracy increased to 90%. Patient named items in a given category starting with a particular letter with 92% accuracy independently. Given semantic cueing, accuracy improved to 96%. Patient noted with delayed response times and articulatory imprecision across therapy tasks. Given minimal prompting, patient was able to identify that increased vocal intensity and overarticulation improve her overall intelligibility.      Assessment / Recommendations / Plan   Plan  Continue with current plan of care      Progression Toward Goals   Progression toward goals  Progressing toward goals       SLP Education - 02/04/19 1121    Education Details  Instead of "trailing off" when you lose your train of thought, try pausing and starting over again.    Person(s) Educated  Patient    Methods  Explanation;Demonstration    Comprehension  Verbalized understanding;Returned demonstration         SLP Long Term Goals - 01/16/19 1246      SLP LONG TERM GOAL #1   Title  Pt will complete word retrieval tasks of increasing complexity with 80% accuracy given min assist    Time  8    Period  Weeks    Status  New    Target Date  03/13/19      SLP LONG TERM GOAL #2   Title  Pt will verbalize awareness of and adherence to safe swallow strategies to maximize safe po intake.    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 She verbalizes understanding that divided attention is more difficult for her brain now following her stroke and is gradually transitioning to new habits of focusing on one task at a time.  Visual aid provided for facilitating recall of safe swallow strategies now implemented in patient's home environment, with reported overall improvement in use of precautions. Patient stimulable for word finding given minimal semantic/phonemic cueing, though she quickly becomes frustrated when she experiences difficulty completing complex tasks. Will continue with ST treatment to maximize safe oral intake and word retrieval abilities.    Speech Therapy Frequency  2x / week    Duration  Other (comment)    Treatment/Interventions  Aspiration precaution training;Pharyngeal strengthening exercises;Diet toleration management by SLP;Internal/external aids;Cueing hierarchy;Compensatory techniques;Language facilitation;Functional tasks;SLP instruction and feedback;Patient/family education;Compensatory strategies;Multimodal communcation approach  Potential to Achieve Goals  Good    Potential Considerations  Ability to learn/carryover information;Cooperation/participation level;Previous level of function;Family/community support    SLP Home Exercise Plan  Provided       Patient will benefit from skilled therapeutic intervention in order to improve the following deficits and impairments:   Pharyngeal dysphagia  Aphasia    Problem List Patient Active Problem List   Diagnosis Date Noted  . TIA (transient ischemic attack) 12/10/2018  . Primary cancer of upper inner quadrant of right female breast ([place]) 12/25/2016   Enzley Kitchens A. Francis Dowse., Graduate Clinician Vella Kohler 02/04/2019, 11:23 AM  [place] MAIN [facility] SERVICES [address] [place], [place], [address] Phone: [phone]   Fax:  [phone]   Name: Sherryl Ambrosi MRN: [id number] Date of Birth: [birth year]

## 2019-02-09 ENCOUNTER — Ambulatory Visit: Payer: Medicare Other | Attending: Physical Medicine and Rehabilitation

## 2019-02-09 ENCOUNTER — Other Ambulatory Visit: Payer: Self-pay

## 2019-02-09 ENCOUNTER — Encounter: Payer: Self-pay | Admitting: Speech Pathology

## 2019-02-09 ENCOUNTER — Ambulatory Visit: Payer: Medicare Other | Admitting: Speech Pathology

## 2019-02-09 DIAGNOSIS — M6281 Muscle weakness (generalized): Secondary | ICD-10-CM | POA: Diagnosis not present

## 2019-02-09 DIAGNOSIS — R2689 Other abnormalities of gait and mobility: Secondary | ICD-10-CM | POA: Diagnosis present

## 2019-02-09 DIAGNOSIS — I69351 Hemiplegia and hemiparesis following cerebral infarction affecting right dominant side: Secondary | ICD-10-CM | POA: Insufficient documentation

## 2019-02-09 DIAGNOSIS — R1313 Dysphagia, pharyngeal phase: Secondary | ICD-10-CM

## 2019-02-09 DIAGNOSIS — R2681 Unsteadiness on feet: Secondary | ICD-10-CM | POA: Insufficient documentation

## 2019-02-09 DIAGNOSIS — R4701 Aphasia: Secondary | ICD-10-CM

## 2019-02-09 DIAGNOSIS — R278 Other lack of coordination: Secondary | ICD-10-CM | POA: Diagnosis present

## 2019-02-09 NOTE — Therapy (Signed)
[place] MAIN [facility] SERVICES [address] [place], [place], [address] Phone: [phone]   Fax:  [phone]  Speech Language Pathology Treatment  Patient Details  Name: Meghan Dougherty MRN: [id number] Date of Birth: 04-11-1940 Referring Provider (SLP): Jari Pigg   Encounter Date: 02/09/2019  End of Session - 02/09/19 1036    Visit Number  6    Number of Visits  16    Date for SLP Re-Evaluation  03/13/19    Authorization Type  Medicare    Authorization Time Period  Start 01/16/2019    Authorization - Visit Number  6    Authorization - Number of Visits  10    SLP Start Time  0900    SLP Stop Time   0950    SLP Time Calculation (min)  50 min    Activity Tolerance  Patient tolerated treatment well       Past Medical History:  Diagnosis Date  . Borderline diabetes   . Breast cancer, right ([place]) 11/2016   invasive mammary carcinoma, Lumpectomy and rad tx's.   . Colon polyp   . Diabetes mellitus without complication ([place])    type 2  . Hyperlipemia   . Hypertension   . Hyperthyroidism   . Hypothyroidism   . Insomnia   . Osteopenia   . Personal history of radiation therapy 2018   right breast cancer    Past Surgical History:  Procedure Laterality Date  . ABDOMINAL HYSTERECTOMY     oophorectomy  . APPENDECTOMY    . BREAST BIOPSY Left    neg core  . BREAST BIOPSY Right    neg  core  . BREAST BIOPSY Right 11/2016   invasive mammary carcinoma, [place]  . BREAST BIOPSY Right 11/2016   benign, stero  . BREAST LUMPECTOMY Right 12/2016   invasive mammary carcinoma  Negative margins  . CATARACT EXTRACTION W/ INTRAOCULAR LENS IMPLANT    . CHOLECYSTECTOMY    . COLONOSCOPY    . COLONOSCOPY WITH PROPOFOL N/A 01/15/2018   Procedure: COLONOSCOPY WITH PROPOFOL;  Surgeon: Toledo, Benay Pike, MD;  Location: [facility] ENDOSCOPY;  Service: Gastroenterology;  Laterality: N/A;  . EYE SURGERY     bilateral cataracts  . OOPHORECTOMY    . PARTIAL  MASTECTOMY WITH NEEDLE LOCALIZATION Right 01/04/2017   Procedure: PARTIAL MASTECTOMY WITH NEEDLE LOCALIZATION;  Surgeon: Leonie Green, MD;  Location: [facility] ORS;  Service: General;  Laterality: Right;  . SENTINEL NODE BIOPSY Right 01/04/2017   Procedure: SENTINEL NODE BIOPSY;  Surgeon: Leonie Green, MD;  Location: [facility] ORS;  Service: General;  Laterality: Right;  . THYROID SURGERY     total thyroidectomy  . TONSILLECTOMY    . TOTAL THYROIDECTOMY      There were no vitals filed for this visit.  Subjective Assessment - 02/09/19 1035    Subjective  The patient was alert, cooperative, and pleasant throughout the therapy session. She reported experiencing leg and back pain over the weekend.            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 MBS was completed during acute hospitalization, with recommendation for Dys 2 solids and honey thick liquids.      Treatment Provided   Treatment provided  Cognitive-Linquistic  Dysphagia Treatment   Other treatment/comments  Recall of safe swallow strategies      Cognitive-Linquistic Treatment   Treatment focused on  Aphasia    Skilled Treatment  DYSPHAGIA: Patient able to recall safe swallowing strategies with 100% accuracy independently. She reported continued overall success with implementation of safe swallowing precautions during oral intake at home. She has fully transitioned to eating all meals at the kitchen table (instead of in the living room while watching television). Patient reported gradual progress with increasing liquid intake at mealtimes for alternation of solid and liquid boluses, stating that she "remembers every few bites", which is improvement relative to only drinking at the end of  meals. WORD RETRIEVAL: Patient listed items in a given category under time pressure with 65% accuracy without interventions. Given additional time and semantic cueing, accuracy increased to 87%. Patient stated concrete categories given 3 members with 92% accuracy independently. Given cueing for specificity, accuracy improved to 100%. Patient identified words that did not belong in abstract categories from 5 choices with 70% accuracy. Given corrective feedback and verbal cueing, accuracy increased to 80%. Patient noted with slow information processing and difficulty sustaining attention during this complex task.       Assessment / Recommendations / Plan   Plan  Continue with current plan of care      Progression Toward Goals   Progression toward goals  Progressing toward goals       SLP Education - 02/09/19 1035    Education Details  Discussion of patient's difficulty sustaining attention during complex linguistic tasks.    Person(s) Educated  Patient    Methods  Explanation    Comprehension  Verbalized understanding         SLP Long Term Goals - 01/16/19 1246      SLP LONG TERM GOAL #1   Title  Pt will complete word retrieval tasks of increasing complexity with 80% accuracy given min assist    Time  8    Period  Weeks    Status  New    Target Date  03/13/19      SLP LONG TERM GOAL #2   Title  Pt will verbalize awareness of and adherence to safe swallow strategies to maximize safe po intake.    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 She verbalizes understanding that divided attention is more difficult for her brain now following her stroke and demonstrates progress with transitioning to new habits of focusing on one task at a time. Visual aid provided for facilitating recall of safe swallow strategies now implemented in  patient's home environment, with reported overall improvement in use of precautions. Patient is stimulable for word finding given minimal semantic/phonemic cueing. She exhibits slow information processing and delayed response times as task complexity increases. Patient has difficulty sustaining attention during complex tasks and is easily frustrated. Patient would benefit from increased independence with compensatory strategy use. Will continue with ST treatment to maximize safe oral intake and word retrieval abilities.    Speech Therapy Frequency  2x / week    Duration  Other (comment)    Treatment/Interventions  Aspiration precaution training;Pharyngeal strengthening exercises;Diet toleration management by SLP;Internal/external aids;Cueing hierarchy;Compensatory techniques;Language facilitation;Functional tasks;SLP instruction and feedback;Patient/family education;Compensatory strategies;Multimodal communcation approach  Potential to Achieve Goals  Good    Potential Considerations  Ability to learn/carryover information;Cooperation/participation level;Previous level of function;Family/community support    SLP Home Exercise Plan  Provided    Consulted and Agree with Plan of Care  Patient       Patient will benefit from skilled therapeutic intervention in order to improve the following deficits and impairments:   Pharyngeal dysphagia  Aphasia    Problem List Patient Active Problem List   Diagnosis Date Noted  . TIA (transient ischemic attack) 12/10/2018  . Primary cancer of upper inner quadrant of right female breast ([place]) 12/25/2016   Kayven Aldaco A. Francis Dowse., Graduate Clinician Vella Kohler 02/09/2019, 10:37 AM  [place] MAIN [facility] SERVICES [address] [place], [place], [address] Phone: [phone]   Fax:  [phone]   Name: Meghan Dougherty MRN: [id number] Date of Birth: Jun 18, 1940

## 2019-02-09 NOTE — Therapy (Signed)
[place] MAIN [facility] SERVICES [address] [place], [place], [address] Phone: [phone]   Fax:  [phone]  Physical Therapy Treatment  Patient Details  Name: Meghan Dougherty MRN: [id number] Date of Birth: 01/06/1941 Referring Provider (PT): Doran Clay   Encounter Date: 02/09/2019  PT End of Session - 02/09/19 0759    Visit Number  6    Number of Visits  16    Date for PT Re-Evaluation  03/16/19    Authorization Type  6/10 eval 01/19/19    PT Start Time  0800    PT Stop Time  0844    PT Time Calculation (min)  44 min    Equipment Utilized During Treatment  Gait belt    Activity Tolerance  Patient tolerated treatment well    Behavior During Therapy  [facility] for tasks assessed/performed;Anxious;Flat affect       Past Medical History:  Diagnosis Date  . Borderline diabetes   . Breast cancer, right ([place]) 11/2016   invasive mammary carcinoma, Lumpectomy and rad tx's.   . Colon polyp   . Diabetes mellitus without complication ([place])    type 2  . Hyperlipemia   . Hypertension   . Hyperthyroidism   . Hypothyroidism   . Insomnia   . Osteopenia   . Personal history of radiation therapy 2018   right breast cancer    Past Surgical History:  Procedure Laterality Date  . ABDOMINAL HYSTERECTOMY     oophorectomy  . APPENDECTOMY    . BREAST BIOPSY Left    neg core  . BREAST BIOPSY Right    neg  core  . BREAST BIOPSY Right 11/2016   invasive mammary carcinoma, [place]  . BREAST BIOPSY Right 11/2016   benign, stero  . BREAST LUMPECTOMY Right 12/2016   invasive mammary carcinoma  Negative margins  . CATARACT EXTRACTION W/ INTRAOCULAR LENS IMPLANT    . CHOLECYSTECTOMY    . COLONOSCOPY    . COLONOSCOPY WITH PROPOFOL N/A 01/15/2018   Procedure: COLONOSCOPY WITH PROPOFOL;  Surgeon: Toledo, Benay Pike, MD;  Location: [facility] ENDOSCOPY;  Service: Gastroenterology;  Laterality: N/A;  . EYE SURGERY     bilateral cataracts  . OOPHORECTOMY     . PARTIAL MASTECTOMY WITH NEEDLE LOCALIZATION Right 01/04/2017   Procedure: PARTIAL MASTECTOMY WITH NEEDLE LOCALIZATION;  Surgeon: Leonie Green, MD;  Location: [facility] ORS;  Service: General;  Laterality: Right;  . SENTINEL NODE BIOPSY Right 01/04/2017   Procedure: SENTINEL NODE BIOPSY;  Surgeon: Leonie Green, MD;  Location: [facility] ORS;  Service: General;  Laterality: Right;  . THYROID SURGERY     total thyroidectomy  . TONSILLECTOMY    . TOTAL THYROIDECTOMY      There were no vitals filed for this visit.  Subjective Assessment - 02/09/19 0803    Subjective  Patient reported that her L knee has been very painful, and that she needs to call her doctor today to schedule getting her cortizone injection. Stated it would be her 4th shot.    Pertinent History  Patient admitted to the hospital on 12/10/18 due to having slurred speech and then a fall when her R knee gave out. She was noted to have R facial droop, R sided weakness, expressive aphasia, and dysarthria. 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 PMH includes R breast CA with history of radiation and partial mastectomy (2018), DM type II, HTN, thyroid surgery, HLD,  hypothyroidism. While in the hospital was able to walk with walker. Patient was then d/c on 12/19/18 to [facility] where she was discharged 01/07/19    How long can you sit comfortably?  all day    How long can you stand comfortably?  unsteady as soon as standing, reports about an hour    How long can you walk comfortably?  needs walker    Patient Stated Goals  to walk without a walker    Currently in Pain?  Yes    Pain Score  6    with nustep activity   Pain Location  Knee    Pain Orientation  Left    Pain Descriptors / Indicators  Sharp    Pain Type  Chronic pain                               PT Education - 02/09/19 0759    Education Details  exercise  technique/form    Person(s) Educated  Patient    Methods  Explanation;Demonstration;Tactile cues;Verbal cues    Comprehension  Verbalized understanding;Returned demonstration;Verbal cues required;Tactile cues required;Need further instruction       PT Short Term Goals - 01/19/19 1305      PT SHORT TERM GOAL #1   Title  Patient will be independent in home exercise program to improve strength/mobility for better functional independence with ADLs.    Baseline  10/12: HEP given    Time  2    Period  Weeks    Status  New    Target Date  02/02/19        PT Long Term Goals - 01/19/19 1306      PT LONG TERM GOAL #1   Title  Patient will increase Berg Balance score by > 6 points (45/56) to demonstrate decreased fall risk during functional activities.    Baseline  10/12: 39/56    Time  8    Period  Weeks    Status  New    Target Date  03/16/19      PT LONG TERM GOAL #2   Title  Patient will increase 10 meter walk test to >1.35m/s as to improve gait speed for better community ambulation and to reduce fall risk.    Baseline  10/12=0.71 m/s    Time  8    Period  Weeks    Status  New    Target Date  03/16/19      PT LONG TERM GOAL #3   Title  Patient will increase ABC scale score >80% to demonstrate better functional mobility and better confidence with ADLs.    Baseline  10/12: 42.5%    Time  8    Period  Weeks    Status  New    Target Date  03/16/19      PT LONG TERM GOAL #4   Title  Patient will ambulate without an AD for > 500 ft for return to PLOF with improved stability with prolonged mobility.    Baseline  10/12: requires use of walker    Time  8    Period  Weeks    Status  New    Target Date  03/16/19          Treatment:    Nustep Lvl 3 RPM> 60 for cardiovascular challenge, 3 minutes   With treadmill support (SUE)  -hedgehog taps, SUE support, challenging to RUE  2x10  -lateral stepping over hurdle 2x10 cues for foot/toe clearance, step length  -forward  stepping over hurdle x10 with ea LE leading,  Standing with # 2lb  ankle weight: CGA for stability   -Hip extension with bilateral upper extremity support, cueing for neutral hip alignment, upright posture for optimal muscle recruitment, and sequencing, x10 each LE,    Standing knee flexion x10   Seated with # 2lb ankle weights    -Seated marches with upright posture, back away from back of chair for abdominal/trunk activation/stabilization, 2x10 each LE   -Seated LAQ with 3 second holds, 2x10 each LE, cueing for muscle activation and sequencing for neutral alignment   Seated hip abduction GTB 2x15   Seated hip adduction 2x15  -Seated IR/ER with cueing for stabilizing knee placement with lateral foot movement for optimal muscle recruitment, 10x each LE   Pt educated throughout session about proper posture and technique with exercises. Improved exercise technique, movement at target joints, use of target muscles after min to mod verbal, visual, tactile cues   Pt response/clinical impression: Program initially modified for L knee pain that patient reported increased with weight bearing activities. The patient stated she is willing to try standing exercises today and her pain only occurs with twisting or strenuous activities. The patient was able to perform remaining exercises without complaints of pain, as well as single UE support. Intermittent cues to increase step length and attend to task needed. The patient would benefit from continued skilled PT intervention to progress towards goals.   Plan - 02/09/19 0805    Clinical Impression Statement  Program initially modified for L knee pain that patient reported increased with weight bearing activities. The patient stated she is willing to try standing exercises today and her pain only occurs with twisting or strenuous activities. The patient was able to perform remaining exercises without complaints of pain, as well as single UE support.  Intermittent cues to increase step length and attend to task needed. The patient would benefit from continued skilled PT intervention to progress towards goals.    Personal Factors and Comorbidities  Age;Comorbidity 3+;Fitness;Sex;Social Background;Past/Current Experience;Transportation    Comorbidities  R breast CA with history of radiation and partial mastectomy (2018), DM type II, HTN, thyroid surgery, HLD, hypothyroidism.    Examination-Activity Limitations  Bathing;Bend;Caring for Others;Dressing;Stairs;Squat;Locomotion Level;Stand;Lift;Transfers    Examination-Participation Restrictions  Cleaning;Church;Community Activity;Interpersonal Relationship;Laundry;Volunteer;Shop;Personal Finances;Meal Prep;Yard Work    [employer] Potential  Fair    PT Frequency  2x / week    PT Duration  8 weeks    PT Treatment/Interventions  ADLs/Self Care Home Management;Aquatic Therapy;Cryotherapy;Electrical Stimulation;Canalith Repostioning;Biofeedback;Moist Heat;Traction;Ultrasound;DME Instruction;Gait training;Stair training;Functional mobility training;Therapeutic activities;Patient/family education;Cognitive remediation;Neuromuscular re-education;Balance training;Therapeutic exercise;Orthotic Fit/Training;Manual techniques;Energy conservation;Passive range of motion;Taping;Vestibular    PT Next Visit Plan  weight shift/balance on RLE, foot clearance RLE, progress static and dynamic balance    PT Home Exercise Plan  see above    Consulted and Agree with Plan of Care  Patient       Patient will benefit from skilled therapeutic intervention in order to improve the following deficits and impairments:  Abnormal gait, Cardiopulmonary status limiting activity, Decreased activity tolerance, Decreased balance, Decreased knowledge of precautions, Decreased endurance, Decreased coordination, Decreased cognition, Decreased knowledge of use of DME, Decreased mobility, Decreased safety awareness, Difficulty walking, Decreased  strength, Impaired flexibility, Impaired perceived functional ability, Postural dysfunction, Improper body mechanics  Visit Diagnosis: Muscle weakness (generalized)  Unsteadiness on feet  Hemiplegia and hemiparesis following cerebral infarction affecting right  dominant side ([place])  Other abnormalities of gait and mobility     Problem List Patient Active Problem List   Diagnosis Date Noted  . TIA (transient ischemic attack) 12/10/2018  . Primary cancer of upper inner quadrant of right female breast ([place]) 12/25/2016    Lieutenant Diego PT, DPT 8:54 AM,02/09/19 [place] MAIN [facility] SERVICES [address] [place], [place], [address] Phone: [phone]   Fax:  [phone]  Name: Nehal Koleszar MRN: [id number] Date of Birth: 27-Aug-1940

## 2019-02-12 ENCOUNTER — Ambulatory Visit: Payer: Medicare Other

## 2019-02-12 ENCOUNTER — Encounter: Payer: Self-pay | Admitting: Occupational Therapy

## 2019-02-12 ENCOUNTER — Encounter: Payer: Self-pay | Admitting: Speech Pathology

## 2019-02-12 ENCOUNTER — Other Ambulatory Visit: Payer: Self-pay

## 2019-02-12 ENCOUNTER — Ambulatory Visit: Payer: Medicare Other | Admitting: Speech Pathology

## 2019-02-12 ENCOUNTER — Ambulatory Visit: Payer: Medicare Other | Admitting: Occupational Therapy

## 2019-02-12 DIAGNOSIS — R1313 Dysphagia, pharyngeal phase: Secondary | ICD-10-CM

## 2019-02-12 DIAGNOSIS — I69351 Hemiplegia and hemiparesis following cerebral infarction affecting right dominant side: Secondary | ICD-10-CM

## 2019-02-12 DIAGNOSIS — R2681 Unsteadiness on feet: Secondary | ICD-10-CM

## 2019-02-12 DIAGNOSIS — M6281 Muscle weakness (generalized): Secondary | ICD-10-CM | POA: Diagnosis not present

## 2019-02-12 DIAGNOSIS — R4701 Aphasia: Secondary | ICD-10-CM

## 2019-02-12 DIAGNOSIS — R278 Other lack of coordination: Secondary | ICD-10-CM

## 2019-02-12 NOTE — Therapy (Signed)
[place] MAIN [facility] SERVICES [address] [place], [place], [address] Phone: [phone]   Fax:  [phone]  Speech Language Pathology Treatment  Patient Details  Name: Meghan Dougherty MRN: [id number] Date of Birth: [birth year] Referring Provider (SLP): Jari Pigg   Encounter Date: 02/12/2019  End of Session - 02/12/19 1111    Visit Number  7    Number of Visits  16    Date for SLP Re-Evaluation  03/13/19    Authorization Type  Medicare    Authorization Time Period  Start 01/16/2019    Authorization - Visit Number  7    Authorization - Number of Visits  10    SLP Start Time  0900    SLP Stop Time   0950    SLP Time Calculation (min)  50 min    Activity Tolerance  Patient tolerated treatment well       Past Medical History:  Diagnosis Date  . Borderline diabetes   . Breast cancer, right ([place]) 11/2016   invasive mammary carcinoma, Lumpectomy and rad tx's.   . Colon polyp   . Diabetes mellitus without complication ([place])    type 2  . Hyperlipemia   . Hypertension   . Hyperthyroidism   . Hypothyroidism   . Insomnia   . Osteopenia   . Personal history of radiation therapy 2018   right breast cancer    Past Surgical History:  Procedure Laterality Date  . ABDOMINAL HYSTERECTOMY     oophorectomy  . APPENDECTOMY    . BREAST BIOPSY Left    neg core  . BREAST BIOPSY Right    neg  core  . BREAST BIOPSY Right 11/2016   invasive mammary carcinoma, [place]  . BREAST BIOPSY Right 11/2016   benign, stero  . BREAST LUMPECTOMY Right 12/2016   invasive mammary carcinoma  Negative margins  . CATARACT EXTRACTION W/ INTRAOCULAR LENS IMPLANT    . CHOLECYSTECTOMY    . COLONOSCOPY    . COLONOSCOPY WITH PROPOFOL N/A 01/15/2018   Procedure: COLONOSCOPY WITH PROPOFOL;  Surgeon: Toledo, Benay Pike, MD;  Location: [facility];  Service: Gastroenterology;  Laterality: N/A;  . EYE SURGERY     bilateral cataracts  . OOPHORECTOMY    . PARTIAL  MASTECTOMY WITH NEEDLE LOCALIZATION Right 01/04/2017   Procedure: PARTIAL MASTECTOMY WITH NEEDLE LOCALIZATION;  Surgeon: Leonie Green, MD;  Location: [facility];  Service: General;  Laterality: Right;  . SENTINEL NODE BIOPSY Right 01/04/2017   Procedure: SENTINEL NODE BIOPSY;  Surgeon: Leonie Green, MD;  Location: [facility] ORS;  Service: General;  Laterality: Right;  . THYROID SURGERY     total thyroidectomy  . TONSILLECTOMY    . TOTAL THYROIDECTOMY      There were no vitals filed for this visit.  Subjective Assessment - 02/12/19 1109    Subjective  The patient was alert, cooperative, and pleasant throughout the therapy session. She forgot her reading glasses today but was able to borrow a pair.            ADULT SLP TREATMENT - 02/12/19 0001      General Information   Behavior/Cognition  Alert;Cooperative;Pleasant mood    HPI  78 year old female referred for outpatient speech therapy after CVA in September 2020. 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 MBS was completed during acute hospitalization, with recommendation for Dys 2 solids and honey thick liquids.      Treatment Provided   Treatment  provided  Cognitive-Linquistic      Cognitive-Linquistic Treatment   Treatment focused on  Aphasia    Skilled Treatment  DYSPHAGIA: Patient reported continued overall success with implementation of safe swallowing precautions during oral intake at home. She has fully transitioned to eating all meals at the kitchen table (instead of in the living room while watching television). Patient reported continued improvement with increasing liquid intake at mealtimes (patient prefers waiting until the end of meals to drink) for alternation of solid and liquid boluses, stating that she "is getting used to it". Patient reported no coughing or throat clearing induced by oral intake. WORD RETRIEVAL: Patient named at least 2 items in abstract categories with 75% accuracy without  interventions. Given semantic cueing, accuracy increased to 100%. Patient selected phrases to complete sentences from 3 choices with 100% accuracy, given minimal cueing for attention to oral reading errors (omissions, additions, and substitutions noted). Patient generated spontaneous responses to complete sentences with 75% accuracy. Given semantic cueing, accuracy increased to 90%.       Assessment / Recommendations / Plan   Plan  Continue with current plan of care      Progression Toward Goals   Progression toward goals  Progressing toward goals       SLP Education - 02/12/19 1110    Education Details  Slow down and read each word in the sentence. You can easily miss key words when you go too fast.    Person(s) Educated  Patient    Methods  Explanation;Demonstration    Comprehension  Verbalized understanding;Returned demonstration         SLP Long Term Goals - 01/16/19 1246      SLP LONG TERM GOAL #1   Title  Pt will complete word retrieval tasks of increasing complexity with 80% accuracy given min assist    Time  8    Period  Weeks    Status  New    Target Date  03/13/19      SLP LONG TERM GOAL #2   Title  Pt will verbalize awareness of and adherence to safe swallow strategies to maximize safe po intake.    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 She verbalizes understanding that divided attention is more difficult for her brain now post-stroke and demonstrates progress with transitioning to new habits of focusing on one task at a time. Visual aid provided for facilitating recall of safe swallow strategies now implemented in patient's home environment, with reported overall improvement in use of precautions. Patient is stimulable for word finding given minimal cueing. She has difficulty sustaining attention as task  complexity increases and is easily frustrated. Patient would benefit from increased independence with compensatory strategy use. Will continue with ST treatment to maximize safe oral intake and word retrieval abilities.    Speech Therapy Frequency  2x / week    Duration  Other (comment)    Treatment/Interventions  Aspiration precaution training;Pharyngeal strengthening exercises;Diet toleration management by SLP;Internal/external aids;Cueing hierarchy;Compensatory techniques;Language facilitation;Functional tasks;SLP instruction and feedback;Patient/family education;Compensatory strategies;Multimodal communcation approach    Potential to Achieve Goals  Good    Potential Considerations  Ability to learn/carryover information;Cooperation/participation level;Previous level of function;Family/community support    SLP Home Exercise Plan  Provided  Consulted and Agree with Plan of Care  Patient       Patient will benefit from skilled therapeutic intervention in order to improve the following deficits and impairments:   Pharyngeal dysphagia  Aphasia    Problem List Patient Active Problem List   Diagnosis Date Noted  . TIA (transient ischemic attack) 12/10/2018  . Primary cancer of upper inner quadrant of right female breast ([place]) 12/25/2016   Meghanne Pletz A. Francis Dowse., Graduate Clinician Vella Kohler 02/12/2019, 11:12 AM  [place] MAIN [facility] SERVICES [address] [place], [place], [address] Phone: [phone]   Fax:  [phone]   Name: Nyliah Repko MRN: [id number] Date of Birth: Jun 18, 1940

## 2019-02-12 NOTE — Therapy (Signed)
[place] MAIN [facility] SERVICES [address] [place], [place], [address] Phone: [phone]   Fax:  [phone]  Physical Therapy Treatment  Patient Details  Name: Margrete Pinyan MRN: [id number] Date of Birth: 07/21/1940 Referring Provider (PT): Doran Clay   Encounter Date: 02/12/2019  PT End of Session - 02/12/19 0754    Visit Number  7    Number of Visits  16    Date for PT Re-Evaluation  03/16/19    Authorization Type  7/10 eval 01/19/19    PT Start Time  0759    PT Stop Time  0844    PT Time Calculation (min)  45 min    Equipment Utilized During Treatment  Gait belt    Activity Tolerance  Patient tolerated treatment well    Behavior During Therapy  [facility] for tasks assessed/performed;Anxious;Flat affect       Past Medical History:  Diagnosis Date  . Borderline diabetes   . Breast cancer, right ([place]) 11/2016   invasive mammary carcinoma, Lumpectomy and rad tx's.   . Colon polyp   . Diabetes mellitus without complication ([place])    type 2  . Hyperlipemia   . Hypertension   . Hyperthyroidism   . Hypothyroidism   . Insomnia   . Osteopenia   . Personal history of radiation therapy 2018   right breast cancer    Past Surgical History:  Procedure Laterality Date  . ABDOMINAL HYSTERECTOMY     oophorectomy  . APPENDECTOMY    . BREAST BIOPSY Left    neg core  . BREAST BIOPSY Right    neg  core  . BREAST BIOPSY Right 11/2016   invasive mammary carcinoma, [place]  . BREAST BIOPSY Right 11/2016   benign, stero  . BREAST LUMPECTOMY Right 12/2016   invasive mammary carcinoma  Negative margins  . CATARACT EXTRACTION W/ INTRAOCULAR LENS IMPLANT    . CHOLECYSTECTOMY    . COLONOSCOPY    . COLONOSCOPY WITH PROPOFOL N/A 01/15/2018   Procedure: COLONOSCOPY WITH PROPOFOL;  Surgeon: Toledo, Benay Pike, MD;  Location: [facility];  Service: Gastroenterology;  Laterality: N/A;  . EYE SURGERY     bilateral cataracts  . OOPHORECTOMY     . PARTIAL MASTECTOMY WITH NEEDLE LOCALIZATION Right 01/04/2017   Procedure: PARTIAL MASTECTOMY WITH NEEDLE LOCALIZATION;  Surgeon: Leonie Green, MD;  Location: [facility];  Service: General;  Laterality: Right;  . SENTINEL NODE BIOPSY Right 01/04/2017   Procedure: SENTINEL NODE BIOPSY;  Surgeon: Leonie Green, MD;  Location: [facility] ORS;  Service: General;  Laterality: Right;  . THYROID SURGERY     total thyroidectomy  . TONSILLECTOMY    . TOTAL THYROIDECTOMY      There were no vitals filed for this visit.  Subjective Assessment - 02/12/19 0801    Subjective  Patient reports she is getting an injection in her L knee next week. No falls since last session.    Pertinent History  Patient admitted to the hospital on 12/10/18 due to having slurred speech and then a fall when her R knee gave out. She was noted to have R facial droop, R sided weakness, expressive aphasia, and dysarthria. 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 PMH includes R breast CA with history of radiation and partial mastectomy (2018), DM type II, HTN, thyroid surgery, HLD, hypothyroidism. While in the hospital was able to walk with walker. Patient was then  d/c on 12/19/18 to [place] rehab where she was discharged 01/07/19    How long can you sit comfortably?  all day    How long can you stand comfortably?  unsteady as soon as standing, reports about an hour    How long can you walk comfortably?  needs walker    Patient Stated Goals  to walk without a walker    Currently in Pain?  Yes    Pain Score  3     Pain Location  Knee    Pain Orientation  Left    Pain Descriptors / Indicators  Aching;Sharp    Pain Type  Chronic pain    Pain Onset  More than a month ago    Pain Frequency  Intermittent             Treatment:     In // bars    Standing with CGA next to support surface:  Airex pad: static stand 30 seconds x 2 trials,  noticeable trembling of ankles/LE's with fatigue and challenge to maintain stability Airex pad: horizontal head turns 30 seconds scanning room 10x ; cueing for arc of motion  Airex pad: vertical head turns 30 seconds, cueing for arc of motion, noticeable sway with upward gaze increasing demand on ankle righting reaction musculature Airex pad: one foot on 6" step one foot on airex pad, hold position for 30 seconds, switch legs, 2x each LE;  airex pad balance beam: side step // bars with decreasing UE support 6x length.   Standing with #2 lb ankle weights -hip extension 12x each LE SUE support, cueing for keeping knee straight -hip flexion with straight leg 12x each LE, SUE support -side stepping in // bars BUE support 2x length of // bars  Ambulate in hallway with focus on step length and hip flexion/ knee flexion for foot clearance with cueing for heel strike 4x 86 ft,   Ambulate with horizontal head turns and cueing for heel strike with CGA and occasional LOB 2x 86 ft.    Seated with # 2lb ankle weights     -Seated marches with upright posture, back away from back of chair for abdominal/trunk activation/stabilization, 1x15 each LE    -Seated LAQ with 3 second holds, 1x15 each LE, cueing for muscle activation and sequencing for neutral alignment    Seated hip abduction GTB 2x15       -Seated IR/ER with cueing for stabilizing knee placement with lateral foot movement for optimal muscle recruitment, 10x each LE    Pt educated throughout session about proper posture and technique with exercises. Improved exercise technique, movement at target joints, use of target muscles after min to mod verbal, visual, tactile cues                      PT Education - 02/12/19 0754    Education Details  exercise technique, body mechanics    Person(s) Educated  Patient    Methods  Explanation;Demonstration;Tactile cues;Verbal cues    Comprehension  Verbalized understanding;Returned  demonstration;Verbal cues required;Tactile cues required       PT Short Term Goals - 01/19/19 1305      PT SHORT TERM GOAL #1   Title  Patient will be independent in home exercise program to improve strength/mobility for better functional independence with ADLs.    Baseline  10/12: HEP given    Time  2    Period  Weeks    Status  New    Target Date  02/02/19        PT Long Term Goals - 01/19/19 1306      PT LONG TERM GOAL #1   Title  Patient will increase Berg Balance score by > 6 points (45/56) to demonstrate decreased fall risk during functional activities.    Baseline  10/12: 39/56    Time  8    Period  Weeks    Status  New    Target Date  03/16/19      PT LONG TERM GOAL #2   Title  Patient will increase 10 meter walk test to >1.67m/s as to improve gait speed for better community ambulation and to reduce fall risk.    Baseline  10/12=0.71 m/s    Time  8    Period  Weeks    Status  New    Target Date  03/16/19      PT LONG TERM GOAL #3   Title  Patient will increase ABC scale score >80% to demonstrate better functional mobility and better confidence with ADLs.    Baseline  10/12: 42.5%    Time  8    Period  Weeks    Status  New    Target Date  03/16/19      PT LONG TERM GOAL #4   Title  Patient will ambulate without an AD for > 500 ft for return to PLOF with improved stability with prolonged mobility.    Baseline  10/12: requires use of walker    Time  8    Period  Weeks    Status  New    Target Date  03/16/19            Plan - 02/12/19 [id number]    Clinical Impression Statement  Patient is limited by left nee pain with intermittent interventions , especially during side stepping. She continues to be motivated to progress towards walking without a walker. Is challenged with coordination/spatial awareness as well as foot clearance.  The patient would benefit from continued skilled PT intervention to progress towards goals.    Personal Factors and Comorbidities   Age;Comorbidity 3+;Fitness;Sex;Social Background;Past/Current Experience;Transportation    Comorbidities  R breast CA with history of radiation and partial mastectomy (2018), DM type II, HTN, thyroid surgery, HLD, hypothyroidism.    Examination-Activity Limitations  Bathing;Bend;Caring for Others;Dressing;Stairs;Squat;Locomotion Level;Stand;Lift;Transfers    Examination-Participation Restrictions  Cleaning;Church;Community Activity;Interpersonal Relationship;Laundry;Volunteer;Shop;Personal Finances;Meal Prep;Yard Work    [employer] Potential  Fair    PT Frequency  2x / week    PT Duration  8 weeks    PT Treatment/Interventions  ADLs/Self Care Home Management;Aquatic Therapy;Cryotherapy;Electrical Stimulation;Canalith Repostioning;Biofeedback;Moist Heat;Traction;Ultrasound;DME Instruction;Gait training;Stair training;Functional mobility training;Therapeutic activities;Patient/family education;Cognitive remediation;Neuromuscular re-education;Balance training;Therapeutic exercise;Orthotic Fit/Training;Manual techniques;Energy conservation;Passive range of motion;Taping;Vestibular    PT Next Visit Plan  weight shift/balance on RLE, foot clearance RLE, progress static and dynamic balance    PT Home Exercise Plan  see above    Consulted and Agree with Plan of Care  Patient       Patient will benefit from skilled therapeutic intervention in order to improve the following deficits and impairments:  Abnormal gait, Cardiopulmonary status limiting activity, Decreased activity tolerance, Decreased balance, Decreased knowledge of precautions, Decreased endurance, Decreased coordination, Decreased cognition, Decreased knowledge of use of DME, Decreased mobility, Decreased safety awareness, Difficulty walking, Decreased strength, Impaired flexibility, Impaired perceived functional ability, Postural dysfunction, Improper body mechanics  Visit Diagnosis: Muscle weakness (generalized)  Unsteadiness on feet  Hemiplegia  and hemiparesis following cerebral infarction affecting right dominant side [facility])     Problem List Patient Active Problem List   Diagnosis Date Noted  . TIA (transient ischemic attack) 12/10/2018  . Primary cancer of upper inner quadrant of right female breast ([place]) 12/25/2016   Janna Arch, PT, DPT   02/12/2019, 12:51 PM  [place] MAIN [facility] SERVICES [address] [place], [place], [address] Phone: [phone]   Fax:  [phone]  Name: Samhita Champeau MRN: [id number] Date of Birth: 1940-12-08

## 2019-02-12 NOTE — Therapy (Signed)
[place] MAIN [facility] SERVICES [address] [place], [place], [address] Phone: [phone]   Fax:  [phone]  Occupational Therapy Treatment  Patient Details  Name: Meghan Dougherty MRN: [id number] Date of Birth: Feb 05, 1941 No data recorded  Encounter Date: 02/12/2019  OT End of Session - 02/12/19 2109    Visit Number  5    Number of Visits  24    Date for OT Re-Evaluation  04/10/19    OT Start Time  1015    OT Stop Time  1100    OT Time Calculation (min)  45 min    Activity Tolerance  Patient tolerated treatment well    Behavior During Therapy  [facility] for tasks assessed/performed;Anxious;Flat affect       Past Medical History:  Diagnosis Date  . Borderline diabetes   . Breast cancer, right ([place]) 11/2016   invasive mammary carcinoma, Lumpectomy and rad tx's.   . Colon polyp   . Diabetes mellitus without complication ([place])    type 2  . Hyperlipemia   . Hypertension   . Hyperthyroidism   . Hypothyroidism   . Insomnia   . Osteopenia   . Personal history of radiation therapy 2018   right breast cancer    Past Surgical History:  Procedure Laterality Date  . ABDOMINAL HYSTERECTOMY     oophorectomy  . APPENDECTOMY    . BREAST BIOPSY Left    neg core  . BREAST BIOPSY Right    neg  core  . BREAST BIOPSY Right 11/2016   invasive mammary carcinoma, [place]  . BREAST BIOPSY Right 11/2016   benign, stero  . BREAST LUMPECTOMY Right 12/2016   invasive mammary carcinoma  Negative margins  . CATARACT EXTRACTION W/ INTRAOCULAR LENS IMPLANT    . CHOLECYSTECTOMY    . COLONOSCOPY    . COLONOSCOPY WITH PROPOFOL N/A 01/15/2018   Procedure: COLONOSCOPY WITH PROPOFOL;  Surgeon: Toledo, Benay Pike, MD;  Location: [facility] ENDOSCOPY;  Service: Gastroenterology;  Laterality: N/A;  . EYE SURGERY     bilateral cataracts  . OOPHORECTOMY    . PARTIAL MASTECTOMY WITH NEEDLE LOCALIZATION Right 01/04/2017   Procedure: PARTIAL MASTECTOMY WITH NEEDLE  LOCALIZATION;  Surgeon: Leonie Green, MD;  Location: [facility] ORS;  Service: General;  Laterality: Right;  . SENTINEL NODE BIOPSY Right 01/04/2017   Procedure: SENTINEL NODE BIOPSY;  Surgeon: Leonie Green, MD;  Location: [facility] ORS;  Service: General;  Laterality: Right;  . THYROID SURGERY     total thyroidectomy  . TONSILLECTOMY    . TOTAL THYROIDECTOMY      There were no vitals filed for this visit.  Subjective Assessment - 02/12/19 1023    Subjective   Patient reports she is having trouble at times with coordination of using the touch screen on her phone.  Would like to work on some isolated finger movements.    Pertinent History  Patient 78 year old white female with a known history of diabetes,  dyslipidemia and hypothyroidism, presented to the emergency room with onset of slurred speech that started at 4 PM on 12/09/2018, with right facial droop and mild weakness on the right side.  Patient was evaluated and further work-up revealed her to have acute left internal capsule stroke.  Echocardiogram showed no evidence of cardioembolic cause.  CTA of the neck showed no large vessel occlusion.  Atherosclerotic disease was noted.    Patient Stated Goals  Patient reports she wants to walk, drive, and be independent  again.    Currently in Pain?  No/denies    Pain Score  0-No pain       Neuromuscular Reeducation: coin sorting with cues to use isolated fingers to move item to sort into piles and then picking up, placing into bank.  Occasional cues for prehension patterns.   Increased difficulty with moving coins on tablecloth vinyl, graded task to use a smooth surface and she had decreased difficulty.   Knotting and unknotting of medium nylon rope with cues for prehension patterns with  Right hand cues to lead task.  TYPING test: 2 WPM  multiple errors  Handwriting skills tends to write smaller than her baseline and legibility is poor.    Response to tx:   Patient continues to  demonstrate difficulty with isolated finger movements on the right as seen with writing, typing and attempting to use her phone with a touch screen.  She demonstrates decreased speed and dexterity with tasks and benefits from cues for prehension patterns.  Patient with poor legibility and decreased isolated finger movements for typing skills.  Continue to work towards goals in plan of care to improve hand function for necessary daily tasks.                             OT Long Term Goals - 01/16/19 1447      OT LONG TERM GOAL #1   Title  Patient will demonstrate modified independence in all basic self care tasks including bathing in the shower.    Baseline  pt able to complete sponge bath but not yet bathing in shower.    Time  6    Period  Weeks    Status  New    Target Date  02/27/19      OT LONG TERM GOAL #2   Title  Patient will be independent in home exercise program for strength and coordination.    Baseline  no current program    Time  12    Period  Weeks    Status  New    Target Date  04/10/19      OT LONG TERM GOAL #3   Title  Patient will complete light homemaking skills with modified independence.    Baseline  assist with cleaning, laundry at eval    Time  12    Period  Weeks    Status  New    Target Date  04/10/19      OT LONG TERM GOAL #4   Title  Patient will demonstrate the abiity to hold a pen with secure tripod grasp and write her name with 75% legibility.    Baseline  difficulty with holding pen and forming letters of her name.    Time  6    Period  Weeks    Status  New    Target Date  04/10/19      OT LONG TERM GOAL #5   Title  Patient will demonstrate the ability to manage her finances online and written checks with modified independence.    Baseline  son is helping at eval.    Time  12    Period  Weeks    Status  New    Target Date  04/10/19      Long Term Additional Goals   Additional Long Term Goals  Yes      OT LONG TERM  GOAL #6   Title  Patient will  improve right fine motor coordination to apply her makeup with modified independence.    Baseline  unable at eval    Time  12    Period  Weeks    Status  New    Target Date  04/10/19      OT LONG TERM GOAL #7   Title  Patient will improve right hand function to operate the remote control for the TV.    Baseline  unable at eval    Time  12    Period  Weeks    Status  New    Target Date  04/10/19            Plan - 02/12/19 2109    Clinical Impression Statement  Patient continues to demonstrate difficulty with isolated finger movements on the right as seen with writing, typing and attempting to use her phone with a touch screen.  She demonstrates decreased speed and dexterity with tasks and benefits from cues for prehension patterns.  Patient with poor legibility and decreased isolated finger movements for typing skills.  Continue to work towards goals in plan of care to improve hand function for necessary daily tasks.    OT Occupational Profile and History  Problem Focused Assessment - Including review of records relating to presenting problem    Occupational performance deficits (Please refer to evaluation for details):  ADL's;IADL's;Social Participation;Leisure    Body Structure / Function / Physical Skills  ADL;Coordination;UE functional use;Balance;Decreased knowledge of use of DME;IADL;Dexterity;FMC;Strength    Psychosocial Skills  Coping Strategies;Environmental  Adaptations;Routines and Behaviors;Habits    Rehab Potential  Good    Clinical Decision Making  Several treatment options, min-mod task modification necessary    Comorbidities Affecting Occupational Performance:  May have comorbidities impacting occupational performance    Modification or Assistance to Complete Evaluation   No modification of tasks or assist necessary to complete eval    OT Frequency  2x / week    OT Duration  12 weeks    OT Treatment/Interventions  Self-care/ADL  training;Therapeutic exercise;DME and/or AE instruction;Functional Mobility Training;Balance training;Neuromuscular education;Manual Therapy;Moist Heat;Therapeutic activities;Patient/family education    Consulted and Agree with Plan of Care  Patient       Patient will benefit from skilled therapeutic intervention in order to improve the following deficits and impairments:   Body Structure / Function / Physical Skills: ADL, Coordination, UE functional use, Balance, Decreased knowledge of use of DME, IADL, Dexterity, FMC, Strength   Psychosocial Skills: Coping Strategies, Environmental  Adaptations, Routines and Behaviors, Habits   Visit Diagnosis: Muscle weakness (generalized)  Unsteadiness on feet  Hemiplegia and hemiparesis following cerebral infarction affecting right dominant side (HCC)  Other lack of coordination    Problem List Patient Active Problem List   Diagnosis Date Noted  . TIA (transient ischemic attack) 12/10/2018  . Primary cancer of upper inner quadrant of right female breast ([place]) 12/25/2016  Da Authement T Tomasita Morrow, OTR/L, CLT   Meghan Dougherty 02/13/2019, 5:55 PM  [place] MAIN [facility] SERVICES [address] [place], [place], [address] Phone: [phone]   Fax:  [phone]  Name: Meghan Dougherty MRN: [id number] Date of Birth: 03-05-1941

## 2019-02-17 ENCOUNTER — Ambulatory Visit: Payer: Medicare Other | Admitting: Physical Therapy

## 2019-02-17 ENCOUNTER — Ambulatory Visit: Payer: Medicare Other | Admitting: Occupational Therapy

## 2019-02-17 ENCOUNTER — Encounter: Payer: Self-pay | Admitting: Physical Therapy

## 2019-02-17 ENCOUNTER — Encounter: Payer: Self-pay | Admitting: Occupational Therapy

## 2019-02-17 ENCOUNTER — Other Ambulatory Visit: Payer: Self-pay

## 2019-02-17 DIAGNOSIS — R2681 Unsteadiness on feet: Secondary | ICD-10-CM

## 2019-02-17 DIAGNOSIS — M6281 Muscle weakness (generalized): Secondary | ICD-10-CM | POA: Diagnosis not present

## 2019-02-17 DIAGNOSIS — R278 Other lack of coordination: Secondary | ICD-10-CM

## 2019-02-17 NOTE — Therapy (Signed)
[place] MAIN [facility] SERVICES [address] [place], [place], [address] Phone: [phone]   Fax:  [phone]  Occupational Therapy Treatment/Discharge Note  Patient Details  Name: Meghan Dougherty MRN: [id number] Date of Birth: April 18, 1940 No data recorded  Encounter Date: 02/17/2019  OT End of Session - 02/17/19 0936    Visit Number  6    Number of Visits  24    Date for OT Re-Evaluation  04/10/19    OT Start Time  0930    OT Stop Time  1015    OT Time Calculation (min)  45 min    Activity Tolerance  Patient tolerated treatment well    Behavior During Therapy  [facility] for tasks assessed/performed       Past Medical History:  Diagnosis Date  . Borderline diabetes   . Breast cancer, right ([place]) 11/2016   invasive mammary carcinoma, Lumpectomy and rad tx's.   . Colon polyp   . Diabetes mellitus without complication ([place])    type 2  . Hyperlipemia   . Hypertension   . Hyperthyroidism   . Hypothyroidism   . Insomnia   . Osteopenia   . Personal history of radiation therapy 2018   right breast cancer    Past Surgical History:  Procedure Laterality Date  . ABDOMINAL HYSTERECTOMY     oophorectomy  . APPENDECTOMY    . BREAST BIOPSY Left    neg core  . BREAST BIOPSY Right    neg  core  . BREAST BIOPSY Right 11/2016   invasive mammary carcinoma, [place]  . BREAST BIOPSY Right 11/2016   benign, stero  . BREAST LUMPECTOMY Right 12/2016   invasive mammary carcinoma  Negative margins  . CATARACT EXTRACTION W/ INTRAOCULAR LENS IMPLANT    . CHOLECYSTECTOMY    . COLONOSCOPY    . COLONOSCOPY WITH PROPOFOL N/A 01/15/2018   Procedure: COLONOSCOPY WITH PROPOFOL;  Surgeon: Toledo, Benay Pike, MD;  Location: [facility] ENDOSCOPY;  Service: Gastroenterology;  Laterality: N/A;  . EYE SURGERY     bilateral cataracts  . OOPHORECTOMY    . PARTIAL MASTECTOMY WITH NEEDLE LOCALIZATION Right 01/04/2017   Procedure: PARTIAL MASTECTOMY WITH NEEDLE  LOCALIZATION;  Surgeon: Leonie Green, MD;  Location: [facility] ORS;  Service: General;  Laterality: Right;  . SENTINEL NODE BIOPSY Right 01/04/2017   Procedure: SENTINEL NODE BIOPSY;  Surgeon: Leonie Green, MD;  Location: [facility] ORS;  Service: General;  Laterality: Right;  . THYROID SURGERY     total thyroidectomy  . TONSILLECTOMY    . TOTAL THYROIDECTOMY      There were no vitals filed for this visit.  Subjective Assessment - 02/17/19 0933    Subjective   Pt. reports that she sometimes has difficulty with an iPAD, and iphone.    Patient is accompanied by:  Family member    Pertinent History  Patient 78 year old white female with a known history of diabetes,  dyslipidemia and hypothyroidism, presented to the emergency room with onset of slurred speech that started at 4 PM on 12/09/2018, with right facial droop and mild weakness on the right side.  Patient was evaluated and further work-up revealed her to have acute left internal capsule stroke.  Echocardiogram showed no evidence of cardioembolic cause.  CTA of the neck showed no large vessel occlusion.  Atherosclerotic disease was noted.    Currently in Pain?  No/denies         [facility] OT Assessment - 02/17/19 0001  Coordination   Right 9 Hole Peg Test  1 min & 8 sec.    Left 9 Hole Peg Test  27      Hand Function   Right Hand Grip (lbs)  40    Right Hand Lateral Pinch  13 lbs    Right Hand 3 Point Pinch  10 lbs    Left Hand Grip (lbs)  54    Left Hand Lateral Pinch  12 lbs    Left 3 point pinch  11 lbs       OT TREATMENT    Measurements were obtained, and goals were reviewed with the pt. Reviewed HEP with the pt.  Pt. has made excellent progress with her RUE strength, grip strength, pinch strength, and [place] skills per measurements obtained. Pt. is now using her right hand more during daily tasks, and is performing the HEP at home. Pt. continues to present with decreased right hand [facility] skills, and has limited isolated  2nd digit which makes it difficult to use her iphone, and ipad efficiently. Pt. reports that she is able to use her right hand for these tasks, however presses too hard at times. After taking measurements, and reviewing goals it was determined with the pt. OT services were discharged per pt. request. Pt. is independent with HEPs. Pt. was provided with a HEP for [facility] skills.                  OT Education - 02/17/19 0936    Education Details  strengthening tasks, fine motor coordination skills    Person(s) Educated  Patient    Methods  Explanation;Demonstration    Comprehension  Verbalized understanding;Returned demonstration;Need further instruction          OT Long Term Goals - 02/17/19 0946      OT LONG TERM GOAL #1   Title  Patient will demonstrate modified independence in all basic self care tasks including bathing in the shower.    Baseline  pt able to complete sponge bath but not yet bathing in shower.    Time  6    Period  Weeks    Status  Achieved    Target Date  02/27/19      OT LONG TERM GOAL #2   Title  Patient will be independent in home exercise program for strength and coordination.    Baseline  no current program    Time  12    Period  Weeks    Status  On-going    Target Date  04/10/19      OT LONG TERM GOAL #3   Title  Patient will complete light homemaking skills with modified independence.    Baseline  assist with cleaning, laundry at eval    Time  12    Period  Weeks    Status  On-going    Target Date  04/10/19      OT LONG TERM GOAL #4   Title  Patient will demonstrate the abiity to hold a pen with secure tripod grasp and write her name with 75% legibility.    Baseline  difficulty with holding pen and forming letters of her name.    Time  6    Period  Weeks    Status  On-going    Target Date  04/10/19      OT LONG TERM GOAL #5   Title  Patient will demonstrate the ability to manage her finances online and written checks with modified  independence.    Baseline  Pt. is able to complete    Time  12    Period  Weeks    Status  Achieved    Target Date  04/10/19      OT LONG TERM GOAL #6   Title  Patient will improve right fine motor coordination to apply her makeup with modified independence.    Baseline  Pt. continues to have difficulty    Time  12    Period  Weeks    Status  On-going    Target Date  04/10/19      OT LONG TERM GOAL #7   Title  Patient will improve right hand function to operate the remote control for the TV.    Baseline  Pt. attempts to use it, however has difficulty    Time  12    Period  Weeks    Status  On-going    Target Date  04/10/19            Plan - 02/17/19 1218    Clinical Impression Statement  Pt. has made excellent progress with her RUE strength, grip strength, pinch strength, and [place] skills per measurements obtained. Pt. is now using her right hand more during daily tasks, and is performing the HEP at home. Pt. continues to present with decreased right hand [facility] skills, and has limited isolated 2nd digit which makes it difficult to use her iphone, and ipad efficiently. Pt. reports that she is able to use her right hand for these tasks, however presses too hard at times. After taking measurements, and reviewing goals it was determined with the pt. OT services were discharged per pt. request. Pt. is independent with HEPs. Pt. was provided with a HEP for [facility] skills.    OT Occupational Profile and History  Problem Focused Assessment - Including review of records relating to presenting problem    Occupational performance deficits (Please refer to evaluation for details):  ADL's;IADL's;Social Participation;Leisure    Body Structure / Function / Physical Skills  ADL;Coordination;UE functional use;Balance;Decreased knowledge of use of DME;IADL;Dexterity;FMC;Strength    Psychosocial Skills  Coping Strategies;Environmental  Adaptations;Routines and Behaviors;Habits    Rehab Potential  Good     Clinical Decision Making  Several treatment options, min-mod task modification necessary    Comorbidities Affecting Occupational Performance:  May have comorbidities impacting occupational performance    Modification or Assistance to Complete Evaluation   No modification of tasks or assist necessary to complete eval    OT Frequency  2x / week    OT Duration  12 weeks    OT Treatment/Interventions  Self-care/ADL training;Therapeutic exercise;DME and/or AE instruction;Functional Mobility Training;Balance training;Neuromuscular education;Manual Therapy;Moist Heat;Therapeutic activities;Patient/family education    Consulted and Agree with Plan of Care  Patient       Patient will benefit from skilled therapeutic intervention in order to improve the following deficits and impairments:   Body Structure / Function / Physical Skills: ADL, Coordination, UE functional use, Balance, Decreased knowledge of use of DME, IADL, Dexterity, FMC, Strength   Psychosocial Skills: Coping Strategies, Environmental  Adaptations, Routines and Behaviors, Habits   Visit Diagnosis: Muscle weakness (generalized)  Other lack of coordination    Problem List Patient Active Problem List   Diagnosis Date Noted  . TIA (transient ischemic attack) 12/10/2018  . Primary cancer of upper inner quadrant of right female breast ([place]) 12/25/2016    Harrel Carina, MS, OTR/L 02/17/2019, 12:30 PM  [place]  [place], [place], [address] Phone: [phone]   Fax:  [phone]  Name: Meghan Dougherty MRN: [id number] Date of Birth: 1940/10/28

## 2019-02-17 NOTE — Therapy (Signed)
[place] MAIN [facility] SERVICES [address] [place], [place], [address] Phone: [phone]   Fax:  [phone]  Physical Therapy Treatment  Patient Details  Name: Meghan Dougherty MRN: [id number] Date of Birth: [birth year] Referring Provider (PT): Doran Clay   Encounter Date: 02/17/2019  PT End of Session - 02/17/19 0854    Visit Number  8    Number of Visits  16    Date for PT Re-Evaluation  03/16/19    Authorization Type  8/10 eval 01/19/19, goals assessed 02/17/19    PT Start Time  0846    PT Stop Time  0930    PT Time Calculation (min)  44 min    Equipment Utilized During Treatment  Gait belt    Activity Tolerance  Patient tolerated treatment well    Behavior During Therapy  [facility] for tasks assessed/performed       Past Medical History:  Diagnosis Date  . Borderline diabetes   . Breast cancer, right ([place]) 11/2016   invasive mammary carcinoma, Lumpectomy and rad tx's.   . Colon polyp   . Diabetes mellitus without complication ([place])    type 2  . Hyperlipemia   . Hypertension   . Hyperthyroidism   . Hypothyroidism   . Insomnia   . Osteopenia   . Personal history of radiation therapy 2018   right breast cancer    Past Surgical History:  Procedure Laterality Date  . ABDOMINAL HYSTERECTOMY     oophorectomy  . APPENDECTOMY    . BREAST BIOPSY Left    neg core  . BREAST BIOPSY Right    neg  core  . BREAST BIOPSY Right 11/2016   invasive mammary carcinoma, [place]  . BREAST BIOPSY Right 11/2016   benign, stero  . BREAST LUMPECTOMY Right 12/2016   invasive mammary carcinoma  Negative margins  . CATARACT EXTRACTION W/ INTRAOCULAR LENS IMPLANT    . CHOLECYSTECTOMY    . COLONOSCOPY    . COLONOSCOPY WITH PROPOFOL N/A 01/15/2018   Procedure: COLONOSCOPY WITH PROPOFOL;  Surgeon: Toledo, Benay Pike, MD;  Location: [facility] ENDOSCOPY;  Service: Gastroenterology;  Laterality: N/A;  . EYE SURGERY     bilateral cataracts  .  OOPHORECTOMY    . PARTIAL MASTECTOMY WITH NEEDLE LOCALIZATION Right 01/04/2017   Procedure: PARTIAL MASTECTOMY WITH NEEDLE LOCALIZATION;  Surgeon: Leonie Green, MD;  Location: [facility] ORS;  Service: General;  Laterality: Right;  . SENTINEL NODE BIOPSY Right 01/04/2017   Procedure: SENTINEL NODE BIOPSY;  Surgeon: Leonie Green, MD;  Location: [facility] ORS;  Service: General;  Laterality: Right;  . THYROID SURGERY     total thyroidectomy  . TONSILLECTOMY    . TOTAL THYROIDECTOMY      There were no vitals filed for this visit.  Subjective Assessment - 02/17/19 0852    Subjective  Patient reports "How much longer do I need to keep coming here. I think that I'm about done." She presents to therapy carrying her RW. She denies any falls. She reports not using the walker when at home but will use it when outside of her home.    Pertinent History  Patient admitted to the hospital on 12/10/18 due to having slurred speech and then a fall when her R knee gave out. She was noted to have R facial droop, R sided weakness, expressive aphasia, and dysarthria. 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 PMH includes R  breast CA with history of radiation and partial mastectomy (2018), DM type II, HTN, thyroid surgery, HLD, hypothyroidism. While in the hospital was able to walk with walker. Patient was then d/c on 12/19/18 to [place] rehab where she was discharged 01/07/19    How long can you sit comfortably?  all day    How long can you stand comfortably?  unsteady as soon as standing, reports about an hour    How long can you walk comfortably?  needs walker    Patient Stated Goals  to walk without a walker    Currently in Pain?  No/denies    Pain Onset  More than a month ago    Multiple Pain Sites  No         [facility] PT Assessment - 02/17/19 0001      Observation/Other Assessments   Activities of Balance Confidence Scale (ABC  Scale)   74% (high level physical functioning, improved from 01/19/19 which was 42/5%)      6 Minute Walk- Baseline   6 Minute Walk- Baseline  yes    BP (mmHg)  143/64    HR (bpm)  62    02 Sat (%RA)  99 %      6 Minute walk- Post Test   6 Minute Walk Post Test  yes    BP (mmHg)  159/65    HR (bpm)  64    02 Sat (%RA)  99 %      6 minute walk test results    Aerobic Endurance Distance Walked  1000    Endurance additional comments  without AD, community ambulator      Standardized Balance Assessment   10 Meter Walk  0.88 m/s without AD (limited community ambulator, slight risk for falls, improved from 01/19/19 which was 0.71 m/s      Berg Balance Test   Sit to Stand  Able to stand without using hands and stabilize independently    Standing Unsupported  Able to stand safely 2 minutes    Sitting with Back Unsupported but Feet Supported on Floor or Stool  Able to sit safely and securely 2 minutes    Stand to Sit  Sits safely with minimal use of hands    Transfers  Able to transfer safely, minor use of hands    Standing Unsupported with Eyes Closed  Able to stand 10 seconds safely    Standing Unsupported with Feet Together  Able to place feet together independently and stand for 1 minute with supervision    From Standing, Reach Forward with Outstretched Arm  Can reach forward >12 cm safely (5")    From Standing Position, Pick up Object from Floor  Able to pick up shoe safely and easily    From Standing Position, Turn to Look Behind Over each Shoulder  Looks behind from both sides and weight shifts well    Turn 360 Degrees  Able to turn 360 degrees safely but slowly    Standing Unsupported, Alternately Place Feet on Step/Stool  Able to stand independently and safely and complete 8 steps in 20 seconds    Standing Unsupported, One Foot in Front  Able to take small step independently and hold 30 seconds    Standing on One Leg  Tries to lift leg/unable to hold 3 seconds but remains standing  independently    Total Score  47    Berg comment:  increased fall risk, improved from 01/19/19 which was 39/56  TREATMENT: Warm up on [place] BUE/BLE level 2 x3 min (unbilled); PT instructed patient in [place] Balance Assessment, 10 meter walk, 6 min walk etc to address goals, see above;  Patient expressed understanding of HEP but admits that she has trouble with some of the exercises especially side stepping.   Recommend patient continue with therapy for a few more sessions to advance/re-educate patient on HEP for better continued improvement with community and home activities.                   PT Education - 02/17/19 1033    Education Details  progress towards goals/plan of care    Person(s) Educated  Patient    Methods  Explanation    Comprehension  Verbalized understanding       PT Short Term Goals - 02/17/19 0854      PT SHORT TERM GOAL #1   Title  Patient will be independent in home exercise program to improve strength/mobility for better functional independence with ADLs.    Baseline  10/12: HEP given, 02/17/19: doing them every other day    Time  2    Period  Weeks    Status  Partially Met    Target Date  02/02/19        PT Long Term Goals - 02/17/19 0855      PT LONG TERM GOAL #1   Title  Patient will increase Berg Balance score by > 6 points (45/56) to demonstrate decreased fall risk during functional activities.    Baseline  10/12: 39/56, 02/17/19: 47/56    Time  8    Period  Weeks    Status  Achieved    Target Date  03/16/19      PT LONG TERM GOAL #2   Title  Patient will increase 10 meter walk test to >1.98ms as to improve gait speed for better community ambulation and to reduce fall risk.    Baseline  10/12=0.71 m/s, 11/10: 0.88 m/s    Time  8    Period  Weeks    Status  Partially Met    Target Date  03/16/19      PT LONG TERM GOAL #3   Title  Patient will increase ABC scale score >80% to demonstrate better functional mobility  and better confidence with ADLs.    Baseline  10/12: 42.5%    Time  8    Period  Weeks    Status  Partially Met    Target Date  03/16/19      PT LONG TERM GOAL #4   Title  Patient will ambulate without an AD for > 500 ft for return to PLOF with improved stability with prolonged mobility.    Baseline  10/12: requires use of walker, 02/17/19: 1000 feet for 6 min walk test without AD    Time  8    Period  Weeks    Status  Achieved    Target Date  03/16/19            Plan - 02/17/19 1034    Clinical Impression Statement  Patient expressed questioning regarding how much therapy she will need to continue. She expressed that she is currently walking at home without an AD and feels like her walking is back to baseline. PT assessed goals. She has made progress towards all goals and has met some of them. She exhibits significant improvement in balance as evidenced by BStarbucks Corporationand is currently ambulating at  a community ambulator distance without AD on 6 min walk test. However despite these improvements, she is still walking at a slightly slower speed and exhibits some weakness in BLE. Recommend patient continue with therapy for a few more sessions to advance HEP and help her achieve all goals. Patient verbalized understanding and agreement.    Personal Factors and Comorbidities  Age;Comorbidity 3+;Fitness;Sex;Social Background;Past/Current Experience;Transportation    Comorbidities  R breast CA with history of radiation and partial mastectomy (2018), DM type II, HTN, thyroid surgery, HLD, hypothyroidism.    Examination-Activity Limitations  Bathing;Bend;Caring for Others;Dressing;Stairs;Squat;Locomotion Level;Stand;Lift;Transfers    Examination-Participation Restrictions  Cleaning;Church;Community Activity;Interpersonal Relationship;Laundry;Volunteer;Shop;Personal Finances;Meal Prep;Yard Work    [employer] Potential  Fair    PT Frequency  2x / week    PT Duration  8 weeks    PT  Treatment/Interventions  ADLs/Self Care Home Management;Aquatic Therapy;Cryotherapy;Electrical Stimulation;Canalith Repostioning;Biofeedback;Moist Heat;Traction;Ultrasound;DME Instruction;Gait training;Stair training;Functional mobility training;Therapeutic activities;Patient/family education;Cognitive remediation;Neuromuscular re-education;Balance training;Therapeutic exercise;Orthotic Fit/Training;Manual techniques;Energy conservation;Passive range of motion;Taping;Vestibular    PT Next Visit Plan  weight shift/balance on RLE, foot clearance RLE, progress static and dynamic balance    PT Home Exercise Plan  see above    Consulted and Agree with Plan of Care  Patient       Patient will benefit from skilled therapeutic intervention in order to improve the following deficits and impairments:  Abnormal gait, Cardiopulmonary status limiting activity, Decreased activity tolerance, Decreased balance, Decreased knowledge of precautions, Decreased endurance, Decreased coordination, Decreased cognition, Decreased knowledge of use of DME, Decreased mobility, Decreased safety awareness, Difficulty walking, Decreased strength, Impaired flexibility, Impaired perceived functional ability, Postural dysfunction, Improper body mechanics  Visit Diagnosis: Muscle weakness (generalized)  Unsteadiness on feet     Problem List Patient Active Problem List   Diagnosis Date Noted  . TIA (transient ischemic attack) 12/10/2018  . Primary cancer of upper inner quadrant of right female breast ([place]) 12/25/2016    Trotter,Margaret PT, DPT 02/17/2019, 10:36 AM  [place] MAIN [facility] SERVICES [place], [place], [address] Phone: [phone]   Fax:  [phone]  Name: Meghan Dougherty MRN: [id number] Date of Birth: [birth year]

## 2019-02-20 ENCOUNTER — Ambulatory Visit: Payer: Medicare Other | Admitting: Speech Pathology

## 2019-02-20 ENCOUNTER — Ambulatory Visit: Payer: Medicare Other

## 2019-02-20 ENCOUNTER — Encounter: Payer: Medicare Other | Admitting: Occupational Therapy

## 2019-02-25 ENCOUNTER — Other Ambulatory Visit: Payer: Self-pay

## 2019-02-25 ENCOUNTER — Ambulatory Visit: Payer: Medicare Other | Admitting: Physical Therapy

## 2019-02-25 ENCOUNTER — Encounter: Payer: Medicare Other | Admitting: Occupational Therapy

## 2019-02-25 ENCOUNTER — Encounter: Payer: Self-pay | Admitting: Physical Therapy

## 2019-02-25 DIAGNOSIS — I69351 Hemiplegia and hemiparesis following cerebral infarction affecting right dominant side: Secondary | ICD-10-CM

## 2019-02-25 DIAGNOSIS — R2689 Other abnormalities of gait and mobility: Secondary | ICD-10-CM

## 2019-02-25 DIAGNOSIS — M6281 Muscle weakness (generalized): Secondary | ICD-10-CM

## 2019-02-25 DIAGNOSIS — R278 Other lack of coordination: Secondary | ICD-10-CM

## 2019-02-25 DIAGNOSIS — R4701 Aphasia: Secondary | ICD-10-CM

## 2019-02-25 DIAGNOSIS — R2681 Unsteadiness on feet: Secondary | ICD-10-CM

## 2019-02-25 NOTE — Therapy (Signed)
[place] MAIN [facility] SERVICES [address] [place], [place], [address] Phone: [phone]   Fax:  [phone]  Physical Therapy Treatment  Patient Details  Name: Meghan Dougherty MRN: [id number] Date of Birth: [birth year] Referring Provider (PT): Doran Clay   Encounter Date: 02/25/2019  PT End of Session - 02/25/19 1051    Visit Number  9    Number of Visits  16    Date for PT Re-Evaluation  03/16/19    Authorization Type  8/10 eval 01/19/19, goals assessed 02/17/19    PT Start Time  1055    PT Stop Time  1133    PT Time Calculation (min)  38 min    Equipment Utilized During Treatment  Gait belt    Activity Tolerance  Patient tolerated treatment well    Behavior During Therapy  WFL for tasks assessed/performed       Past Medical History:  Diagnosis Date  . Borderline diabetes   . Breast cancer, right ([place]) 11/2016   invasive mammary carcinoma, Lumpectomy and rad tx's.   . Colon polyp   . Diabetes mellitus without complication ([place])    type 2  . Hyperlipemia   . Hypertension   . Hyperthyroidism   . Hypothyroidism   . Insomnia   . Osteopenia   . Personal history of radiation therapy 2018   right breast cancer    Past Surgical History:  Procedure Laterality Date  . ABDOMINAL HYSTERECTOMY     oophorectomy  . APPENDECTOMY    . BREAST BIOPSY Left    neg core  . BREAST BIOPSY Right    neg  core  . BREAST BIOPSY Right 11/2016   invasive mammary carcinoma, [place]  . BREAST BIOPSY Right 11/2016   benign, stero  . BREAST LUMPECTOMY Right 12/2016   invasive mammary carcinoma  Negative margins  . CATARACT EXTRACTION W/ INTRAOCULAR LENS IMPLANT    . CHOLECYSTECTOMY    . COLONOSCOPY    . COLONOSCOPY WITH PROPOFOL N/A 01/15/2018   Procedure: COLONOSCOPY WITH PROPOFOL;  Surgeon: Toledo, Benay Pike, MD;  Location: [facility] ENDOSCOPY;  Service: Gastroenterology;  Laterality: N/A;  . EYE SURGERY     bilateral cataracts  .  OOPHORECTOMY    . PARTIAL MASTECTOMY WITH NEEDLE LOCALIZATION Right 01/04/2017   Procedure: PARTIAL MASTECTOMY WITH NEEDLE LOCALIZATION;  Surgeon: Leonie Green, MD;  Location: [facility] ORS;  Service: General;  Laterality: Right;  . SENTINEL NODE BIOPSY Right 01/04/2017   Procedure: SENTINEL NODE BIOPSY;  Surgeon: Leonie Green, MD;  Location: [facility] ORS;  Service: General;  Laterality: Right;  . THYROID SURGERY     total thyroidectomy  . TONSILLECTOMY    . TOTAL THYROIDECTOMY      There were no vitals filed for this visit.  Subjective Assessment - 02/25/19 1057    Subjective  Patient reports that she doesnt like her mask. She is not hurting anywhere.    Patient is accompained by:  --   friend   Pertinent History  Patient admitted to the hospital on 12/10/18 due to having slurred speech and then a fall when her R knee gave out. She was noted to have R facial droop, R sided weakness, expressive aphasia, and dysarthria. 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 PMH includes R breast CA with history of radiation and partial mastectomy (2018), DM type II, HTN, thyroid surgery, HLD, hypothyroidism. While in the hospital was  able to walk with walker. Patient was then d/c on 12/19/18 to [place] rehab where she was discharged 01/07/19    How long can you sit comfortably?  all day    How long can you stand comfortably?  unsteady as soon as standing, reports about an hour    How long can you walk comfortably?  needs walker    Patient Stated Goals  to walk without a walker    Currently in Pain?  No/denies    Pain Score  0-No pain    Pain Onset  More than a month ago       Treatment: Nu-step x 5 mins L 2  TM .9 miles / hour x 1 mins 50 seconds with LLE fatigue and left hip pain, then .7 miles / hour x 2 mins 30 sec Leg press 90 lbs 20 x 3 sets, heel raises x 20 x 3 sets  Gait training: Gait training with spc 1000  feet x 2 with poor sequencing and no carry over with max vC to place spc correctly with gait Gait out in hallway, CGA with VCs for maintaining gait speed and step length Horizontal head turns x100 ft,  Direction changes x100 ft, min difficulty with quick changes Speed changes x100 ft, min difficulty to keep cane in correct sequence, with slower gait speed and maintaining balance, mod difficulty with increased in gait speed   Ascending and descending steps with spc x 4 steps SBA and max assist for sequencing  Pt educated throughout session about proper posture and technique with exercises. Improved exercise technique, movement at target joints, use of target muscles after min to mod verbal, visual, tactile cues. CGA and Min to mod verbal cues used throughout with increased in postural sway and LOB most seen with narrow base of support and while on uneven surfaces. Continues to have balance deficits typical with diagnosis. Patient performs intermediate level exercises without pain behaviors and needs verbal cuing for postural alignment and head positioning    Patient performed with instruction, verbal cues, tactile cues of therapist: goal: increase tissue extensibility, promote proper posture, improve mobility                     PT Education - 02/25/19 1050    Education Details  HEP    Person(s) Educated  Patient    Methods  Explanation    Comprehension  Verbalized understanding;Returned demonstration;Need further instruction;Verbal cues required       PT Short Term Goals - 02/17/19 0854      PT SHORT TERM GOAL #1   Title  Patient will be independent in home exercise program to improve strength/mobility for better functional independence with ADLs.    Baseline  10/12: HEP given, 02/17/19: doing them every other day    Time  2    Period  Weeks    Status  Partially Met    Target Date  02/02/19        PT Long Term Goals - 02/17/19 0855      PT LONG TERM GOAL #1    Title  Patient will increase Berg Balance score by > 6 points (45/56) to demonstrate decreased fall risk during functional activities.    Baseline  10/12: 39/56, 02/17/19: 47/56    Time  8    Period  Weeks    Status  Achieved    Target Date  03/16/19      PT LONG TERM GOAL #2  Title  Patient will increase 10 meter walk test to >1.66ms as to improve gait speed for better community ambulation and to reduce fall risk.    Baseline  10/12=0.71 m/s, 11/10: 0.88 m/s    Time  8    Period  Weeks    Status  Partially Met    Target Date  03/16/19      PT LONG TERM GOAL #3   Title  Patient will increase ABC scale score >80% to demonstrate better functional mobility and better confidence with ADLs.    Baseline  10/12: 42.5%    Time  8    Period  Weeks    Status  Partially Met    Target Date  03/16/19      PT LONG TERM GOAL #4   Title  Patient will ambulate without an AD for > 500 ft for return to PLOF with improved stability with prolonged mobility.    Baseline  10/12: requires use of walker, 02/17/19: 1000 feet for 6 min walk test without AD    Time  8    Period  Weeks    Status  Achieved    Target Date  03/16/19            Plan - 02/25/19 1058    Clinical Impression Statement  p    Personal Factors and Comorbidities  Age;Comorbidity 3+;Fitness;Sex;Social Background;Past/Current Experience;Transportation    Comorbidities  R breast CA with history of radiation and partial mastectomy (2018), DM type II, HTN, thyroid surgery, HLD, hypothyroidism.    Examination-Activity Limitations  Bathing;Bend;Caring for Others;Dressing;Stairs;Squat;Locomotion Level;Stand;Lift;Transfers    Examination-Participation Restrictions  Cleaning;Church;Community Activity;Interpersonal Relationship;Laundry;Volunteer;Shop;Personal Finances;Meal Prep;Yard Work    [employer]ential  Fair    PT Frequency  2x / week    PT Duration  8 weeks    PT Treatment/Interventions  ADLs/Self Care Home Management;Aquatic  Therapy;Cryotherapy;Electrical Stimulation;Canalith Repostioning;Biofeedback;Moist Heat;Traction;Ultrasound;DME Instruction;Gait training;Stair training;Functional mobility training;Therapeutic activities;Patient/family education;Cognitive remediation;Neuromuscular re-education;Balance training;Therapeutic exercise;Orthotic Fit/Training;Manual techniques;Energy conservation;Passive range of motion;Taping;Vestibular    PT Next Visit Plan  weight shift/balance on RLE, foot clearance RLE, progress static and dynamic balance    PT Home Exercise Plan  see above    Consulted and Agree with Plan of Care  Patient       Patient will benefit from skilled therapeutic intervention in order to improve the following deficits and impairments:  Abnormal gait, Cardiopulmonary status limiting activity, Decreased activity tolerance, Decreased balance, Decreased knowledge of precautions, Decreased endurance, Decreased coordination, Decreased cognition, Decreased knowledge of use of DME, Decreased mobility, Decreased safety awareness, Difficulty walking, Decreased strength, Impaired flexibility, Impaired perceived functional ability, Postural dysfunction, Improper body mechanics  Visit Diagnosis: Other lack of coordination  Muscle weakness (generalized)  Unsteadiness on feet  Hemiplegia and hemiparesis following cerebral infarction affecting right dominant side (HCC)  Aphasia  Other abnormalities of gait and mobility     Problem List Patient Active Problem List   Diagnosis Date Noted  . TIA (transient ischemic attack) 12/10/2018  . [place]    MAlanson Puls PT DPT 02/25/2019, 10:58 AM  [place] [facility] [address] [address]Phone: [phone]  Fax:  [phone] Name: CArwilda GeorgiaMRN: [id number] of Birth: [birth year]

## 2019-02-27 ENCOUNTER — Encounter: Payer: Medicare Other | Admitting: Occupational Therapy

## 2019-02-27 ENCOUNTER — Ambulatory Visit: Payer: Medicare Other | Admitting: Speech Pathology

## 2019-02-27 ENCOUNTER — Ambulatory Visit: Payer: Medicare Other

## 2019-03-02 ENCOUNTER — Ambulatory Visit: Payer: Medicare Other

## 2019-03-02 ENCOUNTER — Other Ambulatory Visit: Payer: Self-pay

## 2019-03-02 DIAGNOSIS — I69351 Hemiplegia and hemiparesis following cerebral infarction affecting right dominant side: Secondary | ICD-10-CM

## 2019-03-02 DIAGNOSIS — M6281 Muscle weakness (generalized): Secondary | ICD-10-CM | POA: Diagnosis not present

## 2019-03-02 DIAGNOSIS — R2681 Unsteadiness on feet: Secondary | ICD-10-CM

## 2019-03-02 DIAGNOSIS — R278 Other lack of coordination: Secondary | ICD-10-CM

## 2019-03-02 NOTE — Therapy (Signed)
[place] MAIN [facility] SERVICES [address] [place], [place], [address] Phone: [phone]   Fax:  [phone]  Physical Therapy Treatment Physical Therapy Progress Note   Dates of reporting period  01/19/19  to   03/02/19  Patient Details  Name: Meghan Dougherty MRN: [id number] Date of Birth: Jun 17, 1940 Referring Provider (PT): Doran Clay   Encounter Date: 03/02/2019  PT End of Session - 03/02/19 1318    Visit Number  10    Number of Visits  16    Date for PT Re-Evaluation  03/16/19    Authorization Type  , goals assessed 02/17/19; next session 1/10 PN 11/23    PT Start Time  1030    PT Stop Time  1113    PT Time Calculation (min)  43 min    Equipment Utilized During Treatment  Gait belt    Activity Tolerance  Patient tolerated treatment well    Behavior During Therapy  WFL for tasks assessed/performed       Past Medical History:  Diagnosis Date  . Borderline diabetes   . Breast cancer, right ([place]) 11/2016   invasive mammary carcinoma, Lumpectomy and rad tx's.   . Colon polyp   . Diabetes mellitus without complication ([place])    type 2  . Hyperlipemia   . Hypertension   . Hyperthyroidism   . Hypothyroidism   . Insomnia   . Osteopenia   . Personal history of radiation therapy 2018   right breast cancer    Past Surgical History:  Procedure Laterality Date  . ABDOMINAL HYSTERECTOMY     oophorectomy  . APPENDECTOMY    . BREAST BIOPSY Left    neg core  . BREAST BIOPSY Right    neg  core  . BREAST BIOPSY Right 11/2016   invasive mammary carcinoma, [place]  . BREAST BIOPSY Right 11/2016   benign, stero  . BREAST LUMPECTOMY Right 12/2016   invasive mammary carcinoma  Negative margins  . CATARACT EXTRACTION W/ INTRAOCULAR LENS IMPLANT    . CHOLECYSTECTOMY    . COLONOSCOPY    . COLONOSCOPY WITH PROPOFOL N/A 01/15/2018   Procedure: COLONOSCOPY WITH PROPOFOL;  Surgeon: Toledo, Benay Pike, MD;  Location: [facility];   Service: Gastroenterology;  Laterality: N/A;  . EYE SURGERY     bilateral cataracts  . OOPHORECTOMY    . PARTIAL MASTECTOMY WITH NEEDLE LOCALIZATION Right 01/04/2017   Procedure: PARTIAL MASTECTOMY WITH NEEDLE LOCALIZATION;  Surgeon: Leonie Green, MD;  Location: [facility] ORS;  Service: General;  Laterality: Right;  . SENTINEL NODE BIOPSY Right 01/04/2017   Procedure: SENTINEL NODE BIOPSY;  Surgeon: Leonie Green, MD;  Location: [facility] ORS;  Service: General;  Laterality: Right;  . THYROID SURGERY     total thyroidectomy  . TONSILLECTOMY    . TOTAL THYROIDECTOMY      There were no vitals filed for this visit.  Subjective Assessment - 03/02/19 1316    Subjective  Patient reports she has been doing her exercises, no falls or LOB since last session. Is no longer seeing OT and ST, feels she still needs PT due to having difficulty walking.    Patient is accompained by:  --   friend   Pertinent History  Patient admitted to the hospital on 12/10/18 due to having slurred speech and then a fall when her R knee gave out. She was noted to have R facial droop, R sided weakness, expressive aphasia, and dysarthria. MRI showed  acute nonhemorrhagic linear white matter infarct involving posterior limb of L internal capsule. PMH includes R breast CA with history of radiation and partial mastectomy (2018), DM type II, HTN, thyroid surgery, HLD, hypothyroidism. While in the hospital was able to walk with walker. Patient was then d/c on 12/19/18 to [place] rehab where she was discharged 01/07/19    How long can you sit comfortably?  all day    How long can you stand comfortably?  unsteady as soon as standing, reports about an hour    How long can you walk comfortably?  needs walker    Patient Stated Goals  to walk without a walker    Currently in Pain?  No/denies            Progress note Goals performed 11/10 Review new HEP  Access Code: [email or web address]  URL:  [email or web address]  Date: 02/19/2019  Prepared by: Janna Arch   Exercises . Standing Heel Raise with Support - 15 reps - 2 sets - 1x daily - 7x weekly . Standing Toe Raises at Chair - 15 reps - 2 sets - 1x daily - 7x weekly . Mini Squat with Counter Support - 15 reps - 2 sets - 1x daily - 7x weekly . Lunge with Counter Support - 15 reps - 2 sets - 1x daily - 7x weekly . Standing Tandem Balance with Counter Support - 2 reps - 2 sets - 30 hold - 1x daily - 7x weekly . Standing Single Leg Stance with Counter Support - 2 reps - 2 sets - 30 hold - 1x daily - 7x weekly . Standing Hip Abduction with Counter Support - 10 reps - 2 sets - 5 hold - 1x daily - 7x weekly . Standing March with Counter Support - 10 reps - 2 sets - 5 hold - 1x daily - 7x weekly Standing Hip Extension with Counter Support - 10 reps - 2 sets - 5 hold - 1x daily - 7x weekly   !0 STS from average height chair;   Weave between 6 cones on floor , challenging for RLE coordination, frequently hitting cone/knocking cone over x 6 trials with improving spatial awareness with repetition  7" step toe taps SUE support 15x each LE; challenged to hit sticky note with spatial awareness  4" step toe taps challenging coordination, spatial awareness, and muscle recruitment 2x 30 seconds, very challenging to patient to perform.   Standing with RTB around ankles -hip abduction 12x each LE -hip extension 12x each LE   Seated RTB hamstring curl 12x each LE  Seated RTB abduction 15x    Patient's condition has the potential to improve in response to therapy. Maximum improvement is yet to be obtained. The anticipated improvement is attainable and reasonable in a generally predictable time.  Patient reports she is walking better and is more stable but not where she wants to be yet.    Patient's progress towards functional goals can be seen on visit note of 02/17/19. Patient re-educated on new HEP program with frequent  cueing and correction of body mechanics required. She is challenged with coordination and spatial awareness of RLE and is limited in strength and capacity for mobility with bilateral LE at this time. Patient's condition has the potential to improve in response to therapy. Maximum improvement is yet to be obtained. The anticipated improvement is attainable and reasonable in a generally predictable time.Recommend patient continue with therapy for a few more sessions to advance HEP and help her  achieve all goals. Patient verbalized understanding and agreement.              PT Education - 03/02/19 1317    Education Details  HEP excerise technique, body mechanics    Person(s) Educated  Patient    Methods  Explanation;Demonstration;Tactile cues;Verbal cues    Comprehension  Verbalized understanding;Returned demonstration;Verbal cues required;Tactile cues required       PT Short Term Goals - 02/17/19 0854      PT SHORT TERM GOAL #1   Title  Patient will be independent in home exercise program to improve strength/mobility for better functional independence with ADLs.    Baseline  10/12: HEP given, 02/17/19: doing them every other day    Time  2    Period  Weeks    Status  Partially Met    Target Date  02/02/19        PT Long Term Goals - 02/17/19 0855      PT LONG TERM GOAL #1   Title  Patient will increase Berg Balance score by > 6 points (45/56) to demonstrate decreased fall risk during functional activities.    Baseline  10/12: 39/56, 02/17/19: 47/56    Time  8    Period  Weeks    Status  Achieved    Target Date  03/16/19      PT LONG TERM GOAL #2   Title  Patient will increase 10 meter walk test to >1.21ms as to improve gait speed for better community ambulation and to reduce fall risk.    Baseline  10/12=0.71 m/s, 11/10: 0.88 m/s    Time  8    Period  Weeks    Status  Partially Met    Target Date  03/16/19      PT LONG TERM GOAL #3   Title  Patient will increase ABC  scale score >80% to demonstrate better functional mobility and better confidence with ADLs.    Baseline  10/12: 42.5%    Time  8    Period  Weeks    Status  Partially Met    Target Date  03/16/19      PT LONG TERM GOAL #4   Title  Patient will ambulate without an AD for > 500 ft for return to PLOF with improved stability with prolonged mobility.    Baseline  10/12: requires use of walker, 02/17/19: 1000 feet for 6 min walk test without AD    Time  8    Period  Weeks    Status  Achieved    Target Date  03/16/19            Plan - 03/02/19 1324    Clinical Impression Statement  Patient's progress towards functional goals can be seen on visit note of 02/17/19. Patient re-educated on new HEP program with frequent cueing and correction of body mechanics required. She is challenged with coordination and spatial awareness of RLE and is limited in strength and capacity for mobility with bilateral LE at this time. Patient's condition has the potential to improve in response to therapy. Maximum improvement is yet to be obtained. The anticipated improvement is attainable and reasonable in a generally predictable time.Recommend patient continue with therapy for a few more sessions to advance HEP and help her achieve all goals. Patient verbalized understanding and agreement.    Personal Factors and Comorbidities  Age;Comorbidity 3+;Fitness;Sex;Social Background;Past/Current Experience;Transportation    Comorbidities  R breast CA with history of radiation and partial  mastectomy (2018), DM type II, HTN, thyroid surgery, HLD, hypothyroidism.    Examination-Activity Limitations  Bathing;Bend;Caring for Others;Dressing;Stairs;Squat;Locomotion Level;Stand;Lift;Transfers    Examination-Participation Restrictions  Cleaning;Church;Community Activity;Interpersonal Relationship;Laundry;Volunteer;Shop;Personal Finances;Meal Prep;Yard Work    [employer] Potential  Fair    PT Frequency  2x / week    PT Duration  8  weeks    PT Treatment/Interventions  ADLs/Self Care Home Management;Aquatic Therapy;Cryotherapy;Electrical Stimulation;Canalith Repostioning;Biofeedback;Moist Heat;Traction;Ultrasound;DME Instruction;Gait training;Stair training;Functional mobility training;Therapeutic activities;Patient/family education;Cognitive remediation;Neuromuscular re-education;Balance training;Therapeutic exercise;Orthotic Fit/Training;Manual techniques;Energy conservation;Passive range of motion;Taping;Vestibular    PT Next Visit Plan  weight shift/balance on RLE, foot clearance RLE, progress static and dynamic balance    PT Home Exercise Plan  see above    Consulted and Agree with Plan of Care  Patient       Patient will benefit from skilled therapeutic intervention in order to improve the following deficits and impairments:  Abnormal gait, Cardiopulmonary status limiting activity, Decreased activity tolerance, Decreased balance, Decreased knowledge of precautions, Decreased endurance, Decreased coordination, Decreased cognition, Decreased knowledge of use of DME, Decreased mobility, Decreased safety awareness, Difficulty walking, Decreased strength, Impaired flexibility, Impaired perceived functional ability, Postural dysfunction, Improper body mechanics  Visit Diagnosis: Other lack of coordination  Muscle weakness (generalized)  Unsteadiness on feet  Hemiplegia and hemiparesis following cerebral infarction affecting right dominant side [facility])     Problem List Patient Active Problem List   Diagnosis Date Noted  . TIA (transient ischemic attack) 12/10/2018  . Primary cancer of upper inner quadrant of right female breast ([place]) 12/25/2016   Janna Arch, PT, DPT   03/02/2019, 1:25 PM  [place] MAIN [facility] SERVICES [address] [place], [place], [address] Phone: [phone]   Fax:  [phone]  Name: Meghan Dougherty MRN: [id number] Date of Birth:  1940/07/05

## 2019-03-04 ENCOUNTER — Encounter: Payer: Medicare Other | Admitting: Occupational Therapy

## 2019-03-09 ENCOUNTER — Ambulatory Visit: Payer: Medicare Other

## 2019-03-11 ENCOUNTER — Encounter: Payer: Medicare Other | Admitting: Occupational Therapy

## 2019-03-11 ENCOUNTER — Ambulatory Visit: Payer: Medicare Other | Admitting: Physical Therapy

## 2019-03-17 ENCOUNTER — Encounter: Payer: Medicare Other | Admitting: Speech Pathology

## 2019-03-17 ENCOUNTER — Ambulatory Visit: Payer: Medicare Other

## 2019-03-17 ENCOUNTER — Encounter: Payer: Medicare Other | Admitting: Occupational Therapy

## 2019-03-19 ENCOUNTER — Encounter: Payer: Medicare Other | Admitting: Speech Pathology

## 2019-03-19 ENCOUNTER — Encounter: Payer: Medicare Other | Admitting: Occupational Therapy

## 2019-03-19 ENCOUNTER — Ambulatory Visit: Payer: Medicare Other

## 2019-03-24 ENCOUNTER — Ambulatory Visit: Payer: Medicare Other

## 2019-03-24 ENCOUNTER — Encounter: Payer: Medicare Other | Admitting: Speech Pathology

## 2019-03-24 ENCOUNTER — Encounter: Payer: Medicare Other | Admitting: Occupational Therapy

## 2019-03-26 ENCOUNTER — Encounter: Payer: Medicare Other | Admitting: Occupational Therapy

## 2019-03-26 ENCOUNTER — Ambulatory Visit: Payer: Medicare Other

## 2019-03-26 ENCOUNTER — Encounter: Payer: Medicare Other | Admitting: Speech Pathology

## 2019-03-31 ENCOUNTER — Ambulatory Visit: Payer: Medicare Other

## 2019-03-31 ENCOUNTER — Encounter: Payer: Medicare Other | Admitting: Occupational Therapy

## 2019-03-31 ENCOUNTER — Encounter: Payer: Medicare Other | Admitting: Speech Pathology

## 2019-04-02 ENCOUNTER — Encounter: Payer: Medicare Other | Admitting: Occupational Therapy

## 2019-04-02 ENCOUNTER — Ambulatory Visit: Payer: Medicare Other

## 2019-04-02 ENCOUNTER — Encounter: Payer: Medicare Other | Admitting: Speech Pathology

## 2019-04-07 ENCOUNTER — Ambulatory Visit: Payer: Medicare Other

## 2019-04-07 ENCOUNTER — Encounter: Payer: Medicare Other | Admitting: Occupational Therapy

## 2019-04-07 ENCOUNTER — Encounter: Payer: Medicare Other | Admitting: Speech Pathology

## 2019-04-09 ENCOUNTER — Encounter: Payer: Medicare Other | Admitting: Speech Pathology

## 2019-04-09 ENCOUNTER — Ambulatory Visit: Payer: Medicare Other

## 2019-04-09 ENCOUNTER — Encounter: Payer: Medicare Other | Admitting: Occupational Therapy

## 2019-04-13 ENCOUNTER — Ambulatory Visit: Payer: Medicare Other

## 2019-04-13 ENCOUNTER — Encounter: Payer: Medicare Other | Admitting: Speech Pathology

## 2019-04-13 ENCOUNTER — Encounter: Payer: Medicare Other | Admitting: Occupational Therapy

## 2019-04-15 ENCOUNTER — Encounter: Payer: Medicare Other | Admitting: Occupational Therapy

## 2019-04-15 ENCOUNTER — Encounter: Payer: Medicare Other | Admitting: Speech Pathology

## 2019-04-15 ENCOUNTER — Ambulatory Visit: Payer: Medicare Other

## 2019-04-20 ENCOUNTER — Ambulatory Visit: Payer: Medicare Other

## 2019-04-20 ENCOUNTER — Encounter: Payer: Medicare Other | Admitting: Occupational Therapy

## 2019-04-22 ENCOUNTER — Ambulatory Visit: Payer: Medicare Other

## 2019-04-22 ENCOUNTER — Encounter: Payer: Medicare Other | Admitting: Occupational Therapy

## 2019-04-27 ENCOUNTER — Ambulatory Visit: Payer: Medicare Other

## 2019-04-27 ENCOUNTER — Encounter: Payer: Medicare Other | Admitting: Speech Pathology

## 2019-04-27 ENCOUNTER — Encounter: Payer: Medicare Other | Admitting: Occupational Therapy

## 2019-04-29 ENCOUNTER — Ambulatory Visit: Payer: Medicare Other

## 2019-04-29 ENCOUNTER — Encounter: Payer: Medicare Other | Admitting: Speech Pathology

## 2019-04-29 ENCOUNTER — Encounter: Payer: Medicare Other | Admitting: Occupational Therapy

## 2019-05-04 ENCOUNTER — Encounter: Payer: Medicare Other | Admitting: Speech Pathology

## 2019-05-04 ENCOUNTER — Ambulatory Visit: Payer: Medicare Other

## 2019-05-04 ENCOUNTER — Encounter: Payer: Medicare Other | Admitting: Occupational Therapy

## 2019-05-06 ENCOUNTER — Ambulatory Visit: Payer: Medicare Other

## 2019-05-06 ENCOUNTER — Encounter: Payer: Medicare Other | Admitting: Occupational Therapy

## 2019-06-02 ENCOUNTER — Other Ambulatory Visit: Payer: Self-pay

## 2019-06-03 ENCOUNTER — Ambulatory Visit
Admission: RE | Admit: 2019-06-03 | Discharge: 2019-06-03 | Disposition: A | Discharge status: Home / Self Care | Payer: Medicare Other | Source: Ambulatory Visit | Attending: Radiation Oncology | Admitting: Radiation Oncology

## 2019-06-03 ENCOUNTER — Encounter: Payer: Self-pay | Admitting: Radiation Oncology

## 2019-06-03 ENCOUNTER — Other Ambulatory Visit: Payer: Self-pay

## 2019-06-03 VITALS — BP 128/70 | HR 59 | Temp 99.0°F | Resp 16 | Wt 150.7 lb

## 2019-06-03 DIAGNOSIS — C50211 Malignant neoplasm of upper-inner quadrant of right female breast: Secondary | ICD-10-CM

## 2019-06-03 NOTE — Progress Notes (Signed)
Radiation Oncology Follow up Note  Name: Meghan Dougherty   Date:   06/03/2019 MRN:  [id number] DOB: 1940/06/15    This 79 y.o. 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  She is seen today in routine follow-up.  Unfortunately she had a CVA this year with right-sided weakness slurred speech and apraxia.Marland Kitchen  She had mammograms back in August which I have reviewed were BI-RADS 2 benign.  Patient was never on antiestrogen therapy based on the triple negative nature of her disease.  She specifically denies breast tenderness cough or bone pain.  COMPLICATIONS OF TREATMENT: none  FOLLOW UP COMPLIANCE: keeps appointments   PHYSICAL EXAM:  BP 128/70   Pulse (!) 59   Temp 99 F (37.2 C) (Tympanic)   Resp 16   Wt 150 lb 11.2 oz (68.4 kg)   BMI 29.43 kg/m  Lungs are clear to A&P cardiac examination essentially unremarkable with regular rate and rhythm. No dominant mass or nodularity is noted in either breast in 2 positions examined. Incision is well-healed. No axillary or supraclavicular adenopathy is appreciated. Cosmetic result is excellent.  Significant right sided weakness well-developed well-nourished patient in NAD. HEENT reveals PERLA, EOMI, discs not visualized.  Oral cavity is clear. No oral mucosal lesions are identified. Neck is clear without evidence of cervical or supraclavicular adenopathy. Lungs are clear to A&P. Cardiac examination is essentially unremarkable with regular rate and rhythm without murmur rub or thrill. Abdomen is benign with no organomegaly or masses noted. Motor sensory and DTR levels are equal and symmetric in the upper and lower extremities. Cranial nerves  II through XII are grossly intact. Proprioception is intact. No peripheral adenopathy or edema is identified. . Crude visual fields are within normal range.  RADIOLOGY RESULTS: Mammograms reviewed compatible with above-stated findings  PLAN: Patient is doing well from a breast standpoint with no evidence of disease.  Present time I am going to turn follow-up care over to medical oncology.  Based on the patient's recent stroke transportation is difficult as far as her mobility.  I would be happy to reevaluate the patient anytime the future should further radiation oncology consultation be indicated.  I would like to take this opportunity to thank you for allowing me to participate in the care of your patient.Noreene Filbert, MD

## 2019-06-03 NOTE — Progress Notes (Signed)
Patient was a poor historian in regards to medication reconciliation she was advised to bring her medications to her next PCP visit to reconcile.

## 2019-06-30 ENCOUNTER — Other Ambulatory Visit: Payer: Self-pay | Admitting: Surgery

## 2019-07-13 ENCOUNTER — Other Ambulatory Visit: Payer: Self-pay

## 2019-07-13 ENCOUNTER — Encounter
Admission: RE | Admit: 2019-07-13 | Discharge: 2019-07-13 | Disposition: A | Discharge status: Home / Self Care | Payer: Medicare Other | Source: Ambulatory Visit | Attending: Surgery | Admitting: Surgery

## 2019-07-13 HISTORY — DX: Dizziness and giddiness: R42

## 2019-07-13 HISTORY — DX: Pneumonia, unspecified organism: J18.9

## 2019-07-13 NOTE — Pre-Procedure Instructions (Signed)
Pre-Admit Testing Provider Notification Note  Provider Notified: Dr. Barbaraann Cao  Notification Mode: Fax  Reason: Medical clearance: anticoagulant therapy.  Response: Fax confirmation received.  Additional Information: Placed on chart. Noted on PAT worksheet.  Signed: Beulah Gandy, RN

## 2019-07-13 NOTE — Patient Instructions (Signed)
Your procedure is scheduled [id number] 4/15 Report to Day Surgery.Medical To find out your arrival time please call [phone] between 1PM - 3PM on Wed. 4/14 .  Remember: Instructions that are not followed completely may result in serious medical risk,  up to and including death, or upon the discretion of your surgeon and anesthesiologist your  surgery may need to be rescheduled.     _X__ 1. Do not eat food after midnight the night before your procedure.                 No gum chewing or hard candies. You may drink clear liquids up to 2 hours                 before you are scheduled to arrive for your surgery- DO not drink clear                 liquids within 2 hours of the start of your surgery.                 Clear Liquids include:  water,  G2 or                  Gatorade Zero (avoid Red/Purple/Blue), Black Coffee or Tea (Do not add                 anything to coffee or tea). ___x__2.   Complete the carbohydrate drink provided to you, 2 hours before arrival.  __X__2.  On the morning of surgery brush your teeth with toothpaste and water, you                may rinse your mouth with mouthwash if you wish.  Do not swallow any toothpaste of mouthwash.     _X__ 3.  No Alcohol for 24 hours before or after surgery.   ___ 4.  Do Not Smoke or use e-cigarettes For 24 Hours Prior to Your Surgery.                 Do not use any chewable tobacco products for at least 6 hours prior to                 surgery.  ____  5.  Bring all medications with you on the day of surgery if instructed.   _x___  6.  Notify your doctor if there is any change in your medical condition      (cold, fever, infections).     Do not wear jewelry, make-up, hairpins, clips or nail polish. Do not wear lotions, powders, or perfumes. You may wear deodorant. Do not shave 48 hours prior to surgery.  Do not bring valuables to the hospital.    [facility] is not responsible for any belongings or  valuables.  Contacts, dentures or bridgework may not be worn into surgery. Leave your suitcase in the car. After surgery it may be brought to your room. For patients admitted to the hospital, discharge time is determined by your treatment team.   Patients discharged the day of surgery will not be allowed to drive home.   Make arrangements for someone to be with you for the first 24 hours of your Same Day Discharge.    Please read over the following fact sheets that you were given:    __x__ Take these medicines the morning of surgery with A SIP OF WATER:    1. levothyroxine (SYNTHROID) 75 MCG tablet  2. metoprolol tartrate (  LOPRESSOR) 100 MG tablet  3. omeprazole (PRILOSEC) 20 MG capsule  4.  5.  6.  ____ Fleet Enema (as directed)   _x___ Use CHG Soap (or wipes) as directed  ____ Use Benzoyl Peroxide Gel as instructed  ____ Use inhalers on the day of surgery  ____ Stop metformin 2 days prior to surgery    ____ Take 1/2 of usual insulin dose the night before surgery. No insulin the morning          of surgery.   _x___ Stop aspirin as per Dr.Linthavong instructs  _x___ Stop Anti-inflammatories. No ibuprofen or aleve after 4/8    May take tylenol   _x___ Stop supplements until after surgery.  ____ Bring C-Pap to the hospital.

## 2019-07-14 ENCOUNTER — Encounter
Admission: RE | Admit: 2019-07-14 | Discharge: 2019-07-14 | Disposition: A | Discharge status: Home / Self Care | Payer: Medicare Other | Source: Ambulatory Visit | Attending: Surgery | Admitting: Surgery

## 2019-07-14 DIAGNOSIS — Z01818 Encounter for other preprocedural examination: Secondary | ICD-10-CM | POA: Diagnosis not present

## 2019-07-14 LAB — COMPREHENSIVE METABOLIC PANEL
ALT: 20 U/L (ref 0–44)
AST: 25 U/L (ref 15–41)
Albumin: 4.2 g/dL (ref 3.5–5.0)
Alkaline Phosphatase: 71 U/L (ref 38–126)
Anion gap: 12 (ref 5–15)
BUN: 16 mg/dL (ref 8–23)
CO2: 23 mmol/L (ref 22–32)
Calcium: 9.1 mg/dL (ref 8.9–10.3)
Chloride: 97 mmol/L — ABNORMAL LOW (ref 98–111)
Creatinine, Ser: 0.86 mg/dL (ref 0.44–1.00)
GFR calc Af Amer: >60 mL/min (ref >60)
GFR calc non Af Amer: >60 mL/min (ref >60)
Glucose, Bld: 128 mg/dL — ABNORMAL HIGH (ref 70–99)
Potassium: 3.6 mmol/L (ref 3.5–5.1)
Sodium: 132 mmol/L — ABNORMAL LOW (ref 135–145)
Total Bilirubin: 0.8 mg/dL (ref 0.3–1.2)
Total Protein: 7.1 g/dL (ref 6.5–8.1)

## 2019-07-14 LAB — URINALYSIS, ROUTINE W REFLEX MICROSCOPIC
Bacteria, UA: NONE SEEN
Bilirubin Urine: NEGATIVE
Glucose, UA: NEGATIVE mg/dL
Hgb urine dipstick: NEGATIVE
Ketones, ur: NEGATIVE mg/dL
Nitrite: NEGATIVE
Protein, ur: NEGATIVE mg/dL
Specific Gravity, Urine: 1.017 (ref 1.005–1.030)
pH: 7 (ref 5.0–8.0)

## 2019-07-14 LAB — CBC WITH DIFFERENTIAL/PLATELET
Abs Immature Granulocytes: 0.02 10*3/uL (ref 0.00–0.07)
Basophils Absolute: 0.1 10*3/uL (ref 0.0–0.1)
Basophils Relative: 1 %
Eosinophils Absolute: 0.2 10*3/uL (ref 0.0–0.5)
Eosinophils Relative: 3 %
HCT: 38.2 % (ref 36.0–46.0)
Hemoglobin: 13.2 g/dL (ref 12.0–15.0)
Immature Granulocytes: 0 %
Lymphocytes Relative: 22 %
Lymphs Abs: 1.4 10*3/uL (ref 0.7–4.0)
MCH: 31.8 pg (ref 26.0–34.0)
MCHC: 34.6 g/dL (ref 30.0–36.0)
MCV: 92 fL (ref 80.0–100.0)
Monocytes Absolute: 0.8 10*3/uL (ref 0.1–1.0)
Monocytes Relative: 13 %
Neutro Abs: 3.9 10*3/uL (ref 1.7–7.7)
Neutrophils Relative %: 61 %
Platelets: 275 10*3/uL (ref 150–400)
RBC: 4.15 MIL/uL (ref 3.87–5.11)
RDW: 11.9 % (ref 11.5–15.5)
WBC: 6.3 10*3/uL (ref 4.0–10.5)
nRBC: 0 % (ref 0.0–0.2)

## 2019-07-14 LAB — TYPE AND SCREEN
ABO/RH(D): O POS
Antibody Screen: NEGATIVE

## 2019-07-14 LAB — SURGICAL PCR SCREEN
MRSA, PCR: NEGATIVE
Staphylococcus aureus: NEGATIVE

## 2019-07-17 NOTE — Pre-Procedure Instructions (Signed)
Medical clearance on chart-moderate risk

## 2019-07-21 ENCOUNTER — Other Ambulatory Visit
Admission: RE | Admit: 2019-07-21 | Discharge: 2019-07-21 | Disposition: A | Discharge status: Home / Self Care | Payer: Medicare Other | Source: Ambulatory Visit | Attending: Surgery | Admitting: Surgery

## 2019-07-21 DIAGNOSIS — Z01812 Encounter for preprocedural laboratory examination: Secondary | ICD-10-CM | POA: Insufficient documentation

## 2019-07-21 DIAGNOSIS — Z20822 Contact with and (suspected) exposure to covid-19: Secondary | ICD-10-CM | POA: Insufficient documentation

## 2019-07-21 LAB — SARS CORONAVIRUS 2 (TAT 6-24 HRS): SARS Coronavirus 2: NEGATIVE

## 2019-07-22 MED ORDER — CLINDAMYCIN PHOSPHATE 900 MG/50ML IV SOLN
900.0000 mg | INTRAVENOUS | Status: AC
  Administered 2019-07-23: 08:00:00 900 mg via INTRAVENOUS

## 2019-07-23 ENCOUNTER — Inpatient Hospital Stay: Payer: Medicare Other | Admitting: Anesthesiology

## 2019-07-23 ENCOUNTER — Inpatient Hospital Stay
Admission: RE | Admit: 2019-07-23 | Discharge: 2019-07-29 | DRG: 470 | Disposition: A | Discharge status: Skilled Nursing Facility | Payer: Medicare Other | Source: Ambulatory Visit | Attending: Surgery | Admitting: Surgery

## 2019-07-23 ENCOUNTER — Other Ambulatory Visit: Payer: Self-pay

## 2019-07-23 ENCOUNTER — Encounter
Admission: RE | Disposition: A | Discharge status: Skilled Nursing Facility | Payer: Self-pay | Source: Ambulatory Visit | Attending: Surgery

## 2019-07-23 ENCOUNTER — Inpatient Hospital Stay: Payer: Medicare Other

## 2019-07-23 ENCOUNTER — Encounter: Payer: Self-pay | Admitting: Surgery

## 2019-07-23 DIAGNOSIS — Z79890 Hormone replacement therapy (postmenopausal): Secondary | ICD-10-CM | POA: Diagnosis not present

## 2019-07-23 DIAGNOSIS — Z96652 Presence of left artificial knee joint: Principal | ICD-10-CM

## 2019-07-23 DIAGNOSIS — G4700 Insomnia, unspecified: Secondary | ICD-10-CM | POA: Diagnosis present

## 2019-07-23 DIAGNOSIS — I10 Essential (primary) hypertension: Secondary | ICD-10-CM | POA: Diagnosis present

## 2019-07-23 DIAGNOSIS — Z79899 Other long term (current) drug therapy: Secondary | ICD-10-CM | POA: Diagnosis not present

## 2019-07-23 DIAGNOSIS — Z7982 Long term (current) use of aspirin: Secondary | ICD-10-CM | POA: Diagnosis not present

## 2019-07-23 DIAGNOSIS — Z20822 Contact with and (suspected) exposure to covid-19: Secondary | ICD-10-CM | POA: Diagnosis present

## 2019-07-23 DIAGNOSIS — Z923 Personal history of irradiation: Secondary | ICD-10-CM | POA: Diagnosis not present

## 2019-07-23 DIAGNOSIS — I69351 Hemiplegia and hemiparesis following cerebral infarction affecting right dominant side: Secondary | ICD-10-CM

## 2019-07-23 DIAGNOSIS — E890 Postprocedural hypothyroidism: Secondary | ICD-10-CM | POA: Diagnosis present

## 2019-07-23 DIAGNOSIS — E119 Type 2 diabetes mellitus without complications: Secondary | ICD-10-CM | POA: Diagnosis present

## 2019-07-23 DIAGNOSIS — Z853 Personal history of malignant neoplasm of breast: Secondary | ICD-10-CM

## 2019-07-23 DIAGNOSIS — M1712 Unilateral primary osteoarthritis, left knee: Principal | ICD-10-CM | POA: Diagnosis present

## 2019-07-23 DIAGNOSIS — E785 Hyperlipidemia, unspecified: Secondary | ICD-10-CM | POA: Diagnosis present

## 2019-07-23 DIAGNOSIS — E876 Hypokalemia: Secondary | ICD-10-CM | POA: Diagnosis present

## 2019-07-23 HISTORY — PX: TOTAL KNEE ARTHROPLASTY: SHX125

## 2019-07-23 LAB — GLUCOSE, CAPILLARY
Glucose-Capillary: 103 mg/dL — ABNORMAL HIGH (ref 70–99)
Glucose-Capillary: 108 mg/dL — ABNORMAL HIGH (ref 70–99)

## 2019-07-23 SURGERY — ARTHROPLASTY, KNEE, TOTAL
Anesthesia: Spinal | Site: Knee | Laterality: Left

## 2019-07-23 MED ORDER — OXYCODONE HCL 5 MG/5ML PO SOLN: 5.0000 mg | Freq: Once | ORAL | Status: DC | PRN

## 2019-07-23 MED ORDER — ENOXAPARIN SODIUM 40 MG/0.4ML ~~LOC~~ SOLN
40.0000 mg | SUBCUTANEOUS | Status: DC
  Administered 2019-07-24 – 2019-07-29 (×6): 40 mg via SUBCUTANEOUS
  Filled 2019-07-23 (×6): qty 0.4

## 2019-07-23 MED ORDER — METOCLOPRAMIDE HCL 10 MG PO TABS: 5.0000 mg | ORAL_TABLET | Freq: Three times a day (TID) | ORAL | Status: DC | PRN

## 2019-07-23 MED ORDER — BUPIVACAINE-EPINEPHRINE (PF) 0.5% -1:200000 IJ SOLN
INTRAMUSCULAR | Status: DC | PRN
  Administered 2019-07-23: 30 mL via PERINEURAL

## 2019-07-23 MED ORDER — METOPROLOL TARTRATE 50 MG PO TABS
100.0000 mg | ORAL_TABLET | Freq: Two times a day (BID) | ORAL | Status: DC
  Administered 2019-07-23 – 2019-07-28 (×11): 100 mg via ORAL
  Filled 2019-07-23 (×4): qty 2
  Filled 2019-07-23: qty 4
  Filled 2019-07-23 (×7): qty 2

## 2019-07-23 MED ORDER — PROPOFOL 500 MG/50ML IV EMUL
INTRAVENOUS | Status: AC
  Filled 2019-07-23: qty 50

## 2019-07-23 MED ORDER — TRANEXAMIC ACID 1000 MG/10ML IV SOLN
INTRAVENOUS | Status: DC | PRN
  Administered 2019-07-23: 1000 mg via TOPICAL

## 2019-07-23 MED ORDER — LISINOPRIL 20 MG PO TABS
40.0000 mg | ORAL_TABLET | Freq: Every day | ORAL | Status: DC
  Administered 2019-07-24 – 2019-07-28 (×5): 40 mg via ORAL
  Filled 2019-07-23 (×8): qty 2

## 2019-07-23 MED ORDER — PROPOFOL 500 MG/50ML IV EMUL
INTRAVENOUS | Status: DC | PRN
  Administered 2019-07-23: 35 ug/kg/min via INTRAVENOUS

## 2019-07-23 MED ORDER — TRANEXAMIC ACID 1000 MG/10ML IV SOLN
INTRAVENOUS | Status: AC
  Filled 2019-07-23: qty 10

## 2019-07-23 MED ORDER — ACETAMINOPHEN 325 MG PO TABS
325.0000 mg | ORAL_TABLET | Freq: Four times a day (QID) | ORAL | Status: DC | PRN
  Administered 2019-07-26: 325 mg via ORAL
  Administered 2019-07-28: 650 mg via ORAL
  Filled 2019-07-23: qty 1
  Filled 2019-07-23: qty 2

## 2019-07-23 MED ORDER — HYDROCHLOROTHIAZIDE 25 MG PO TABS
25.0000 mg | ORAL_TABLET | Freq: Every day | ORAL | Status: DC
  Administered 2019-07-24 – 2019-07-28 (×5): 25 mg via ORAL
  Filled 2019-07-23 (×6): qty 1

## 2019-07-23 MED ORDER — PHENYLEPHRINE HCL (PRESSORS) 10 MG/ML IV SOLN
INTRAVENOUS | Status: AC
  Filled 2019-07-23: qty 1

## 2019-07-23 MED ORDER — METOCLOPRAMIDE HCL 5 MG/ML IJ SOLN: 5.0000 mg | Freq: Three times a day (TID) | INTRAMUSCULAR | Status: DC | PRN

## 2019-07-23 MED ORDER — BUPIVACAINE HCL (PF) 0.5 % IJ SOLN
INTRAMUSCULAR | Status: DC | PRN
  Administered 2019-07-23: 2.6 mL via INTRATHECAL

## 2019-07-23 MED ORDER — OXYCODONE HCL 5 MG PO TABS: 5.0000 mg | ORAL_TABLET | Freq: Once | ORAL | Status: DC | PRN

## 2019-07-23 MED ORDER — CHLORHEXIDINE GLUCONATE 4 % EX LIQD
60.0000 mL | Freq: Once | CUTANEOUS | Status: AC
  Administered 2019-07-23: 4 via TOPICAL

## 2019-07-23 MED ORDER — EPHEDRINE SULFATE 50 MG/ML IJ SOLN
INTRAMUSCULAR | Status: DC | PRN
  Administered 2019-07-23 (×3): 10 mg via INTRAVENOUS
  Administered 2019-07-23: 20 mg via INTRAVENOUS

## 2019-07-23 MED ORDER — LIDOCAINE HCL (PF) 2 % IJ SOLN
INTRAMUSCULAR | Status: AC
  Filled 2019-07-23: qty 10

## 2019-07-23 MED ORDER — BUPIVACAINE-EPINEPHRINE (PF) 0.5% -1:200000 IJ SOLN
INTRAMUSCULAR | Status: AC
  Filled 2019-07-23: qty 90

## 2019-07-23 MED ORDER — SODIUM CHLORIDE 0.9 % IV SOLN: INTRAVENOUS | Status: DC

## 2019-07-23 MED ORDER — HYDROMORPHONE HCL 1 MG/ML IJ SOLN
0.2500 mg | INTRAMUSCULAR | Status: DC | PRN
  Administered 2019-07-23: 0.5 mg via INTRAVENOUS
  Filled 2019-07-23: qty 1

## 2019-07-23 MED ORDER — DIPHENHYDRAMINE HCL 12.5 MG/5ML PO ELIX
12.5000 mg | ORAL_SOLUTION | ORAL | Status: DC | PRN
  Administered 2019-07-26: 12.5 mg via ORAL
  Administered 2019-07-27: 25 mg via ORAL
  Administered 2019-07-27: 12.5 mg via ORAL
  Administered 2019-07-28: 25 mg via ORAL
  Filled 2019-07-23: qty 5
  Filled 2019-07-23 (×2): qty 10
  Filled 2019-07-23: qty 5

## 2019-07-23 MED ORDER — MAGNESIUM HYDROXIDE 400 MG/5ML PO SUSP: 30.0000 mL | Freq: Every day | ORAL | Status: DC | PRN

## 2019-07-23 MED ORDER — CALCIUM CARBONATE-VITAMIN D 500-200 MG-UNIT PO TABS
2.0000 | ORAL_TABLET | Freq: Every day | ORAL | Status: DC
  Administered 2019-07-24 – 2019-07-29 (×6): 2 via ORAL
  Filled 2019-07-23 (×6): qty 2

## 2019-07-23 MED ORDER — BUPIVACAINE HCL (PF) 0.5 % IJ SOLN
INTRAMUSCULAR | Status: AC
  Filled 2019-07-23: qty 10

## 2019-07-23 MED ORDER — FENTANYL CITRATE (PF) 100 MCG/2ML IJ SOLN
25.0000 ug | INTRAMUSCULAR | Status: DC | PRN
  Administered 2019-07-23 (×3): 25 ug via INTRAVENOUS

## 2019-07-23 MED ORDER — FENTANYL CITRATE (PF) 100 MCG/2ML IJ SOLN
INTRAMUSCULAR | Status: DC | PRN
  Administered 2019-07-23: 25 ug via INTRAVENOUS
  Administered 2019-07-23: 50 ug via INTRAVENOUS
  Administered 2019-07-23: 25 ug via INTRAVENOUS

## 2019-07-23 MED ORDER — OXYCODONE HCL 5 MG PO TABS
5.0000 mg | ORAL_TABLET | ORAL | Status: DC | PRN
  Administered 2019-07-24: 09:00:00 10 mg via ORAL
  Administered 2019-07-24 – 2019-07-27 (×3): 5 mg via ORAL
  Filled 2019-07-23 (×2): qty 2
  Filled 2019-07-23: qty 1
  Filled 2019-07-23: qty 2
  Filled 2019-07-23: qty 1

## 2019-07-23 MED ORDER — SODIUM CHLORIDE 0.9 % IV SOLN
INTRAVENOUS | Status: DC | PRN
  Administered 2019-07-23: 60 mL

## 2019-07-23 MED ORDER — LEVOTHYROXINE SODIUM 50 MCG PO TABS
50.0000 ug | ORAL_TABLET | ORAL | Status: DC
  Administered 2019-07-25 – 2019-07-26 (×2): 50 ug via ORAL
  Filled 2019-07-23 (×5): qty 1

## 2019-07-23 MED ORDER — ATORVASTATIN CALCIUM 20 MG PO TABS
40.0000 mg | ORAL_TABLET | Freq: Every day | ORAL | Status: DC
  Administered 2019-07-23 – 2019-07-28 (×6): 40 mg via ORAL
  Filled 2019-07-23 (×6): qty 2

## 2019-07-23 MED ORDER — CLINDAMYCIN PHOSPHATE 600 MG/50ML IV SOLN
600.0000 mg | Freq: Four times a day (QID) | INTRAVENOUS | Status: AC
  Administered 2019-07-23 – 2019-07-24 (×3): 600 mg via INTRAVENOUS
  Filled 2019-07-23 (×3): qty 50

## 2019-07-23 MED ORDER — DOCUSATE SODIUM 100 MG PO CAPS
100.0000 mg | ORAL_CAPSULE | Freq: Two times a day (BID) | ORAL | Status: DC
  Administered 2019-07-23 – 2019-07-29 (×12): 100 mg via ORAL
  Filled 2019-07-23 (×12): qty 1

## 2019-07-23 MED ORDER — AMLODIPINE BESYLATE 5 MG PO TABS
5.0000 mg | ORAL_TABLET | Freq: Every day | ORAL | Status: DC
  Administered 2019-07-24 – 2019-07-28 (×5): 5 mg via ORAL
  Filled 2019-07-23 (×6): qty 1

## 2019-07-23 MED ORDER — BISACODYL 10 MG RE SUPP
10.0000 mg | Freq: Every day | RECTAL | Status: DC | PRN
  Administered 2019-07-27: 10 mg via RECTAL
  Filled 2019-07-23: qty 1

## 2019-07-23 MED ORDER — ACETAMINOPHEN 500 MG PO TABS
1000.0000 mg | ORAL_TABLET | Freq: Four times a day (QID) | ORAL | Status: AC
  Administered 2019-07-23 – 2019-07-24 (×4): 1000 mg via ORAL
  Filled 2019-07-23 (×4): qty 2

## 2019-07-23 MED ORDER — PAROXETINE HCL 10 MG PO TABS
10.0000 mg | ORAL_TABLET | Freq: Every day | ORAL | Status: DC
  Administered 2019-07-23 – 2019-07-28 (×6): 10 mg via ORAL
  Filled 2019-07-23 (×8): qty 1

## 2019-07-23 MED ORDER — PROPOFOL 10 MG/ML IV BOLUS
INTRAVENOUS | Status: DC | PRN
  Administered 2019-07-23: 20 mg via INTRAVENOUS

## 2019-07-23 MED ORDER — TRAMADOL HCL 50 MG PO TABS
50.0000 mg | ORAL_TABLET | Freq: Four times a day (QID) | ORAL | Status: DC
  Administered 2019-07-23 – 2019-07-29 (×21): 50 mg via ORAL
  Filled 2019-07-23 (×21): qty 1

## 2019-07-23 MED ORDER — FENTANYL CITRATE (PF) 100 MCG/2ML IJ SOLN
INTRAMUSCULAR | Status: AC
  Filled 2019-07-23: qty 2

## 2019-07-23 MED ORDER — ONDANSETRON HCL 4 MG PO TABS: 4.0000 mg | ORAL_TABLET | Freq: Four times a day (QID) | ORAL | Status: DC | PRN

## 2019-07-23 MED ORDER — SODIUM CHLORIDE FLUSH 0.9 % IV SOLN
INTRAVENOUS | Status: AC
  Filled 2019-07-23: qty 40

## 2019-07-23 MED ORDER — FENTANYL CITRATE (PF) 100 MCG/2ML IJ SOLN
INTRAMUSCULAR | Status: AC
  Administered 2019-07-23: 25 ug via INTRAVENOUS
  Filled 2019-07-23: qty 2

## 2019-07-23 MED ORDER — ADULT MULTIVITAMIN W/MINERALS CH
2.0000 | ORAL_TABLET | Freq: Every day | ORAL | Status: DC
  Administered 2019-07-24 – 2019-07-29 (×6): 2 via ORAL
  Filled 2019-07-23 (×6): qty 2

## 2019-07-23 MED ORDER — ONDANSETRON HCL 4 MG/2ML IJ SOLN
4.0000 mg | Freq: Four times a day (QID) | INTRAMUSCULAR | Status: DC | PRN
  Administered 2019-07-23: 17:00:00 4 mg via INTRAVENOUS
  Filled 2019-07-23: qty 2

## 2019-07-23 MED ORDER — EPHEDRINE 5 MG/ML INJ
INTRAVENOUS | Status: AC
  Filled 2019-07-23: qty 10

## 2019-07-23 MED ORDER — CLINDAMYCIN PHOSPHATE 900 MG/50ML IV SOLN
INTRAVENOUS | Status: AC
  Filled 2019-07-23: qty 50

## 2019-07-23 MED ORDER — LIDOCAINE HCL (PF) 2 % IJ SOLN
INTRAMUSCULAR | Status: DC | PRN
  Administered 2019-07-23: 25 mg

## 2019-07-23 MED ORDER — ZOLPIDEM TARTRATE 5 MG PO TABS: 2.5000 mg | ORAL_TABLET | Freq: Every evening | ORAL | Status: DC | PRN

## 2019-07-23 MED ORDER — BUPIVACAINE LIPOSOME 1.3 % IJ SUSP
INTRAMUSCULAR | Status: AC
  Filled 2019-07-23: qty 20

## 2019-07-23 MED ORDER — PANTOPRAZOLE SODIUM 40 MG PO TBEC
40.0000 mg | DELAYED_RELEASE_TABLET | Freq: Every day | ORAL | Status: DC
  Administered 2019-07-24 – 2019-07-29 (×6): 40 mg via ORAL
  Filled 2019-07-23 (×6): qty 1

## 2019-07-23 MED ORDER — LEVOTHYROXINE SODIUM 50 MCG PO TABS
75.0000 ug | ORAL_TABLET | ORAL | Status: DC
  Administered 2019-07-24 – 2019-07-29 (×4): 75 ug via ORAL
  Filled 2019-07-23 (×4): qty 1

## 2019-07-23 MED ORDER — FLEET ENEMA 7-19 GM/118ML RE ENEM: 1.0000 | ENEMA | Freq: Once | RECTAL | Status: DC | PRN

## 2019-07-23 SURGICAL SUPPLY — 61 items
BLADE SAW SAG 25X90X1.19 (BLADE) ×3 IMPLANT
BLADE SURG SZ20 CARB STEEL (BLADE) ×3 IMPLANT
BNDG ELASTIC 6X5.8 VLCR NS LF (GAUZE/BANDAGES/DRESSINGS) ×3 IMPLANT
CANISTER SUCT 1200ML W/VALVE (MISCELLANEOUS) ×3 IMPLANT
CANISTER SUCT 3000ML PPV (MISCELLANEOUS) ×3 IMPLANT
CEMENT BONE R 1X40 (Cement) ×6 IMPLANT
CEMENT VACUUM MIXING SYSTEM (MISCELLANEOUS) ×3 IMPLANT
CHLORAPREP W/TINT 26 (MISCELLANEOUS) ×3 IMPLANT
COMP FEMORAL CRUC LEFT 62.5MM (Joint) ×3 IMPLANT
COMPONENT FEMRL CRUC LT 62.5MM (Joint) IMPLANT
COOLER POLAR GLACIER W/PUMP (MISCELLANEOUS) ×3 IMPLANT
COVER MAYO STAND REUSABLE (DRAPES) ×3 IMPLANT
COVER WAND RF STERILE (DRAPES) ×3 IMPLANT
CUFF TOURN SGL QUICK 24 (TOURNIQUET CUFF)
CUFF TOURN SGL QUICK 30 (TOURNIQUET CUFF)
CUFF TRNQT CYL 24X4X16.5-23 (TOURNIQUET CUFF) IMPLANT
CUFF TRNQT CYL 30X4X21-28X (TOURNIQUET CUFF) IMPLANT
DRAPE 3/4 80X56 (DRAPES) ×3 IMPLANT
DRAPE IMP U-DRAPE 54X76 (DRAPES) ×3 IMPLANT
DRSG OPSITE POSTOP 4X10 (GAUZE/BANDAGES/DRESSINGS) ×3 IMPLANT
DRSG OPSITE POSTOP 4X8 (GAUZE/BANDAGES/DRESSINGS) ×3 IMPLANT
ELECT CAUTERY BLADE 6.4 (BLADE) ×3 IMPLANT
ELECT REM PT RETURN 9FT ADLT (ELECTROSURGICAL) ×3
ELECTRODE REM PT RTRN 9FT ADLT (ELECTROSURGICAL) ×1 IMPLANT
GLOVE BIO SURGEON STRL SZ7.5 (GLOVE) ×12 IMPLANT
GLOVE BIO SURGEON STRL SZ8 (GLOVE) ×12 IMPLANT
GLOVE BIOGEL PI IND STRL 8 (GLOVE) ×1 IMPLANT
GLOVE BIOGEL PI INDICATOR 8 (GLOVE) ×2
GLOVE INDICATOR 8.0 STRL GRN (GLOVE) ×3 IMPLANT
GOWN STRL REUS W/ TWL LRG LVL3 (GOWN DISPOSABLE) ×1 IMPLANT
GOWN STRL REUS W/ TWL XL LVL3 (GOWN DISPOSABLE) ×1 IMPLANT
GOWN STRL REUS W/TWL LRG LVL3 (GOWN DISPOSABLE) ×2
GOWN STRL REUS W/TWL XL LVL3 (GOWN DISPOSABLE) ×2
HOLDER FOLEY CATH W/STRAP (MISCELLANEOUS) IMPLANT
HOOD PEEL AWAY FLYTE STAYCOOL (MISCELLANEOUS) ×9 IMPLANT
INSERT TIB BEARING 67X12 (Insert) ×2 IMPLANT
KIT TURNOVER KIT A (KITS) ×3 IMPLANT
NDL SAFETY ECLIPSE 18X1.5 (NEEDLE) ×2 IMPLANT
NDL SPNL 20GX3.5 QUINCKE YW (NEEDLE) ×1 IMPLANT
NEEDLE HYPO 18GX1.5 SHARP (NEEDLE) ×6
NEEDLE SPNL 20GX3.5 QUINCKE YW (NEEDLE) ×3 IMPLANT
NS IRRIG 1000ML POUR BTL (IV SOLUTION) ×3 IMPLANT
PACK TOTAL KNEE (MISCELLANEOUS) ×3 IMPLANT
PAD WRAPON POLAR KNEE (MISCELLANEOUS) ×1 IMPLANT
PATELLA SERIES A (Orthopedic Implant) ×2 IMPLANT
PLATE INTERLOK 6700 (Plate) ×2 IMPLANT
PULSAVAC PLUS IRRIG FAN TIP (DISPOSABLE) ×3
SOL .9 NS 3000ML IRR  AL (IV SOLUTION) ×2
SOL .9 NS 3000ML IRR UROMATIC (IV SOLUTION) ×1 IMPLANT
STAPLER SKIN PROX 35W (STAPLE) ×3 IMPLANT
SUCTION FRAZIER HANDLE 10FR (MISCELLANEOUS) ×2
SUCTION TUBE FRAZIER 10FR DISP (MISCELLANEOUS) ×1 IMPLANT
SUT VIC AB 0 CT1 36 (SUTURE) ×9 IMPLANT
SUT VIC AB 2-0 CT1 27 (SUTURE) ×6
SUT VIC AB 2-0 CT1 TAPERPNT 27 (SUTURE) ×3 IMPLANT
SYR 10ML LL (SYRINGE) ×3 IMPLANT
SYR 20ML LL LF (SYRINGE) ×3 IMPLANT
SYR 30ML LL (SYRINGE) ×11 IMPLANT
TIP FAN IRRIG PULSAVAC PLUS (DISPOSABLE) ×1 IMPLANT
TRAY FOLEY MTR SLVR 16FR STAT (SET/KITS/TRAYS/PACK) IMPLANT
WRAPON POLAR PAD KNEE (MISCELLANEOUS) ×3

## 2019-07-23 NOTE — H&P (Signed)
Paper H&P to be scanned into permanent record. H&P reviewed and patient re-examined. No changes. 

## 2019-07-23 NOTE — Transfer of Care (Signed)
Immediate Anesthesia Transfer of Care Note  Patient: Meghan Dougherty  Procedure(s) Performed: TOTAL KNEE ARTHROPLASTY (Left Knee)  Patient Location: PACU  Anesthesia Type:Spinal  Level of Consciousness: awake and alert   Airway & Oxygen Therapy: Patient Spontanous Breathing and Patient connected to face mask oxygen  Post-op Assessment: Report given to RN and Post -op Vital signs reviewed and stable  Post vital signs: Reviewed  Last Vitals:  Vitals Value Taken Time  BP 117/51 07/23/19 0949  Temp    Pulse 61 07/23/19 0949  Resp 11 07/23/19 0949  SpO2 100 % 07/23/19 0949  Vitals shown include unvalidated device data.  Last Pain:  Vitals:   07/23/19 0611  TempSrc: Tympanic         Complications: No apparent anesthesia complications

## 2019-07-23 NOTE — Op Note (Signed)
07/23/2019  9:44 AM  Patient:   Meghan Dougherty  Pre-Op Diagnosis:   Degenerative joint disease, left knee.  Post-Op Diagnosis:   Same  Procedure:   Left TKA using all-cemented [employer] Vanguard system with a 62.5 mm PCR femur, a 67 mm tibial tray with a 12 mm anterior stabilized E-poly insert, and a 31 x 8 mm all-poly 3-pegged domed patella.  Surgeon:   Pascal Lux, MD  Assistant:   Cameron Proud, PA-C   Anesthesia:   Spinal  Findings:   As above  Complications:   None  EBL:   10 cc  Fluids:   500 cc crystalloid  UOP:   None  TT:   90 minutes at 300 mmHg  Drains:   None  Closure:   Staples  Implants:   As above  Brief Clinical Note:   The patient is an 79 year old female with a long history of progressively worsening left knee pain. The patient's symptoms have progressed despite medications, activity modification, injections, etc. The patient's history and examination were consistent with advanced degenerative joint disease of the left knee confirmed by plain radiographs. The patient presents at this time for a left total knee arthroplasty.  Procedure:   The patient was brought into the operating room. After adequate spinal anesthesia was obtained, the patient was lain in the supine position. A Foley catheter was placed by the nurse before the left lower extremity was prepped with ChloraPrep solution and draped sterilely. Preoperative antibiotics were administered. After verifying the proper laterality with a surgical timeout, the limb was exsanguinated with an Esmarch and the tourniquet inflated to 300 mmHg. A standard anterior approach to the knee was made through an approximately 7 inch incision. The incision was carried down through the subcutaneous tissues to expose superficial retinaculum. This was split the length of the incision and the medial flap elevated sufficiently to expose the medial retinaculum. The medial retinaculum was incised, leaving a 3-4 mm cuff of  tissue on the patella. This was extended distally along the medial border of the patellar tendon and proximally through the medial third of the quadriceps tendon. A subtotal fat pad excision was performed before the soft tissues were elevated off the anteromedial and anterolateral aspects of the proximal tibia to the level of the collateral ligaments. The anterior portions of the medial and lateral menisci were removed, as was the anterior cruciate ligament. With the knee flexed to 90, the external tibial guide was positioned and the appropriate proximal tibial cut made. This piece was taken to the back table where it was measured and found to be optimally replicated by a 67 mm component.  Attention was directed to the distal femur. The intramedullary canal was accessed through a 3/8" drill hole. The intramedullary guide was inserted and positioned in order to obtain a neutral flexion gap. The intercondylar block was positioned with care taken to avoid notching the anterior cortex of the femur. The appropriate cut was made. Next, the distal cutting block was placed at 5 of valgus alignment. Using the 9 mm slot, the distal cut was made. The distal femur was measured and found to be optimally replicated by the 123XX123 mm component. The 62.5 mm 4-in-1 cutting block was positioned and first the posterior, then the posterior chamfer, the anterior chamfer, and finally the anterior cuts were made. At this point, the posterior portions medial and lateral menisci were removed. A trial reduction was performed using the appropriate femoral and tibial components with first  the 10 mm and then the 12 mm insert. The 12 mm insert demonstrated excellent stability to varus and valgus stressing both in flexion and extension while permitting full extension. Patella tracking was assessed and found to be excellent. Therefore, the tibial guide position was marked on the proximal tibia. The patella thickness was measured and found to be  20 mm. Therefore, the appropriate cut was made. The patellar surface was measured and found to be optimally replicated by the 31 mm component. The three peg holes were drilled in place before the trial button was inserted. Patella tracking was assessed and found to be excellent, passing the "no thumb test". The lug holes were drilled into the distal femur before the trial component was removed, leaving only the tibial tray. The keel was then created using the appropriate tower, reamer, and punch.  The bony surfaces were prepared for cementing by irrigating them thoroughly with bacitracin saline solution via the jet lavage system. A bone plug was fashioned from some of the bone that had been removed previously and used to plug the distal femoral canal. In addition, 20 cc of Exparel diluted out to 60 cc with normal saline and 30 cc of 0.5% Sensorcaine were injected into the postero-medial and postero-lateral aspects of the knee, the medial and lateral gutter regions, and the peri-incisional tissues to help with postoperative analgesia. Meanwhile, the cement was being mixed on the back table. When it was ready, the tibial tray was cemented in first. The excess cement was removed using [employer] fast streamer. Next, the femoral component was impacted into place. Again, the excess cement was removed using [employer] fast streamer. The 12 mm trial insert was positioned and the knee brought into extension while the cement hardened. Finally, the patella was cemented into place and secured using the patellar clamp. Again, the excess cement was removed using [employer] fast streamer. Once the cement had hardened, the knee was placed through a range of motion with the findings as described above. Therefore, the trial insert was removed and, after verifying that no cement had been retained posteriorly, the permanent 12 mm anterior stabilized E-polyethylene insert was positioned and secured using the appropriate key locking mechanism. Again the knee  was placed through a range of motion with the findings as described above.  The wound was copiously irrigated with sterile saline solution using the jet lavage system before the quadriceps tendon and retinacular layer were reapproximated using #0 Vicryl interrupted sutures. The superficial retinacular layer also was closed using a running #0 Vicryl suture. A total of 10 cc of transexemic acid (TXA) was injected intra-articularly before the subcutaneous tissues were closed in several layers using 2-0 Vicryl interrupted sutures. The skin was closed using staples. A sterile honeycomb dressing was applied to the skin before the leg was wrapped with an Ace wrap to accommodate the Polar Care device. The patient was then awakened and returned to the recovery room in satisfactory condition after tolerating the procedure well.

## 2019-07-23 NOTE — Anesthesia Procedure Notes (Signed)
Spinal  Patient location during procedure: OR Staffing Performed: resident/CRNA  Anesthesiologist: Tera Mater, MD Resident/CRNA: Rolla Plate, CRNA Preanesthetic Checklist Completed: patient identified, IV checked, site marked, risks and benefits discussed, surgical consent, monitors and equipment checked, pre-op evaluation and timeout performed Spinal Block Patient position: sitting Prep: ChloraPrep and site prepped and draped Patient monitoring: heart rate, continuous pulse ox, blood pressure and cardiac monitor Approach: midline Location: L4-5 Injection technique: single-shot Needle Needle type: Whitacre and Introducer  Needle gauge: 24 G Needle length: 9 cm Additional Notes Negative paresthesia. Negative blood return. Positive free-flowing CSF. Expiration date of kit checked and confirmed. Patient tolerated procedure well, without complications.

## 2019-07-23 NOTE — Anesthesia Preprocedure Evaluation (Addendum)
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  No date: Colon polyp No date: Diabetes mellitus without complication (HCC)     Comment:  type 2 No date: Hyperlipemia No date: Hypertension No date: Hyperthyroidism No date: Hypothyroidism No date: Insomnia No date: Osteopenia 2018: Personal history of radiation therapy     Comment:  right breast cancer No date: Pneumonia 12/09/2018: Stroke [facility])     Comment:  right sided weakness, mild speech, unsteady gait   No date: Vertigo  Past Surgical History: No date: ABDOMINAL HYSTERECTOMY     Comment:  oophorectomy No date: APPENDECTOMY No date: BREAST BIOPSY; Left     Comment:  neg core No date: BREAST BIOPSY; Right     Comment:  neg  core 11/2016: BREAST BIOPSY; Right     Comment:  invasive mammary carcinoma, [place] 11/2016: BREAST BIOPSY; Right     Comment:  benign, stero 12/2016: BREAST LUMPECTOMY; Right     Comment:  invasive mammary carcinoma   Negative margins No date: CATARACT EXTRACTION W/ INTRAOCULAR LENS IMPLANT No date: CHOLECYSTECTOMY No date: COLONOSCOPY 01/15/2018: COLONOSCOPY WITH PROPOFOL; N/A     Comment:  Procedure: COLONOSCOPY WITH PROPOFOL;  Surgeon: [place],               Benay Pike, MD;  Location: [facility] ENDOSCOPY;  Service:               Gastroenterology;  Laterality: N/A; No date: EYE SURGERY     Comment:  bilateral cataracts No date: OOPHORECTOMY 01/04/2017: PARTIAL MASTECTOMY WITH NEEDLE LOCALIZATION; Right     Comment:  Procedure: PARTIAL MASTECTOMY WITH NEEDLE LOCALIZATION;               Surgeon: Leonie Green, MD;  Location: [facility] ORS;                Service: General;  Laterality: Right; 01/04/2017: SENTINEL NODE BIOPSY; Right     Comment:  Procedure: SENTINEL NODE BIOPSY;  Surgeon: Leonie Green, MD;  Location: [facility] ORS;  Service: General;                Laterality: Right; No date: THYROID SURGERY     Comment:  total thyroidectomy No date: TONSILLECTOMY No date: TOTAL THYROIDECTOMY     Reproductive/Obstetrics negative OB ROS  Anesthesia Physical Anesthesia Plan  ASA: III  Anesthesia Plan: Spinal   Post-op Pain Management:    Induction:   PONV Risk Score and Plan:   Airway Management Planned: Natural Airway and Simple Face Mask  Additional Equipment:   Intra-op Plan:   Post-operative Plan:   Informed Consent: I have reviewed the patients History and Physical, chart, labs and discussed the procedure including the risks, benefits and alternatives for the proposed anesthesia with the patient or authorized representative who has indicated his/her understanding and acceptance.     Dental Advisory Given  Plan Discussed with: Anesthesiologist, CRNA and Surgeon  Anesthesia Plan Comments:         Anesthesia Quick Evaluation

## 2019-07-23 NOTE — Evaluation (Addendum)
Physical Therapy Evaluation Patient Details Name: Meghan Dougherty MRN: [id number] DOB: January 10, 1941 Today's Date: 07/23/2019   History of Present Illness  79 y/o female s/p L TKA 4/15.  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  Clinical Impression  Pt showed great effort and did relatively well with supine exercises and bed mobility. However she very much struggled with attempts at ambulation and though she did not have any buckling she did not trust standing on R LE.  Overall she had good first PT session POD0, but is limited with functional mobility and will need short term rehab at discharge.     Follow Up Recommendations SNF    Equipment Recommendations  None recommended by PT    Recommendations for Other Services       Precautions / Restrictions Precautions Precautions: Fall;Knee Restrictions Weight Bearing Restrictions: Yes LLE Weight Bearing: Weight bearing as tolerated      Mobility  Bed Mobility Overal bed mobility: Needs Assistance Bed Mobility: Supine to Sit     Supine to sit: Min assist     General bed mobility comments: Good effort, nearly able to get to sitting w/o assist but did ultimately need min assist with trunk and L LE  Transfers Overall transfer level: Needs assistance   Transfers: Sit to/from Stand Sit to Stand: Min guard;Min assist         General transfer comment: 2 standing efforts, one requiring minimal assist one with only CGA  Ambulation/Gait Ambulation/Gait assistance: Min assist Gait Distance (Feet): 25 Feet Assistive device: Rolling walker (2 wheeled)       General Gait Details: Pt initially very much struggled to advance either foot and could not trust L knee.  With plenty of cuing and encouargement she was able to do a very few highly guarded steps, direct assist by PT on the walker increased cadence but pt still slow, hesistant and self limiting.  Stairs            Wheelchair Mobility    Modified Rankin  (Stroke Patients Only)       Balance Overall balance assessment: Needs assistance Sitting-balance support: No upper extremity supported Sitting balance-Leahy Scale: Good     Standing balance support: Bilateral upper extremity supported Standing balance-Leahy Scale: Fair Standing balance comment: reliant on the walker, poor confidence, no overt LOBs                             Pertinent Vitals/Pain Pain Assessment: 0-10 Pain Score: 5 (increases with all L knee activity)    Home Living Family/patient expects to be discharged to:: Skilled nursing facility Living Arrangements: Alone Available Help at Discharge: Family(could provide consistent supervision if needed)   Home Access: Stairs to enter Entrance Stairs-Rails: Right Entrance Stairs-Number of Steps: 2 steps with R railing from garage or 1 step (no railing) from front entrance Home Layout: One level Home Equipment: Cane - single point;Walker - 2 wheels      Prior Function Level of Independence: Independent         Comments: Pt had started using SPC 2/2 L knee pain in the last month+, had gotten back to no AD post CVA - able to manage in the home w/o assist     Hand Dominance   Dominant Hand: Right    Extremity/Trunk Assessment   Upper Extremity Assessment Upper Extremity Assessment: Overall WFL for tasks assessed(minimally decreased R UE, WFL)    Lower  Extremity Assessment Lower Extremity Assessment: RLE deficits/detail;LLE deficits/detail RLE Deficits / Details: R LE grossly 4-/5 t/o LLE Deficits / Details: expected post-op weakness       Communication   Communication: Expressive difficulties  Cognition Arousal/Alertness: Awake/alert Behavior During Therapy: WFL for tasks assessed/performed Overall Cognitive Status: Within Functional Limits for tasks assessed                                        General Comments      Exercises Total Joint Exercises Ankle  Circles/Pumps: AROM;10 reps Quad Sets: Strengthening;10 reps Short Arc Quad: AROM;10 reps Heel Slides: AROM;5 reps Hip ABduction/ADduction: AROM;Strengthening;10 reps Straight Leg Raises: AROM;10 reps Knee Flexion: PROM;5 reps Goniometric ROM: 0-92   Assessment/Plan    PT Assessment Patient needs continued PT services  PT Problem List Decreased strength;Decreased range of motion;Decreased activity tolerance;Decreased balance;Decreased mobility;Decreased coordination;Decreased cognition;Decreased safety awareness;Decreased knowledge of use of DME;Pain       PT Treatment Interventions DME instruction;Gait training;Stair training;Functional mobility training;Therapeutic activities;Balance training;Therapeutic exercise;Neuromuscular re-education;Patient/family education    PT Goals (Current goals can be found in the Care Plan section)  Acute Rehab PT Goals Patient Stated Goal: get back to walking well PT Goal Formulation: With patient Time For Goal Achievement: 08/06/19 Potential to Achieve Goals: Good    Frequency BID   Barriers to discharge        Co-evaluation               AM-PAC PT "6 Clicks" Mobility  Outcome Measure Help needed turning from your back to your side while in a flat bed without using bedrails?: A Little Help needed moving from lying on your back to sitting on the side of a flat bed without using bedrails?: A Little Help needed moving to and from a bed to a chair (including a wheelchair)?: A Lot Help needed standing up from a chair using your arms (e.g., wheelchair or bedside chair)?: A Little Help needed to walk in hospital room?: A Lot Help needed climbing 3-5 steps with a railing? : A Lot 6 Click Score: 15    End of Session Equipment Utilized During Treatment: Gait belt Activity Tolerance: Patient tolerated treatment well;Patient limited by pain Patient left: with call bell/phone within reach;with chair alarm set;with family/visitor present Nurse  Communication: Mobility status PT Visit Diagnosis: Muscle weakness (generalized) (M62.81);Difficulty in walking, not elsewhere classified (R26.2);Pain Pain - Right/Left: Left Pain - part of body: Knee    Time: [id number] PT Time Calculation (min) (ACUTE ONLY): 62 min   Charges:   PT Evaluation $PT Eval Low Complexity: 1 Low PT Treatments $Gait Training: 8-22 mins $Therapeutic Exercise: 8-22 mins        Kreg Shropshire, DPT 07/23/2019, 6:03 PM

## 2019-07-23 NOTE — OR Nursing (Signed)
Dr. Lubertha Basque aware of pt IV 22g in LFT forearm. PT difficult stick and stuck multiple times in pre-op.

## 2019-07-24 LAB — BASIC METABOLIC PANEL
Anion gap: 11 (ref 5–15)
BUN: 16 mg/dL (ref 8–23)
CO2: 22 mmol/L (ref 22–32)
Calcium: 8.3 mg/dL — ABNORMAL LOW (ref 8.9–10.3)
Chloride: 99 mmol/L (ref 98–111)
Creatinine, Ser: 0.8 mg/dL (ref 0.44–1.00)
GFR calc Af Amer: >60 mL/min (ref >60)
GFR calc non Af Amer: >60 mL/min (ref >60)
Glucose, Bld: 144 mg/dL — ABNORMAL HIGH (ref 70–99)
Potassium: 3.3 mmol/L — ABNORMAL LOW (ref 3.5–5.1)
Sodium: 132 mmol/L — ABNORMAL LOW (ref 135–145)

## 2019-07-24 LAB — CBC
HCT: 33.7 % — ABNORMAL LOW (ref 36.0–46.0)
Hemoglobin: 11.6 g/dL — ABNORMAL LOW (ref 12.0–15.0)
MCH: 32 pg (ref 26.0–34.0)
MCHC: 34.4 g/dL (ref 30.0–36.0)
MCV: 92.8 fL (ref 80.0–100.0)
Platelets: 210 10*3/uL (ref 150–400)
RBC: 3.63 MIL/uL — ABNORMAL LOW (ref 3.87–5.11)
RDW: 11.9 % (ref 11.5–15.5)
WBC: 8 10*3/uL (ref 4.0–10.5)
nRBC: 0 % (ref 0.0–0.2)

## 2019-07-24 MED ORDER — OXYCODONE HCL 5 MG PO TABS: 5.0000 mg | ORAL_TABLET | ORAL | 0 refills | Status: DC | PRN

## 2019-07-24 MED ORDER — TRAMADOL HCL 50 MG PO TABS: 50.0000 mg | ORAL_TABLET | Freq: Four times a day (QID) | ORAL | 0 refills | Status: DC

## 2019-07-24 MED ORDER — POTASSIUM CHLORIDE 20 MEQ PO PACK
40.0000 meq | PACK | Freq: Two times a day (BID) | ORAL | Status: DC
  Administered 2019-07-24 – 2019-07-29 (×11): 40 meq via ORAL
  Filled 2019-07-24 (×10): qty 2

## 2019-07-24 MED ORDER — ENOXAPARIN SODIUM 40 MG/0.4ML ~~LOC~~ SOLN: 40.0000 mg | SUBCUTANEOUS | 0 refills | Status: DC

## 2019-07-24 NOTE — NC FL2 (Signed)
[place] LEVEL OF CARE SCREENING TOOL     IDENTIFICATION  Patient Name: Meghan Dougherty Birthdate: 11-Jan-1941 Sex: female Admission Date (Current Location): 07/23/2019  [place] and [place] Number:  Engineering geologist and Address:  [facility], [address] [place], [place] [address]      Provider Number: [id number]  Attending Physician Name and Address:  Corky Mull, MD  Relative Name and Phone Number:  Vaughan Browner [phone]    Current Level of Care: Hospital Recommended Level of Care: [place] Prior Approval Number:    Date Approved/Denied:   PASRR Number: [id number]  Discharge Plan: SNF    Current Diagnoses: Patient Active Problem List   Diagnosis Date Noted  . Status post total knee replacement using cement, left 07/23/2019  . TIA (transient ischemic attack) 12/10/2018  . Primary cancer of upper inner quadrant of right female breast ([place]) 12/25/2016    Orientation RESPIRATION BLADDER Height & Weight     Self, Time, Situation, Place  Normal Continent Weight: 68 kg Height:  4' 11.5" (151.1 cm)  BEHAVIORAL SYMPTOMS/MOOD NEUROLOGICAL BOWEL NUTRITION STATUS      Continent Diet(Heart Healthy Carb Modified)  AMBULATORY STATUS COMMUNICATION OF NEEDS Skin   Extensive Assist Verbally Bruising, Normal, Surgical wounds                       Personal Care Assistance Level of Assistance  Bathing, Dressing Bathing Assistance: Limited assistance   Dressing Assistance: Limited assistance     Functional Limitations Info             SPECIAL CARE FACTORS FREQUENCY  PT (By licensed PT), OT (By licensed OT)     PT Frequency: 5 times per week OT Frequency: 5 times per week            Contractures Contractures Info: Not present    Additional Factors Info  Allergies   Allergies Info: Levaquin, Penicillins, Trazodone           Current Medications (07/24/2019):  This is the current  hospital active medication list Current Facility-Administered Medications  Medication Dose Route Frequency Provider Last Rate Last Admin  . acetaminophen (TYLENOL) tablet 325-650 mg  325-650 mg Oral Q6H PRN Poggi, Marshall Cork, MD      . amLODipine (NORVASC) tablet 5 mg  5 mg Oral Daily Poggi, Marshall Cork, MD   5 mg at 07/24/19 0902  . atorvastatin (LIPITOR) tablet 40 mg  40 mg Oral q1800 Poggi, Marshall Cork, MD   40 mg at 07/23/19 1822  . bisacodyl (DULCOLAX) suppository 10 mg  10 mg Rectal Daily PRN Poggi, Marshall Cork, MD      . calcium-vitamin D (OSCAL WITH D) 500-200 MG-UNIT per tablet 2 tablet  2 tablet Oral Daily Poggi, Marshall Cork, MD   2 tablet at 07/24/19 [phone]  . diphenhydrAMINE (BENADRYL) 12.5 MG/5ML elixir 12.5-25 mg  12.5-25 mg Oral Q4H PRN Poggi, Marshall Cork, MD      . docusate sodium (COLACE) capsule 100 mg  100 mg Oral BID Poggi, Marshall Cork, MD   100 mg at 07/24/19 0902  . enoxaparin (LOVENOX) injection 40 mg  40 mg Subcutaneous Q24H Poggi, Marshall Cork, MD   40 mg at 07/24/19 0902  . hydrochlorothiazide (HYDRODIURIL) tablet 25 mg  25 mg Oral Daily Poggi, Marshall Cork, MD   25 mg at 07/24/19 0902  . HYDROmorphone (DILAUDID) injection 0.25-0.5 mg  0.25-0.5 mg Intravenous Q3H  PRN Corky Mull, MD   0.5 mg at 07/23/19 1216  . [START ON 07/25/2019] levothyroxine (SYNTHROID) tablet 50 mcg  50 mcg Oral Once per day on Sun Sat Corky Mull, MD      . levothyroxine (SYNTHROID) tablet 75 mcg  75 mcg Oral Q MTWThF Poggi, Marshall Cork, MD   75 mcg at 07/24/19 0914  . lisinopril (ZESTRIL) tablet 40 mg  40 mg Oral Daily Poggi, Marshall Cork, MD   40 mg at 07/24/19 0902  . magnesium hydroxide (MILK OF MAGNESIA) suspension 30 mL  30 mL Oral Daily PRN Poggi, Marshall Cork, MD      . metoCLOPramide (REGLAN) tablet 5-10 mg  5-10 mg Oral Q8H PRN Poggi, Marshall Cork, MD       Or  . metoCLOPramide (REGLAN) injection 5-10 mg  5-10 mg Intravenous Q8H PRN Poggi, Marshall Cork, MD      . metoprolol tartrate (LOPRESSOR) tablet 100 mg  100 mg Oral BID Corky Mull, MD   100 mg at  07/24/19 0902  . multivitamin with minerals tablet 2 tablet  2 tablet Oral Daily Poggi, Marshall Cork, MD   2 tablet at 07/24/19 0902  . ondansetron (ZOFRAN) tablet 4 mg  4 mg Oral Q6H PRN Poggi, Marshall Cork, MD       Or  . ondansetron [facility]) injection 4 mg  4 mg Intravenous Q6H PRN Poggi, Marshall Cork, MD   4 mg at 07/23/19 1634  . oxyCODONE (Oxy IR/ROXICODONE) immediate release tablet 5-10 mg  5-10 mg Oral Q4H PRN Poggi, Marshall Cork, MD   10 mg at 07/24/19 0903  . pantoprazole (PROTONIX) EC tablet 40 mg  40 mg Oral Daily Poggi, Marshall Cork, MD   40 mg at 07/24/19 0902  . PARoxetine (PAXIL) tablet 10 mg  10 mg Oral QHS Poggi, Marshall Cork, MD   10 mg at 07/23/19 2301  . potassium chloride (KLOR-CON) packet 40 mEq  40 mEq Oral BID Lattie Corns, PA-C      . sodium phosphate (FLEET) 7-19 GM/118ML enema 1 enema  1 enema Rectal Once PRN Poggi, Marshall Cork, MD      . traMADol Veatrice Bourbon) tablet 50 mg  50 mg Oral Q6H Poggi, Marshall Cork, MD   50 mg at 07/24/19 0641  . zolpidem (AMBIEN) tablet 2.5 mg  2.5 mg Oral QHS PRN Poggi, Marshall Cork, MD         Discharge Medications: Please see discharge summary for a list of discharge medications.  Relevant Imaging Results:  Relevant Lab Results:   Additional Information SSN: [id number]  Su Hilt, RN

## 2019-07-24 NOTE — Progress Notes (Signed)
Physical Therapy Treatment Patient Details Name: Meghan Dougherty MRN: [id number] DOB: 1941/01/27 Today's Date: 07/24/2019    History of Present Illness 79 y/o female s/p L TKA 4/15.  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  Overall she stuggled this date with decreased mobitliy, decreased effort/ability with exercises and though we were able to walk farther than previous sessions she was leaning forward on the walker, struggling to keep R foot moving with any consistency and overall struggled with most tasks this date despite plenty of cuing and encouragement.  Follow Up Recommendations  SNF     Equipment Recommendations  None recommended by PT    Recommendations for Other Services       Precautions / Restrictions Precautions Precautions: Fall;Knee Restrictions LLE Weight Bearing: Weight bearing as tolerated    Mobility  Bed Mobility Overal bed mobility: Needs Assistance Bed Mobility: Supine to Sit     Supine to sit: Mod assist     General bed mobility comments: Pt was able to minimally initiate getting to EOB but even with a lot of cuing and encouragement ultimately needed more assist    Transfers Overall transfer level: Needs assistance Equipment used: Rolling walker (2 wheeled) Transfers: Sit to/from Stand Sit to Stand: Min assist         General transfer comment: Pt struggled to initiate intentional movement with most cued acts today, with tactile cuing she was able to rise with light assist  Ambulation/Gait Ambulation/Gait assistance: Min assist Gait Distance (Feet): 45 Feet Assistive device: Rolling walker (2 wheeled)       General Gait Details: Pt again with issues  initiating and coordinating R foot steppage.  With cuing of directly advancing walker she was able to "keep up" reflexively but regardless struggled with consistently being able to do this and showed poor safety awareness and had definite need of chair follow as she was unpredictible as to how she would follow through with cuing and maintain the task.    Stairs             Wheelchair Mobility    Modified Rankin (Stroke Patients Only)       Balance Overall balance assessment: Needs assistance Sitting-balance support: No upper extremity supported Sitting balance-Leahy Scale: Good Sitting balance - Comments: Pt struggling to keep eyes open even while getting up to sitting EOB, limited t/o the effort.   Standing balance support: Bilateral upper extremity supported Standing balance-Leahy Scale: Fair Standing balance comment: reliant on the walker, poor confidence, no overt LOBs but leaning forward heavily on the walker and needing direct assist to keep it from getting too far ahead of her                            Cognition Arousal/Alertness: Lethargic;Suspect due to medications Behavior During Therapy: Flat affect(consistent ideomotor apraxia t/o session) Overall Cognitive Status: Within Functional Limits for tasks assessed                                        Exercises Total Joint Exercises Ankle Circles/Pumps: AROM;10 reps Quad  Sets: Strengthening;10 reps Short Arc Quad: AAROM;10 reps;AROM Heel Slides: PROM;AAROM;10 reps(Pt struggled to initiate movement, often doing QS despite VC) Hip ABduction/ADduction: Strengthening;10 reps;AROM Straight Leg Raises: AAROM;PROM;10 reps Knee Flexion: PROM;5 reps Goniometric ROM: 1-93    General Comments        Pertinent Vitals/Pain Pain Score: 10-Worst pain ever(c/o pain in lateral knee as most )    Home Living                      Prior Function            PT Goals (current goals can  now be found in the care plan section) Progress towards PT goals: Progressing toward goals    Frequency    BID      PT Plan Current plan remains appropriate    Co-evaluation              AM-PAC PT "6 Clicks" Mobility   Outcome Measure  Help needed turning from your back to your side while in a flat bed without using bedrails?: A Little Help needed moving from lying on your back to sitting on the side of a flat bed without using bedrails?: A Little Help needed moving to and from a bed to a chair (including a wheelchair)?: A Lot Help needed standing up from a chair using your arms (e.g., wheelchair or bedside chair)?: A Little Help needed to walk in hospital room?: A Lot Help needed climbing 3-5 steps with a railing? : Total 6 Click Score: 14    End of Session Equipment Utilized During Treatment: Gait belt Activity Tolerance: Patient tolerated treatment well;Patient limited by pain Patient left: with call bell/phone within reach;with chair alarm set;with family/visitor present Nurse Communication: Mobility status PT Visit Diagnosis: Muscle weakness (generalized) (M62.81);Difficulty in walking, not elsewhere classified (R26.2);Pain Pain - Right/Left: Left Pain - part of body: Knee     Time: [id number] PT Time Calculation (min) (ACUTE ONLY): 41 min  Charges:  $Gait Training: 8-22 mins $Therapeutic Exercise: 8-22 mins $Therapeutic Activity: 8-22 mins                     Lizann Edelman R Courtnay Petrilla. DPT 07/24/2019, 4:33 PM

## 2019-07-24 NOTE — Progress Notes (Signed)
Physical Therapy Treatment Patient Details Name: Meghan Dougherty MRN: [id number] DOB: 01/12/1941 Today's Date: 07/24/2019    History of Present Illness 79 y/o female s/p L TKA 4/15.  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    PT Comments    Pt did roughly about the same as on POD0 session, she did have more c/o pain today but did manage to walk a little farther with continued difficulty getting R LE to advance.  She needed AAROM to get SLR going, but did manage to do a few reps AROM.    Follow Up Recommendations  SNF     Equipment Recommendations  None recommended by PT    Recommendations for Other Services       Precautions / Restrictions Precautions Precautions: Fall;Knee Restrictions LLE Weight Bearing: Weight bearing as tolerated    Mobility  Bed Mobility Overal bed mobility: Needs Assistance Bed Mobility: Supine to Sit     Supine to sit: Min assist     General bed mobility comments: Good effort, nearly able to get to sitting w/o assist but did ultimately need min assist with trunk and L LE  Transfers Overall transfer level: Needs assistance   Transfers: Sit to/from Stand Sit to Stand: Min guard;Min assist         General transfer comment: 2 standing efforts, one requiring minimal assist one with only CGA  Ambulation/Gait Ambulation/Gait assistance: Min assist Gait Distance (Feet): 35 Feet Assistive device: Rolling walker (2 wheeled)       General Gait Details: Pt continues to struggle with advancing R LE, reports this was how she initially was after her stroke.  Inconsistent with ability to follow cuing instructions, but did manage a more reciprocal gait briefly from time to time    Stairs             Wheelchair Mobility    Modified Rankin (Stroke Patients Only)       Balance Overall balance assessment: Needs assistance Sitting-balance support: No upper extremity supported Sitting balance-Leahy Scale: Good     Standing  balance support: Bilateral upper extremity supported Standing balance-Leahy Scale: Fair Standing balance comment: reliant on the walker, poor confidence, no overt LOBs                            Cognition Arousal/Alertness: Awake/alert Behavior During Therapy: WFL for tasks assessed/performed Overall Cognitive Status: Within Functional Limits for tasks assessed                                        Exercises Total Joint Exercises Ankle Circles/Pumps: AROM;10 reps Quad Sets: Strengthening;10 reps Short Arc Quad: AAROM;10 reps;AROM Heel Slides: AROM;5 reps(resisted leg extensions) Hip ABduction/ADduction: AROM;Strengthening;10 reps Straight Leg Raises: 10 reps;AAROM Knee Flexion: PROM;5 reps Goniometric ROM: 1-93    General Comments        Pertinent Vitals/Pain Pain Score: 10-Worst pain ever(says no pain at rest and 10/10 with any movement...)    Home Living                      Prior Function            PT Goals (current goals can now be found in the care plan section) Progress towards PT goals: Progressing toward goals    Frequency    BID  PT Plan Current plan remains appropriate    Co-evaluation              AM-PAC PT "6 Clicks" Mobility   Outcome Measure  Help needed turning from your back to your side while in a flat bed without using bedrails?: A Little Help needed moving from lying on your back to sitting on the side of a flat bed without using bedrails?: A Little Help needed moving to and from a bed to a chair (including a wheelchair)?: A Lot Help needed standing up from a chair using your arms (e.g., wheelchair or bedside chair)?: A Little Help needed to walk in hospital room?: A Lot Help needed climbing 3-5 steps with a railing? : A Lot 6 Click Score: 15    End of Session Equipment Utilized During Treatment: Gait belt Activity Tolerance: Patient tolerated treatment well;Patient limited by  pain Patient left: with call bell/phone within reach;with chair alarm set;with family/visitor present Nurse Communication: Mobility status PT Visit Diagnosis: Muscle weakness (generalized) (M62.81);Difficulty in walking, not elsewhere classified (R26.2);Pain Pain - Right/Left: Left Pain - part of body: Knee     Time: [id number] PT Time Calculation (min) (ACUTE ONLY): 29 min  Charges:  $Gait Training: 8-22 mins $Therapeutic Exercise: 8-22 mins                     Kreg Shropshire, DPT 07/24/2019, 1:54 PM

## 2019-07-24 NOTE — Discharge Summary (Addendum)
Physician Discharge Summary  Patient ID: Meghan Dougherty MRN: [id number] DOB/AGE: [birth year] 79 y.o.  Admit date: 07/23/2019 Discharge date:  07/29/19  Admission Diagnoses:  Status post total knee replacement using cement, left [Z96.652]  Discharge Diagnoses: Patient Active Problem List   Diagnosis Date Noted  . Status post total knee replacement using cement, left 07/23/2019  . TIA (transient ischemic attack) 12/10/2018  . Primary cancer of upper inner quadrant of right female breast ([place]) 12/25/2016   Past Medical History:  Diagnosis Date  . Borderline diabetes   . Breast cancer, right ([place]) 11/2016   invasive mammary carcinoma, Lumpectomy and rad tx's.   . Colon polyp   . Diabetes mellitus without complication ([place])    type 2  . Hyperlipemia   . Hypertension   . Hyperthyroidism   . Hypothyroidism   . Insomnia   . Osteopenia   . Personal history of radiation therapy 2018   right breast cancer  . Pneumonia   . Stroke ([place]) 12/09/2018   right sided weakness, mild speech, unsteady gait    . Vertigo      Transfusion: None.   Consultants (if any):   Discharged Condition: Improved  Hospital Course: Meghan Dougherty is an 79 y.o. female who was admitted 07/23/2019 with a diagnosis of primary osteoarthritis of the left knee and went to the operating room on 07/23/2019 and underwent the above named procedures.    Surgeries: Procedure(s): TOTAL KNEE ARTHROPLASTY on 07/23/2019 Patient tolerated the surgery well. Taken to PACU where she was stabilized and then transferred to the orthopedic floor.  Started on Lovenox 40mg  q 24 hrs. Foot pumps applied bilaterally at 80 mm. Heels elevated on bed with rolled towels. No evidence of DVT. Negative [place]. Physical therapy started on day #1 for gait training and transfer. OT started day #1 for ADL and assisted devices.  Patient with periods of not placing weight onto RLE, concern for new TIA.  MRI of the brain negative for  acute process.  Patient's IV was removed on POD 5  Implants: Left TKA using all-cemented [employer] Vanguard system with a 62.5 mm PCR femur, a 67 mm tibial tray with a 12 mm anterior stabilized E-poly insert, and a 31 x 8 mm all-poly 3-pegged domed patella.  She was given perioperative antibiotics:  Anti-infectives (From admission, onward)   Start     Dose/Rate Route Frequency Ordered Stop   07/23/19 1300  clindamycin (CLEOCIN) IVPB 600 mg     600 mg 100 mL/hr over 30 Minutes Intravenous Every 6 hours 07/23/19 1201 07/24/19 0712   07/23/19 0600  clindamycin (CLEOCIN) IVPB 900 mg     900 mg 100 mL/hr over 30 Minutes Intravenous On call to O.R. 07/22/19 2152 07/23/19 0815    .  She was given sequential compression devices, early ambulation, and Lovenox for DVT prophylaxis.  She benefited maximally from the hospital stay and there were no complications.    Recent vital signs:  Vitals:   07/28/19 2323 07/29/19 0722  BP: 121/60 (!) 111/50  Pulse: 74 68  Resp: 17   Temp: 98.6 F (37 C) 99 F (37.2 C)  SpO2: 97% 94%    Recent laboratory studies:  Lab Results  Component Value Date   HGB 11.8 (L) 07/25/2019   HGB 11.6 (L) 07/24/2019   HGB 13.2 07/14/2019   Lab Results  Component Value Date   WBC 9.4 07/25/2019   PLT 205 07/25/2019   Lab Results  Component Value Date  INR 1.0 12/10/2018   Lab Results  Component Value Date   NA 131 (L) 07/26/2019   K 3.5 07/26/2019   CL 94 (L) 07/26/2019   CO2 29 07/26/2019   BUN 16 07/26/2019   CREATININE 0.77 07/26/2019   GLUCOSE 99 07/26/2019   Discharge Medications:   Allergies as of 07/29/2019      Reactions   Levaquin [levofloxacin] Rash   Penicillins Rash   Has patient had a PCN reaction causing immediate rash, facial/tongue/throat swelling, SOB or lightheadedness with hypotension: No Has patient had a PCN reaction causing severe rash involving mucus membranes or skin necrosis: No Has patient had a PCN reaction that  required hospitalization: No Has patient had a PCN reaction occurring within the last 10 years: No If all of the above answers are "NO", then may proceed with Cephalosporin use.   Trazodone Other (See Comments)   Headache and nightmares      Medication List    STOP taking these medications   aspirin 325 MG tablet     TAKE these medications   amLODipine 5 MG tablet Commonly known as: NORVASC Take 5 mg by mouth daily.   atorvastatin 40 MG tablet Commonly known as: LIPITOR Take 1 tablet (40 mg total) by mouth daily at 6 PM.   calcium-vitamin D 500-200 MG-UNIT tablet Commonly known as: OSCAL WITH D Take 2 tablets by mouth daily.   enoxaparin 40 MG/0.4ML injection Commonly known as: LOVENOX Inject 0.4 mLs (40 mg total) into the skin daily.   hydrochlorothiazide 25 MG tablet Commonly known as: HYDRODIURIL Take 25 mg by mouth daily.   hydrocortisone cream 1 % Apply topically 3 (three) times daily.   levothyroxine 75 MCG tablet Commonly known as: SYNTHROID Take 75 mcg by mouth every Monday, Tuesday, Wednesday, Thursday, and Friday.   levothyroxine 50 MCG tablet Commonly known as: SYNTHROID Take 50 mcg by mouth See admin instructions. Take 50 mcg by mouth on Saturday and Sunday   lisinopril 40 MG tablet Commonly known as: ZESTRIL Take 40 mg by mouth daily.   metoprolol tartrate 100 MG tablet Commonly known as: LOPRESSOR Take 100 mg by mouth 2 (two) times daily.   multivitamin with minerals Tabs tablet Take 2 tablets by mouth daily.   omeprazole 20 MG capsule Commonly known as: PRILOSEC Take 20 mg by mouth daily as needed (heartburn).   oxyCODONE 5 MG immediate release tablet Commonly known as: Oxy IR/ROXICODONE Take 1 tablet (5 mg total) by mouth every 4 (four) hours as needed for breakthrough pain.   PARoxetine 10 MG tablet Commonly known as: PAXIL Take 10 mg by mouth at bedtime.   traMADol 50 MG tablet Commonly known as: ULTRAM Take 1-2 tablets (50-100  mg total) by mouth every 6 (six) hours as needed for moderate pain.   zolpidem 5 MG tablet Commonly known as: AMBIEN Take 2.5 mg by mouth at bedtime as needed for sleep.            Durable Medical Equipment  (From admission, onward)         Start     Ordered   07/23/19 1202  DME Bedside commode  Once    Question:  Patient needs a bedside commode to treat with the following condition  Answer:  Status post total knee replacement using cement, left   07/23/19 1201   07/23/19 1202  DME 3 n 1  Once     04 /15/21 1201   07/23/19 1202  DME Walker rolling  Once    Question Answer Comment  Walker: With [place]   Patient needs a walker to treat with the following condition Status post total knee replacement using cement, left      07/23/19 1201         Diagnostic Studies: MR BRAIN WO CONTRAST  Result Date: 07/27/2019 CLINICAL DATA:  Knee arthroplasty 4 days ago. Right leg weakness. EXAM: MRI HEAD WITHOUT CONTRAST TECHNIQUE: Multiplanar, multiecho pulse sequences of the brain and surrounding structures were obtained without intravenous contrast. COMPARISON:  12/10/2018 FINDINGS: Brain: Diffusion imaging does not show any acute or subacute infarction. There is an old lacunar infarction in the posterior limb internal capsule and radiating white matter tracts on the left with Wallerian degeneration extending into the left midbrain and brainstem. This could certainly relate to the patient's right lower extremity weakness but would be chronic. Elsewhere, no cerebellar insult is seen. Cerebral hemispheres show chronic small-vessel ischemic changes throughout the deep and subcortical white matter. No cortical or large vessel territory infarction. No mass lesion, hemorrhage, hydrocephalus or extra-axial collection. Vascular: Major vessels at the base of the brain show flow. Skull and upper cervical spine: Negative Sinuses/Orbits: Clear/normal Other: None IMPRESSION: No acute finding by MRI. Old  lacunar infarction in the posterior limb internal capsule and radiating white matter tracts on the left with Wallerian degeneration extending into the left midbrain and brainstem. Chronic small-vessel ischemic changes of the cerebral hemispheric white matter. Electronically Signed   By: Nelson Chimes M.D.   On: 07/27/2019 16:21   DG Knee Left Port  Result Date: 07/23/2019 CLINICAL DATA:  Status post left knee replacement. EXAM: PORTABLE LEFT KNEE - 1-2 VIEW COMPARISON:  No recent. FINDINGS: Total left knee replacement. Hardware intact. No acute bony abnormality identified. No evidence of fracture. IMPRESSION: Total left knee replacement with anatomic alignment. No acute bony abnormality identified. Electronically Signed   By: Marcello Moores  Register   On: 07/23/2019 10:01   Disposition: Discharge disposition: 03-Skilled [place] for discharge to SNF later today.   Contact information for follow-up providers    Lattie Corns, PA-C On 08/07/2019.   Specialty: Physician Assistant Why: at 945 and pt at [place] information: [place] [phone]            Contact information for after-discharge care    Destination    [facility] Preferred SNF.   Service: Skilled Nursing Contact information: [address] [place] [phone]                 Signed: Judson Roch PA-C 07/29/2019, 9:11 AM

## 2019-07-24 NOTE — Progress Notes (Addendum)
VAST consulted to obtain IV access. Called unit and spoke with pt's nurse, [place]. Pt's IV infiltrated and arm is bruised and swollen. Candace removed IV. Educated to elevate extremity and apply ice as needed to help with swelling and pain. Pt no longer has IV fluids and currently does not need any medication via IV route. Educated about vein preservation and not placing "just in case" IV's. Candace verbalized understanding. Instructed to place IVT consult if circumstances change and patient needs IV access.

## 2019-07-24 NOTE — TOC Progression Note (Addendum)
Transition of Care [facility]) - Progression Note    Patient Details  Name: Meghan Dougherty MRN: [id number] Date of Birth: 01/03/1941  Transition of Care [facility]) CM/SW [place], RN Phone Number: 07/24/2019, 2:31 PM  Clinical Narrative:     Reviewed bed offers for the patient with her and her son [facility], I notified [facility] an called [facility] for insurance auth ref number [id number], faxed all clinical to [facility] at [phone]       Expected Discharge Plan and Services                                                 Social Determinants of Health (SDOH) Interventions    Readmission Risk Interventions No flowsheet data found.

## 2019-07-24 NOTE — Progress Notes (Signed)
  Subjective: 1 Day Post-Op Procedure(s) (LRB): TOTAL KNEE ARTHROPLASTY (Left) Patient reports pain as moderate.   Patient is well, and has had no acute complaints or problems PT and care management to assist with discharge planning. Negative for chest pain and shortness of breath Fever: no Gastrointestinal:Negative for nausea and vomiting  Objective: Vital signs in last 24 hours: Temp:  [97.5 F (36.4 C)-98.8 F (37.1 C)] 98.4 F (36.9 C) (04/16 0432) Pulse Rate:  [60-74] 74 (04/16 0432) Resp:  [15-20] 18 (04/16 0432) BP: (114-137)/(51-85) 132/85 (04/16 0432) SpO2:  [93 %-100 %] 93 % (04/16 0432) Weight:  [68 kg] 68 kg (04/15 1212)  Intake/Output from previous day:  Intake/Output Summary (Last 24 hours) at 07/24/2019 0750 Last data filed at 07/23/2019 1845 Gross per 24 hour  Intake 865 ml  Output 10 ml  Net 855 ml    Intake/Output this shift: No intake/output data recorded.  Labs: Recent Labs    07/24/19 0511  HGB 11.6*   Recent Labs    07/24/19 0511  WBC 8.0  RBC 3.63*  HCT 33.7*  PLT 210   Recent Labs    07/24/19 0511  NA 132*  K 3.3*  CL 99  CO2 22  BUN 16  CREATININE 0.80  GLUCOSE 144*  CALCIUM 8.3*   No results for input(s): LABPT, INR in the last 72 hours.   EXAM General - Patient is Alert, Appropriate and Oriented Extremity - ABD soft Sensation intact distally Intact pulses distally Dorsiflexion/Plantar flexion intact Incision: dressing C/D/I Dressing/Incision - clean, dry, no drainage Motor Function - intact, moving foot and toes well on exam.  Abdomen is soft with normal BS this AM.  Past Medical History:  Diagnosis Date  . Borderline diabetes   . Breast cancer, right ([place]) 11/2016   invasive mammary carcinoma, Lumpectomy and rad tx's.   . Colon polyp   . Diabetes mellitus without complication ([place])    type 2  . Hyperlipemia   . Hypertension   . Hyperthyroidism   . Hypothyroidism   . Insomnia   . Osteopenia   . Personal  history of radiation therapy 2018   right breast cancer  . Pneumonia   . Stroke ([place]) 12/09/2018   right sided weakness, mild speech, unsteady gait    . Vertigo     Assessment/Plan: 1 Day Post-Op Procedure(s) (LRB): TOTAL KNEE ARTHROPLASTY (Left) Active Problems:   Status post total knee replacement using cement, left  Estimated body mass index is 29.77 kg/m as calculated from the following:   Height as of this encounter: 4' 11.5" (1.511 m).   Weight as of this encounter: 68 kg. Advance diet Up with therapy D/C IV fluids when tolerating po intake.  Labs reviewed. Hypokalemia, K+ 3.3, will supplement. Hg 11.6.  CBC and BMP ordered for tomorrow. Continue with PT today. Plan for discharge home over the weekend, current recommendation is for SNF. Patient is passing gas, work on BM.  DVT Prophylaxis - Lovenox, Foot Pumps and TED hose Weight-Bearing as tolerated to left leg  J. Cameron Proud, PA-C [facility] 07/24/2019, 7:50 AM

## 2019-07-24 NOTE — Anesthesia Postprocedure Evaluation (Signed)
Anesthesia Post Note  Patient: Ivyanna Berggren  Procedure(s) Performed: TOTAL KNEE ARTHROPLASTY (Left Knee)  Patient location during evaluation: Nursing Unit Anesthesia Type: Spinal Level of consciousness: awake, awake and alert and oriented Pain management: pain level controlled Vital Signs Assessment: post-procedure vital signs reviewed and stable Respiratory status: spontaneous breathing, nonlabored ventilation and respiratory function stable Cardiovascular status: blood pressure returned to baseline and stable Postop Assessment: no headache and no backache Anesthetic complications: no     Last Vitals:  Vitals:   07/24/19 0014 07/24/19 0432  BP: (!) 114/57 132/85  Pulse: 70 74  Resp: 18 18  Temp: 36.8 C 36.9 C  SpO2: 94% 93%    Last Pain:  Vitals:   07/24/19 0432  TempSrc: Oral  PainSc:                  Johnna Acosta

## 2019-07-24 NOTE — Discharge Instructions (Signed)

## 2019-07-24 NOTE — TOC Progression Note (Signed)
Transition of Care [facility]) - Progression Note    Patient Details  Name: Meghan Dougherty MRN: [id number] Date of Birth: 1940/12/01  Transition of Care [facility]) CM/SW [place], RN Phone Number: 07/24/2019, 1:10 PM  Clinical Narrative:    Marzella Schlein and PASSR completed, will review bed offers once obtained, Need to request insurance auth once bed choice made        Expected Discharge Plan and Services                                                 Social Determinants of Health (SDOH) Interventions    Readmission Risk Interventions No flowsheet data found.

## 2019-07-25 LAB — BASIC METABOLIC PANEL
Anion gap: 11 (ref 5–15)
BUN: 16 mg/dL (ref 8–23)
CO2: 26 mmol/L (ref 22–32)
Calcium: 8.7 mg/dL — ABNORMAL LOW (ref 8.9–10.3)
Chloride: 96 mmol/L — ABNORMAL LOW (ref 98–111)
Creatinine, Ser: 0.76 mg/dL (ref 0.44–1.00)
GFR calc Af Amer: >60 mL/min (ref >60)
GFR calc non Af Amer: >60 mL/min (ref >60)
Glucose, Bld: 121 mg/dL — ABNORMAL HIGH (ref 70–99)
Potassium: 3.5 mmol/L (ref 3.5–5.1)
Sodium: 133 mmol/L — ABNORMAL LOW (ref 135–145)

## 2019-07-25 LAB — CBC
HCT: 34 % — ABNORMAL LOW (ref 36.0–46.0)
Hemoglobin: 11.8 g/dL — ABNORMAL LOW (ref 12.0–15.0)
MCH: 31.8 pg (ref 26.0–34.0)
MCHC: 34.7 g/dL (ref 30.0–36.0)
MCV: 91.6 fL (ref 80.0–100.0)
Platelets: 205 10*3/uL (ref 150–400)
RBC: 3.71 MIL/uL — ABNORMAL LOW (ref 3.87–5.11)
RDW: 12.1 % (ref 11.5–15.5)
WBC: 9.4 10*3/uL (ref 4.0–10.5)
nRBC: 0 % (ref 0.0–0.2)

## 2019-07-25 NOTE — Progress Notes (Signed)
Physical Therapy Treatment Patient Details Name: Meghan Dougherty MRN: [id number] DOB: December 25, 1940 Today's Date: 07/25/2019    History of Present Illness 79 y/o female s/p L TKA 4/15.  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    PT Comments    Patient more alert this morning compared to last session. Able to ambulate further today but continues to have a very hard time advancing R LE and shifting towards the left. Needed max cuing and physical assist at times to shift towards left in order to complete R step through. Seemed discouraged this session but agreed to work with PT. Patient would benefit from continued management of limiting condition by skilled physical therapist to address remaining impairments and functional limitations to work towards stated goals and return to PLOF or maximal functional independence.       Follow Up Recommendations  SNF     Equipment Recommendations  None recommended by PT    Recommendations for Other Services       Precautions / Restrictions Restrictions Weight Bearing Restrictions: Yes LLE Weight Bearing: Weight bearing as tolerated    Mobility  Bed Mobility Overal bed mobility: Needs Assistance Bed Mobility: Supine to Sit     Supine to sit: Mod assist     General bed mobility comments: Patient was able to move left leg slightly towards edge of bed. Required assistance to move B LE and support R UE pulling up to sitting. Able to scoot forward to feet on floor with high level of encouragement.  Transfers Overall transfer level: Needs assistance Equipment used: Rolling walker (2 wheeled) Transfers: Sit to/from Stand Sit to Stand: Min assist;Min guard         General transfer comment: Patient required small amount of support from behind to shift forward and lift hips. Able to go stand to sit with hands on BSC rails with cuing and CGA for safety.  Ambulation/Gait Ambulation/Gait assistance: Min assist Gait Distance (Feet):  50 Feet Assistive device: Rolling walker (2 wheeled)   Gait velocity: very slow   General Gait Details: Patient with great difficulty advancing feet R > L. Required therapist assistance to advance R LE at first and had fear of L LE buckling. Better able to advance L LE. Heavy support through BUE. Required assistance with weight shift towards the left to take a step with the right. 2nd person for safety in case chair was needed. Reccomend chair access for possible urgent need to sit.   Stairs             Wheelchair Mobility    Modified Rankin (Stroke Patients Only)       Balance Overall balance assessment: Needs assistance Sitting-balance support: No upper extremity supported Sitting balance-Leahy Scale: Good Sitting balance - Comments: able to sit steady at edge of bed and on BSC   Standing balance support: Bilateral upper extremity supported Standing balance-Leahy Scale: Poor Standing balance comment: reliant on walker, occasional loss of balance during ambulation or assistance needed to facilitate weight shift                            Cognition                                              Exercises Total Joint Exercises Goniometric ROM: extension lacking  5 degrees. Flexion 90 degrees.    General Comments        Pertinent Vitals/Pain Pain Assessment: Faces Faces Pain Scale: Hurts little more Pain Location: patient reports no pain at rest, grimacing and groaning when moving L LE on bed Pain Intervention(s): Limited activity within patient's tolerance;Monitored during session;Repositioned    Home Living                      Prior Function            PT Goals (current goals can now be found in the care plan section) Acute Rehab PT Goals Patient Stated Goal: get back to walking well PT Goal Formulation: With patient Time For Goal Achievement: 08/06/19 Potential to Achieve Goals: Good Progress towards PT goals:  Progressing toward goals(able to walk a bit further)    Frequency    BID      PT Plan Current plan remains appropriate    Co-evaluation              AM-PAC PT "6 Clicks" Mobility   Outcome Measure  Help needed turning from your back to your side while in a flat bed without using bedrails?: A Little Help needed moving from lying on your back to sitting on the side of a flat bed without using bedrails?: A Little Help needed moving to and from a bed to a chair (including a wheelchair)?: A Lot Help needed standing up from a chair using your arms (e.g., wheelchair or bedside chair)?: A Little Help needed to walk in hospital room?: A Lot Help needed climbing 3-5 steps with a railing? : Total 6 Click Score: 14    End of Session Equipment Utilized During Treatment: Gait belt Activity Tolerance: Patient tolerated treatment well;Patient limited by pain;Patient limited by fatigue Patient left: with call bell/phone within reach;with chair alarm set(On [facility]) Nurse Communication: Mobility status;Other (comment)(that pt is on [facility]) PT Visit Diagnosis: Muscle weakness (generalized) (M62.81);Difficulty in walking, not elsewhere classified (R26.2);Pain Pain - Right/Left: Left Pain - part of body: Knee     Time: 0950-1020 PT Time Calculation (min) (ACUTE ONLY): 30 min  Charges:  $Gait Training: 8-22 mins $Therapeutic Activity: 8-22 mins                     Everlean Alstrom. Graylon Good, PT, DPT 07/25/19, 1:04 PM

## 2019-07-25 NOTE — Progress Notes (Signed)
Pt unable to tolerate CPM machine. Pt educated on purposes. Pt refusing at this time, removed. WCTM.

## 2019-07-25 NOTE — Progress Notes (Signed)
  Subjective: 2 Days Post-Op Procedure(s) (LRB): TOTAL KNEE ARTHROPLASTY (Left) Patient reports pain as moderate.   Patient is well, and has had no acute complaints or problems Plan is to go Rehab after hospital stay. Negative for chest pain and shortness of breath Fever: no Gastrointestinal: negative for nausea and vomiting.   Patient has not had a bowel movement.  Patient states her last bowel movement was on Thursday.  Objective: Vital signs in last 24 hours: Temp:  [97.7 F (36.5 C)-98.6 F (37 C)] 98.6 F (37 C) (04/17 0741) Pulse Rate:  [62-69] 67 (04/17 1055) Resp:  [16-17] 16 (04/17 0741) BP: (124-148)/(56-64) 137/56 (04/17 1055) SpO2:  [88 %-95 %] 95 % (04/17 0741)  Intake/Output from previous day:  Intake/Output Summary (Last 24 hours) at 07/25/2019 1237 Last data filed at 07/25/2019 0908 Gross per 24 hour  Intake 120 ml  Output 50 ml  Net 70 ml    Intake/Output this shift: Total I/O In: 120 [P.O.:120] Out: -   Labs: Recent Labs    07/24/19 0511 07/25/19 0443  HGB 11.6* 11.8*   Recent Labs    07/24/19 0511 07/25/19 0443  WBC 8.0 9.4  RBC 3.63* 3.71*  HCT 33.7* 34.0*  PLT 210 205   Recent Labs    07/24/19 0511 07/25/19 0443  NA 132* 133*  K 3.3* 3.5  CL 99 96*  CO2 22 26  BUN 16 16  CREATININE 0.80 0.76  GLUCOSE 144* 121*  CALCIUM 8.3* 8.7*   No results for input(s): LABPT, INR in the last 72 hours.   EXAM General - Patient is Alert, Appropriate and Oriented Extremity - Neurovascular intact Dorsiflexion/Plantar flexion intact Compartment soft; pillow under left knee.   Dressing/Incision -Polar Care in place and working.  Mild dried sanguinous drainage noted. Motor Function - intact, moving foot and toes well on exam.  Cardiovascular- Regular rate and rhythm, no murmurs/rubs/gallops Respiratory- Lungs clear to auscultation bilaterally Gastrointestinal- soft, nontender and active bowel sounds   Assessment/Plan: 2 Days Post-Op  Procedure(s) (LRB): TOTAL KNEE ARTHROPLASTY (Left) Active Problems:   Status post total knee replacement using cement, left  Estimated body mass index is 29.77 kg/m as calculated from the following:   Height as of this encounter: 4' 11.5" (1.511 m).   Weight as of this encounter: 68 kg. Advance diet Up with therapy Plan for discharge tomorrow to rehab facility  Remove pillow from underneath operative knee.  Heels floated.  TED hose placed on left leg.  Polar Care replaced.  Explained the importance of having a bowel movement prior to discharge.  DVT Prophylaxis - Lovenox, Ted hose and foot pumps Weight-Bearing as tolerated to left leg  Cassell Smiles, PA-C [facility] Orthopaedic Surgery 07/25/2019, 12:37 PM

## 2019-07-25 NOTE — Plan of Care (Signed)
  Problem: Education: Goal: Knowledge of General Education information will improve Description: Including pain rating scale, medication(s)/side effects and non-pharmacologic comfort measures Outcome: Progressing   Problem: Clinical Measurements: Goal: Ability to maintain clinical measurements within normal limits will improve Outcome: Progressing Goal: Will remain free from infection Outcome: Progressing Goal: Diagnostic test results will improve Outcome: Progressing   Problem: Activity: Goal: Risk for activity intolerance will decrease Outcome: Progressing   Problem: Safety: Goal: Ability to remain free from injury will improve Outcome: Progressing

## 2019-07-25 NOTE — Progress Notes (Signed)
Physical Therapy Treatment Patient Details Name: Meghan Dougherty MRN: [id number] DOB: Jul 14, 1940 Today's Date: 07/25/2019    History of Present Illness 79 y/o female s/p L TKA 4/15.  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    PT Comments    Patient alert this afternoon and participated well in physical therapy but continued to show signs of decreased cognition including confusion during turns and need for repeated cuing for sequencing of activities. Ambulated further to 75 feet with faster pace. Quite fatigued by end of ambulation. Required physical assistance to shift to the left LE in order to clear floor with R LE. Patient would benefit from continued skilled physical therapy to address remaining impairments and functional limitations to work towards stated goals and return to PLOF or maximal functional independence.      Follow Up Recommendations  SNF     Equipment Recommendations  None recommended by PT    Recommendations for Other Services       Precautions / Restrictions Precautions Precautions: Fall;Knee Restrictions Weight Bearing Restrictions: Yes LLE Weight Bearing: Weight bearing as tolerated    Mobility  Bed Mobility Overal bed mobility: Needs Assistance Bed Mobility: Supine to Sit     Supine to sit: Mod assist     General bed mobility comments: Requires assistance to move L LE towards edge of bed and to move trunk upright in log roll.  Transfers Overall transfer level: Needs assistance Equipment used: Rolling walker (2 wheeled) Transfers: Sit to/from Stand Sit to Stand: Min assist         General transfer comment: Patient required slight support to shift forward and lower to chair. Heavy cuing for hand placement and task sequencing.  Ambulation/Gait Ambulation/Gait assistance: Mod assist;+2 safety/equipment Gait Distance (Feet): 75 Feet Assistive device: Rolling walker (2 wheeled) Gait Pattern/deviations: Decreased step length -  right;Decreased weight shift to left;Trunk flexed Gait velocity: slow   General Gait Details: Patient continues with difficulty initiating steps. Requires mod A to help direct RW and shift weight to left foot to allow R swing through. 2nd person needed for safety. Patient ambulated faster this session with better R swing through with PT shiftin weight to left. Pt unable to weight shift to left without physical assistance. Pt fails to keep body close to walker, bring R LE up to walker, and gets confused when turning.   Stairs             Wheelchair Mobility    Modified Rankin (Stroke Patients Only)       Balance Overall balance assessment: Needs assistance Sitting-balance support: No upper extremity supported Sitting balance-Leahy Scale: Good Sitting balance - Comments: able to sit steady at edge of bed after initially being unsteady   Standing balance support: Bilateral upper extremity supported Standing balance-Leahy Scale: Poor Standing balance comment: reliant on walker, occasional loss of balance during ambulation and physical assistance needed to facilitate weight shift.                            Cognition Arousal/Alertness: Awake/alert Behavior During Therapy: WFL for tasks assessed/performed;Anxious(consistent ideomotor apraxia t/o session) Overall Cognitive Status: Impaired/Different from baseline Area of Impairment: Attention;Safety/judgement;Awareness;Problem solving                               General Comments: Patient more alert but son states she is not at baseline. Requires encouragement  and strong cuing to perform activities.      Exercises Total Joint Exercises Ankle Circles/Pumps: AROM;20 reps;Right;Left;Seated Short Arc Quad: AROM;Right;Left;15 reps;Seated Knee Flexion: AROM;Left;15 reps;Seated Goniometric ROM: extension lacking 5 degrees. Flexion 100 degrees    General Comments        Pertinent Vitals/Pain Pain  Assessment: Faces Faces Pain Scale: Hurts a little bit Pain Location: no pain at rest, grimacin and groaning during movement Pain Intervention(s): Limited activity within patient's tolerance;Monitored during session;Repositioned    Home Living                      Prior Function            PT Goals (current goals can now be found in the care plan section) Acute Rehab PT Goals Patient Stated Goal: get back to walking well PT Goal Formulation: With patient Time For Goal Achievement: 08/06/19 Potential to Achieve Goals: Good Progress towards PT goals: Progressing toward goals    Frequency    BID      PT Plan Current plan remains appropriate    Co-evaluation              AM-PAC PT "6 Clicks" Mobility   Outcome Measure  Help needed turning from your back to your side while in a flat bed without using bedrails?: A Little Help needed moving from lying on your back to sitting on the side of a flat bed without using bedrails?: A Little Help needed moving to and from a bed to a chair (including a wheelchair)?: A Lot Help needed standing up from a chair using your arms (e.g., wheelchair or bedside chair)?: A Little Help needed to walk in hospital room?: A Lot Help needed climbing 3-5 steps with a railing? : Total 6 Click Score: 14    End of Session Equipment Utilized During Treatment: Gait belt Activity Tolerance: Patient tolerated treatment well;Patient limited by pain;Patient limited by fatigue Patient left: with call bell/phone within reach;with chair alarm set;in chair Nurse Communication: Mobility status PT Visit Diagnosis: Muscle weakness (generalized) (M62.81);Difficulty in walking, not elsewhere classified (R26.2);Pain Pain - Right/Left: Left Pain - part of body: Knee     Time: 1355-1435 PT Time Calculation (min) (ACUTE ONLY): 40 min  Charges:  $Gait Training: 8-22 mins $Therapeutic Exercise: 8-22 mins $Therapeutic Activity: 8-22 mins                      Everlean Alstrom. Graylon Good, PT, DPT 07/25/19, 3:11 PM

## 2019-07-26 LAB — BASIC METABOLIC PANEL
Anion gap: 8 (ref 5–15)
BUN: 16 mg/dL (ref 8–23)
CO2: 29 mmol/L (ref 22–32)
Calcium: 8.6 mg/dL — ABNORMAL LOW (ref 8.9–10.3)
Chloride: 94 mmol/L — ABNORMAL LOW (ref 98–111)
Creatinine, Ser: 0.77 mg/dL (ref 0.44–1.00)
GFR calc Af Amer: >60 mL/min (ref >60)
GFR calc non Af Amer: >60 mL/min (ref >60)
Glucose, Bld: 99 mg/dL (ref 70–99)
Potassium: 3.5 mmol/L (ref 3.5–5.1)
Sodium: 131 mmol/L — ABNORMAL LOW (ref 135–145)

## 2019-07-26 NOTE — Plan of Care (Signed)

## 2019-07-26 NOTE — Progress Notes (Signed)
Physical Therapy Treatment Patient Details Name: Meghan Dougherty MRN: [id number] DOB: 29-Jul-1940 Today's Date: 07/26/2019    History of Present Illness 79 y/o female s/p L TKA 4/15.  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    PT Comments    Patient is attempted two times in AM and then again after lunch. She is struggling with keeping her eyes open during treatment and staying on task with need of multiple repeating of commands for completing a task such as moving her leg over to the side of the bed for supine <> sit mobility. She needs max assist for supine <> sit mobility. She performs transfers sit to stand from elevated bed after many attempts to transfer with max verbal cues with Rw. She is ambulates 3 steps with Rw and max VC for sequencing and encouragement. She is taking very short (inch by inch) steps with RLE and is mostly standing still due to inability to move her legs. She transfers stand to sit at edge of bed and needs max assist for sit to supine bed mobility. Patient performs LLE exercises active assisted / PROM due to patient not able to perform SLR, SAQ, heel slides, hip abd/add actively. Patients nurse entered the room and was told that pateint is having reports of being itchy, is having poor ability to participate in PT due to being lethargic and having difficultly keeping her eyes open.  Patient will continue to benefit in skilled PT to improve mobility and strength.   Follow Up Recommendations  SNF     Equipment Recommendations  None recommended by PT    Recommendations for Other Services       Precautions / Restrictions Restrictions Weight Bearing Restrictions: Yes LLE Weight Bearing: Weight bearing as tolerated    Mobility  Bed Mobility Overal bed mobility: Needs Assistance Bed Mobility: Supine to Sit;Sit to Supine     Supine to sit: Mod assist Sit to supine: Max assist   General bed mobility comments: (needs hand over hand assist for movement  and extra time)  Transfers Overall transfer level: Needs assistance Equipment used: Rolling walker (2 wheeled) Transfers: Sit to/from Stand Sit to Stand: Mod assist         General transfer comment: many attempts for sit to stand(elevated bed for transfer, slow, extra time)  Ambulation/Gait Ambulation/Gait assistance: Mod assist Gait Distance (Feet): 3 Feet Assistive device: Rolling walker (2 wheeled) Gait Pattern/deviations: (short RLE step - inche by inch to equal one step) Gait velocity: (needing max verbal encouragement for each step)   General Gait Details: (poor RLE step quality)   Stairs             Wheelchair Mobility    Modified Rankin (Stroke Patients Only)       Balance Overall balance assessment: Modified Independent   Sitting balance-Leahy Scale: Good     Standing balance support: Bilateral upper extremity supported Standing balance-Leahy Scale: Poor Standing balance comment: (static standing with RW BUE support)                            Cognition Arousal/Alertness: Suspect due to medications Behavior During Therapy: Flat affect Overall Cognitive Status: Impaired/Different from baseline Area of Impairment: Attention;Following commands;Awareness                       Following Commands: Follows one step commands inconsistently;Follows one step commands with increased time  General Comments: (eyes closed 75% of treatment, not following commands consist)      Exercises Total Joint Exercises Quad Sets: (pt unable due to poor attention span) Short Arc Quad: AAROM;Left;10 reps Heel Slides: AAROM;Left;10 reps Straight Leg Raises: AAROM;Left;10 reps    General Comments        Pertinent Vitals/Pain Pain Assessment: Faces Faces Pain Scale: Hurts whole lot Pain Location: left knee Pain Intervention(s): Limited activity within patient's tolerance;Monitored during session    Home Living                       Prior Function            PT Goals (current goals can now be found in the care plan section) Acute Rehab PT Goals Patient Stated Goal: get back to walking well PT Goal Formulation: With patient Time For Goal Achievement: 08/06/19 Progress towards PT goals: Progressing toward goals    Frequency    BID      PT Plan Current plan remains appropriate    Co-evaluation              AM-PAC PT "6 Clicks" Mobility   Outcome Measure  Help needed turning from your back to your side while in a flat bed without using bedrails?: A Lot Help needed moving from lying on your back to sitting on the side of a flat bed without using bedrails?: A Lot Help needed moving to and from a bed to a chair (including a wheelchair)?: A Lot Help needed standing up from a chair using your arms (e.g., wheelchair or bedside chair)?: A Lot Help needed to walk in hospital room?: A Lot Help needed climbing 3-5 steps with a railing? : Total 6 Click Score: 11    End of Session Equipment Utilized During Treatment: Gait belt Activity Tolerance: Patient limited by lethargy;Patient limited by pain Patient left: in bed;with bed alarm set;with nursing/sitter in room;with family/visitor present Nurse Communication: Patient requests pain meds;Other (comment)(pt is itchy, decreased ability to participate in PT, drowsy) PT Visit Diagnosis: Muscle weakness (generalized) (M62.81);Difficulty in walking, not elsewhere classified (R26.2);Pain     Time: 1210-1240 PT Time Calculation (min) (ACUTE ONLY): 30 min  Charges:  $Therapeutic Exercise: 8-22 mins $Therapeutic Activity: 8-22 mins                       Sharlett Lienemann S, PT DPt 07/26/2019, 1:32 PM

## 2019-07-26 NOTE — Progress Notes (Addendum)
  Subjective: 3 Days Post-Op Procedure(s) (LRB): TOTAL KNEE ARTHROPLASTY (Left) Patient reports pain as moderate.   Patient is well, but has had some minor complaints of increased itching. Plan is to go Rehab after hospital stay. Negative for chest pain and shortness of breath Fever: no Gastrointestinal: negative for nausea and vomiting.   Patient has not had a bowel movement.  Objective: Vital signs in last 24 hours: Temp:  [98.2 F (36.8 C)-98.7 F (37.1 C)] 98.2 F (36.8 C) (04/18 0731) Pulse Rate:  [63-70] 67 (04/18 0731) Resp:  [16-20] 20 (04/18 0731) BP: (119-136)/(54-61) 119/57 (04/18 0731) SpO2:  [94 %-96 %] 95 % (04/18 0731)  Intake/Output from previous day:  Intake/Output Summary (Last 24 hours) at 07/26/2019 1318 Last data filed at 07/26/2019 0619 Gross per 24 hour  Intake 200 ml  Output 125 ml  Net 75 ml    Intake/Output this shift: No intake/output data recorded.  Labs: Recent Labs    07/24/19 0511 07/25/19 0443  HGB 11.6* 11.8*   Recent Labs    07/24/19 0511 07/25/19 0443  WBC 8.0 9.4  RBC 3.63* 3.71*  HCT 33.7* 34.0*  PLT 210 205   Recent Labs    07/25/19 0443 07/26/19 0438  NA 133* 131*  K 3.5 3.5  CL 96* 94*  CO2 26 29  BUN 16 16  CREATININE 0.76 0.77  GLUCOSE 121* 99  CALCIUM 8.7* 8.6*   No results for input(s): LABPT, INR in the last 72 hours.   EXAM General - Patient is Appropriate and Oriented, somewhat lethargic Extremity - Neurovascular intact Dorsiflexion/Plantar flexion intact Compartment soft Dressing/Incision -dried sanguinous drainage noted on dressing. Motor Function - intact, moving foot and toes well on exam.  Cardiovascular- Regular rate and rhythm, no murmurs/rubs/gallops Respiratory- Lungs clear to auscultation bilaterally Gastrointestinal- soft, nontender and active bowel sounds   Assessment/Plan: 3 Days Post-Op Procedure(s) (LRB): TOTAL KNEE ARTHROPLASTY (Left) Active Problems:   Status post total knee  replacement using cement, left  Estimated body mass index is 29.77 kg/m as calculated from the following:   Height as of this encounter: 4' 11.5" (1.511 m).   Weight as of this encounter: 68 kg. Advance diet Up with therapy Plan for discharge tomorrow to rehab facility.   Fresh honeycomb dressing applied. We will avoid use of oxycodone and use tramadol instead.   DVT Prophylaxis - Lovenox, Ted hose and foot pumps Weight-Bearing as tolerated to left leg  Cassell Smiles, PA-C [facility] Orthopaedic Surgery 07/26/2019, 1:18 PM

## 2019-07-27 ENCOUNTER — Inpatient Hospital Stay: Payer: Medicare Other

## 2019-07-27 LAB — SARS CORONAVIRUS 2 (TAT 6-24 HRS): SARS Coronavirus 2: NEGATIVE

## 2019-07-27 MED ORDER — HYDROCORTISONE 1 % EX CREA
TOPICAL_CREAM | Freq: Four times a day (QID) | CUTANEOUS | Status: DC
  Filled 2019-07-27: qty 28

## 2019-07-27 NOTE — TOC Progression Note (Signed)
Transition of Care [facility]) - Progression Note    Patient Details  Name: Meghan Dougherty MRN: [id number] Date of Birth: Feb 17, 1941  Transition of Care [facility]) CM/SW [place], RN Phone Number: 07/27/2019, 10:37 AM  Clinical Narrative:     [facility] called and stated due to the insurance auth request was requested post op day 1, the review has to go to the Medical Director, they requested updated PT notes. Faxed updated clinical to [phone] ref number [id number]       Expected Discharge Plan and Services                                                 Social Determinants of Health (SDOH) Interventions    Readmission Risk Interventions No flowsheet data found.

## 2019-07-27 NOTE — Progress Notes (Signed)
Physical Therapy Treatment Patient Details Name: Meghan Dougherty MRN: [id number] DOB: April 13, 1940 Today's Date: 07/27/2019    History of Present Illness 79 y/o female s/p L TKA 4/15.  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    PT Comments    Pt was agreeable to PT this morning. Pt had occasional extreme difficulty with following commands and was unable to ambulate without physical assistance due to inability to move her R LE in standing. In supine, pt did not have difficulty with RLE movement but in standing, pt was completely unable to move the RLE despite max verbal, visual, and tactile cueing. Pt verbally expressed understanding of the task but was unable to show any signs of attempting RLE movement such as weight shifting to the L and pressing down through the RW. Nsg was notified of the functional decline and inability to consistently follow commands. It is unclear what role medication, previous CVA, baseline cognition, and motivation play in her inability to ambulate this morning. For her surgical knee, the L knee, pt required a lot of motivation and cueing to promote ROM with supine therex. Pt had very minimal active ROM with most exercises but was able to achieve much greater acrs of motion with AAROM. Pt will benefit from PT services in a SNF setting upon discharge to safely address deficits listed in patient problem list for decreased caregiver assistance and eventual return to PLOF.     Follow Up Recommendations  SNF     Equipment Recommendations  None recommended by PT    Recommendations for Other Services       Precautions / Restrictions Precautions Precautions: Fall;Knee Restrictions Weight Bearing Restrictions: Yes LLE Weight Bearing: Weight bearing as tolerated    Mobility  Bed Mobility Overal bed mobility: Needs Assistance Bed Mobility: Supine to Sit;Sit to Supine     Supine to sit: Supervision(Cueing provided to use bed rails) Sit to supine: Mod  assist;+2 for physical assistance(Pt was not following commands and required mod A +2)      Transfers Overall transfer level: Needs assistance Equipment used: Rolling walker (2 wheeled) Transfers: Sit to/from Stand Sit to Stand: Min guard;+2 safety/equipment            Ambulation/Gait Ambulation/Gait assistance: Mod assist(for R foot placement) Gait Distance (Feet): 2 Feet Assistive device: Rolling walker (2 wheeled)   Gait velocity: Very slow   General Gait Details: Unable to move R leg despite various attempts to provided verbal, visual, and tactile cueing. Pt verbally expressed understanding of the task but was unable to follow motor commands for the R leg in standing. With physical assistance to initate the motion, pt was able to contribute some effort when sliding the R foot on the ground several inches at a time   Stairs             Wheelchair Mobility    Modified Rankin (Stroke Patients Only)       Balance   Sitting-balance support: No upper extremity supported;Feet supported Sitting balance-Leahy Scale: Good Sitting balance - Comments: able to sit steady at edge of bed   Standing balance support: Bilateral upper extremity supported Standing balance-Leahy Scale: Fair Standing balance comment: In static standing and with small steps at EOB, pt used a light asssit through the RW and had no instances of LOB                            Cognition  Arousal/Alertness: Lethargic Behavior During Therapy: Flat affect Overall Cognitive Status: Impaired/Different from baseline Area of Impairment: Following commands                       Following Commands: Follows one step commands inconsistently;Follows multi-step commands inconsistently     Problem Solving: Difficulty sequencing General Comments: Pt inconsistently followed commands t/o the session but expressed several times that she was trying to following commands but could not       Exercises Total Joint Exercises Ankle Circles/Pumps: AROM;Strengthening;Both;10 reps Quad Sets: Strengthening;Left;5 reps(max verbal and tactile cueing for each rep, very minimal contraction <50% when instructed to do the exercise) Short Arc Quad: AAROM;Left;10 reps(Over pressure applied at bottom to facilitate knee flexion) Heel Slides: AAROM;Left;10 reps(Max verbal cueing t/o. Pt would occasionally begin resisting against AAROM and appeared to struggle to understand the task. When pt was understanding the task intermittently, iver pressure was provided at end ROM to facilitate more knee flexion) Straight Leg Raises: AAROM;Left;10 reps(Pt appeared to occasionally struggle to understand the task and required max verbal cueing) Long Arc Quad: AROM;Strengthening;Both;10 reps(Pt required cueing and motivation to initate the motion and later, to continue the exercise.) Goniometric ROM: L knee: Extension lacking 6 degrees, flexion 95 deg Marching in Standing: AROM;Left;10 reps(Very small marches on left. No foot clearence but visibly seen deweighting L leg to initate march. With R foot, pt was completely unable to initate the task despite providing multi-modal cueing many times)    General Comments        Pertinent Vitals/Pain Pain Assessment: 0-10 Pain Score: 5  Pain Location: left knee Pain Descriptors / Indicators: Aching;Sore Pain Intervention(s): Monitored during session(Nsg planned on giving meds at end of session -nsg notifed when session finished)    Home Living                      Prior Function            PT Goals (current goals can now be found in the care plan section) Progress towards PT goals: Not progressing toward goals - comment(Pt was unable to ambulate secondary to difficulty with following commands)    Frequency    BID      PT Plan Current plan remains appropriate    Co-evaluation              AM-PAC PT "6 Clicks" Mobility   Outcome  Measure  Help needed turning from your back to your side while in a flat bed without using bedrails?: A Little Help needed moving from lying on your back to sitting on the side of a flat bed without using bedrails?: A Little Help needed moving to and from a bed to a chair (including a wheelchair)?: A Lot Help needed standing up from a chair using your arms (e.g., wheelchair or bedside chair)?: A Little Help needed to walk in hospital room?: Total Help needed climbing 3-5 steps with a railing? : Total 6 Click Score: 13    End of Session Equipment Utilized During Treatment: Gait belt Activity Tolerance: Patient limited by lethargy Patient left: in bed;with family/visitor present;with SCD's reapplied;with bed alarm set;with call bell/phone within reach Nurse Communication: Mobility status;Other (comment)(Pt had was occasionally completely unable to follow commands and had extreme difficulty moving her R leg when standing) PT Visit Diagnosis: Muscle weakness (generalized) (M62.81);Difficulty in walking, not elsewhere classified (R26.2);Pain Pain - Right/Left: Left Pain - part of body: Knee  Time: [id number] PT Time Calculation (min) (ACUTE ONLY): 42 min  Charges:                        Annabelle Harman, SPT 07/27/19 11:24 AM

## 2019-07-27 NOTE — Progress Notes (Signed)
PT Cancellation Note  Patient Details Name: Meghan Dougherty MRN: [id number] DOB: Jul 14, 1940   Cancelled Treatment:    Reason Eval/Treat Not Completed: Medical issues which prohibited therapy; Pt found with active orders for MRI.  Nursing requested PT hold at this time pending results of MRI.  Will attempt to see pt at a future date/time as medically appropriate.    Linus Salmons PT, DPT 07/27/19, 3:03 PM

## 2019-07-27 NOTE — Progress Notes (Signed)
Physical Therapy Treatment Patient Details Name: Meghan Dougherty MRN: [id number] DOB: 11-Jun-1940 Today's Date: 07/27/2019    History of Present Illness 79 y/o female s/p L TKA 4/15.  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    PT Comments    MRI received and per nsg, PT okay to work with pt. Pt agreeable to PT this afternoon. Due to inability to ambulate during previous session, therex was skipped to maximize energy during ambulation attempt. Pt initially was unable to move her RLE and after further questioning, pt stated that she is fearful to put her full bodyweight through her L knee. With max cueing and encouragement, pt was able to ambulate 4' before requesting to sit down secondary to arm fatigue. Pt did not weight shift towards her L side unless max encouragement was provided. Given what pt expressed throughout the session, pt appears to be self-limited secondary to fear and pain than previous concerns of inability to follow commands and declining cognitive levels. Pt was educated on importance of weight-bearing through her LLE to achieve her desired outcome from the surgery. Pt will benefit from PT services in a SNF setting upon discharge to safely address deficits listed in patient problem list for decreased caregiver assistance and eventual return to PLOF.     Follow Up Recommendations  SNF     Equipment Recommendations  None recommended by PT    Recommendations for Other Services       Precautions / Restrictions Precautions Precautions: Fall;Knee Restrictions Weight Bearing Restrictions: Yes LLE Weight Bearing: Weight bearing as tolerated    Mobility  Bed Mobility Overal bed mobility: Needs Assistance Bed Mobility: Supine to Sit     Supine to sit: Min assist     General bed mobility comments: Min A to help scoot to the front of the bed  Transfers Overall transfer level: Needs assistance Equipment used: Rolling walker (2 wheeled) Transfers: Sit  to/from Stand Sit to Stand: Supervision         General transfer comment: Poor eccentric control when sitting down after ambulating  Ambulation/Gait Ambulation/Gait assistance: Min guard Gait Distance (Feet): 4 Feet Assistive device: Rolling walker (2 wheeled)   Gait velocity: Very slow   General Gait Details: Max cueing and encouragament to facilitate R swing. Pt expressed fear avoidence of left single leg stance   Stairs             Wheelchair Mobility    Modified Rankin (Stroke Patients Only)       Balance Overall balance assessment: Needs assistance Sitting-balance support: No upper extremity supported;Feet supported Sitting balance-Leahy Scale: Good Sitting balance - Comments: able to sit steady at edge of bed   Standing balance support: Bilateral upper extremity supported Standing balance-Leahy Scale: Fair Standing balance comment: In static standing and with small steps at EOB, pt used a mod asssit through the RW and had no instances of LOB                            Cognition Arousal/Alertness: Awake/alert Behavior During Therapy: Flat affect                                   General Comments: Pt consistently followed directions this afternoon      Exercises Other Exercises Other Exercises: Education on importance of WB through the LLE Other Exercises: Educated on  WBS    General Comments        Pertinent Vitals/Pain Pain Location: left knee Pain Descriptors / Indicators: Aching;Sore Pain Intervention(s): Monitored during session;Premedicated before session    Home Living                      Prior Function            PT Goals (current goals can now be found in the care plan section) Progress towards PT goals: Progressing toward goals    Frequency    BID      PT Plan Current plan remains appropriate    Co-evaluation              AM-PAC PT "6 Clicks" Mobility   Outcome Measure   Help needed turning from your back to your side while in a flat bed without using bedrails?: A Little Help needed moving from lying on your back to sitting on the side of a flat bed without using bedrails?: A Little Help needed moving to and from a bed to a chair (including a wheelchair)?: A Lot Help needed standing up from a chair using your arms (e.g., wheelchair or bedside chair)?: A Little Help needed to walk in hospital room?: A Lot Help needed climbing 3-5 steps with a railing? : Total 6 Click Score: 14    End of Session Equipment Utilized During Treatment: Gait belt Activity Tolerance: Patient limited by pain Patient left: in bed;with family/visitor present;with SCD's reapplied;with bed alarm set;with call bell/phone within reach Nurse Communication: Mobility status;Other (comment)(Call bell not lit up) PT Visit Diagnosis: Muscle weakness (generalized) (M62.81);Difficulty in walking, not elsewhere classified (R26.2);Pain Pain - Right/Left: Left Pain - part of body: Knee     Time: 1630-1700 PT Time Calculation (min) (ACUTE ONLY): 30 min  Charges:                        Annabelle Harman, SPT 07/27/19 5:20 PM

## 2019-07-27 NOTE — Progress Notes (Signed)
Subjective: 4 Days Post-Op Procedure(s) (LRB): TOTAL KNEE ARTHROPLASTY (Left) Patient reports pain as mild.   Patient is well, did have some confusion yesterday and this morning. Plan is to go Rehab after hospital stay. Negative for chest pain and shortness of breath Fever: no Gastrointestinal: negative for nausea and vomiting.   Patient has not had a bowel movement.  Objective: Vital signs in last 24 hours: Temp:  [98.2 F (36.8 C)-99.2 F (37.3 C)] 98.2 F (36.8 C) (04/19 0741) Pulse Rate:  [70-76] 70 (04/19 0741) Resp:  [16-20] 16 (04/19 0741) BP: (111-116)/(52-60) 114/60 (04/19 0741) SpO2:  [90 %-95 %] 95 % (04/19 0741)  Intake/Output from previous day:  Intake/Output Summary (Last 24 hours) at 07/27/2019 1255 Last data filed at 07/27/2019 1015 Gross per 24 hour  Intake 440 ml  Output --  Net 440 ml    Intake/Output this shift: Total I/O In: 240 [P.O.:240] Out: -   Labs: Recent Labs    07/25/19 0443  HGB 11.8*   Recent Labs    07/25/19 0443  WBC 9.4  RBC 3.71*  HCT 34.0*  PLT 205   Recent Labs    07/25/19 0443 07/26/19 0438  NA 133* 131*  K 3.5 3.5  CL 96* 94*  CO2 26 29  BUN 16 16  CREATININE 0.76 0.77  GLUCOSE 121* 99  CALCIUM 8.7* 8.6*   No results for input(s): LABPT, INR in the last 72 hours.  EXAM General - Patient is Appropriate and Oriented . Neuro: Patient can smile, grit teeth, poke out cheeks, stick tongue out and more it side to side. No facial asymetry or numbness. Extremity - Neurovascular intact Dorsiflexion/Plantar flexion intact Compartment soft Dressing/Incision -No drainage noted to the left knee. Motor Function - intact, moving foot and toes well on exam.  Cardiovascular- Regular rate and rhythm, no murmurs/rubs/gallops Respiratory- Lungs clear to auscultation bilaterally Gastrointestinal- soft, nontender and active bowel sounds  Assessment/Plan: 4 Days Post-Op Procedure(s) (LRB): TOTAL KNEE ARTHROPLASTY  (Left) Active Problems:   Status post total knee replacement using cement, left  Estimated body mass index is 29.77 kg/m as calculated from the following:   Height as of this encounter: 4' 11.5" (1.511 m).   Weight as of this encounter: 68 kg. Advance diet Up with therapy   Patient with confusion this AM and yesterday. PT note reports she didn't want to move or activiate the right leg.  Currently moving without issues in bed. Friend in room reports that the patient is at her baseline. Will hold on MRI of the brain for now, continue with tramadol for pain. Added hydrocortisone cream. Continue to work on BM. Discharge to SNF when insurance has approved transfer.   DVT Prophylaxis - Lovenox, Ted hose and foot pumps Weight-Bearing as tolerated to left leg  J. Cameron Proud, PA-C [facility] 07/27/2019, 12:55 PM

## 2019-07-27 NOTE — Progress Notes (Signed)
Patient still having trouble lifting her right foot, when NT [place], getting patient from Bed side commode back to bed she was not wanting to lift her right foot. MRI has been ordered from [facility] . Family in room and updated

## 2019-07-27 NOTE — Care Management Important Message (Signed)
Important Message  Patient Details  Name: Meghan Dougherty MRN: [id number] Date of Birth: Aug 12, 1940   Medicare Important Message Given:  Yes     [name] 07/27/2019, 11:09 AM

## 2019-07-28 MED ORDER — HYDROCORTISONE 1 % EX CREA: TOPICAL_CREAM | Freq: Three times a day (TID) | CUTANEOUS | 0 refills | Status: DC

## 2019-07-28 MED ORDER — OXYCODONE HCL 5 MG PO TABS: 5.0000 mg | ORAL_TABLET | ORAL | 0 refills | Status: DC | PRN

## 2019-07-28 MED ORDER — TRAMADOL HCL 50 MG PO TABS: 50.0000 mg | ORAL_TABLET | Freq: Four times a day (QID) | ORAL | 0 refills | Status: DC | PRN

## 2019-07-28 NOTE — Progress Notes (Signed)
Pt A&O, denied pain during assessment. Pt was able to move her BLE, polar care in use. During the shift, she wanted it removed but was educated on the importance of keeping it on. Pt was able to get some sleep during the night, refused to take any pain medication, but requested for benadry for itchyness. No complaints voiced.

## 2019-07-28 NOTE — Progress Notes (Signed)
Physical Therapy Treatment Patient Details Name: Meghan Dougherty MRN: [id number] DOB: Sep 28, 1940 Today's Date: 07/28/2019    History of Present Illness 79 y/o female s/p L TKA 4/15.  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    PT Comments    Pt was agreeable to PT this afternoon. Pt continued to have difficulty with active ROM for the L leg during supine exercises. Pt was able to ambulate a total of 50 feet during the session with constant cueing and encouragement for weight-bearing through the left and taking longer steps with the R. Pt had decreased safety awareness when ambulating and often tried to take another step with her L leg before bringing her R leg forward. Pt followed commands during ambulation but appeared to have very poor carryover given that she needed cueing with every step. Pt will benefit from PT services in a SNF setting upon discharge to safely address deficits listed in patient problem list for decreased caregiver assistance and eventual return to PLOF.    Follow Up Recommendations  SNF     Equipment Recommendations  None recommended by PT    Recommendations for Other Services       Precautions / Restrictions Precautions Precautions: Fall;Knee Restrictions Weight Bearing Restrictions: Yes LLE Weight Bearing: Weight bearing as tolerated    Mobility  Bed Mobility Overal bed mobility: Needs Assistance Bed Mobility: Supine to Sit     Supine to sit: Min assist     General bed mobility comments: Min A to help scoot to the front of the bed  Transfers Overall transfer level: Needs assistance Equipment used: Rolling walker (2 wheeled) Transfers: Sit to/from Stand Sit to Stand: Min assist         General transfer comment: Min A during both transfer attempts to keep upward momentum and prevent LOB  Ambulation/Gait Ambulation/Gait assistance: Min guard Gait Distance (Feet): 40 Feetx1, 10 Feet x1 Assistive device: Rolling walker (2  wheeled) Gait Pattern/deviations: Step-to pattern;Decreased stance time - left;Decreased step length - right;Trunk flexed Gait velocity: Very slow   General Gait Details: Pt required constant cueing and motivation to increase L stance time and inc R step length. Pt would often try to step with the L foot when the R foot was still trailing- constant cueing for pt to step with R.   Stairs             Wheelchair Mobility    Modified Rankin (Stroke Patients Only)       Balance Overall balance assessment: Needs assistance Sitting-balance support: No upper extremity supported;Feet supported Sitting balance-Leahy Scale: Good Sitting balance - Comments: able to sit steady at edge of bed   Standing balance support: Bilateral upper extremity supported Standing balance-Leahy Scale: Fair Standing balance comment: In static standing and with amb, pt used a mod asssit through the RW and had no instances of LOB. During sit-to-stand transfer, pt had a small LOB that required min A from author                            Cognition Arousal/Alertness: Awake/alert Behavior During Therapy: WFL for tasks assessed/performed Overall Cognitive Status: Within Functional Limits for tasks assessed                                 General Comments: Pt consistently followed commands      Exercises Total  Joint Exercises Ankle Circles/Pumps: AROM;Strengthening;Both;10 reps Quad Sets: Strengthening;Left;10 reps Straight Leg Raises: AAROM;Left;10 reps Long Arc Quad: AROM;Strengthening;Both;10 reps Knee Flexion: AROM;Left;Seated;10 reps Goniometric ROM: L knee: extension lacking 7 degrees, flexion 83 deg Other Exercises Other Exercises: Education on importance of WB through the LLE Other Exercises: Educated on WBS    General Comments        Pertinent Vitals/Pain Pain Assessment: 0-10 Pain Score: 4  Pain Location: left knee Pain Descriptors / Indicators:  Aching;Sore Pain Intervention(s): Monitored during session;Premedicated before session    Home Living                      Prior Function            PT Goals (current goals can now be found in the care plan section) Progress towards PT goals: Progressing toward goals    Frequency    BID      PT Plan Current plan remains appropriate    Co-evaluation              AM-PAC PT "6 Clicks" Mobility   Outcome Measure  Help needed turning from your back to your side while in a flat bed without using bedrails?: A Little Help needed moving from lying on your back to sitting on the side of a flat bed without using bedrails?: A Little Help needed moving to and from a bed to a chair (including a wheelchair)?: A Lot Help needed standing up from a chair using your arms (e.g., wheelchair or bedside chair)?: A Little Help needed to walk in hospital room?: A Little Help needed climbing 3-5 steps with a railing? : A Lot 6 Click Score: 16    End of Session Equipment Utilized During Treatment: Gait belt Activity Tolerance: Patient tolerated treatment well Patient left: in chair;with call bell/phone within reach;with chair alarm set;with family/visitor present;with SCD's reapplied;Other (comment)(Polar Care) Nurse Communication: Mobility status PT Visit Diagnosis: Muscle weakness (generalized) (M62.81);Difficulty in walking, not elsewhere classified (R26.2);Pain Pain - Right/Left: Left Pain - part of body: Knee     Time: [id number] PT Time Calculation (min) (ACUTE ONLY): 42 min  Charges:                        Annabelle Harman, SPT 07/28/19 12:10 PM

## 2019-07-28 NOTE — TOC Progression Note (Signed)
Transition of Care [facility]) - Progression Note    Patient Details  Name: Meghan Dougherty MRN: [id number] Date of Birth: [birth year]  Transition of Care [facility]) CM/SW Juana Diaz, RN Phone Number: 07/28/2019, 9:16 AM  Clinical Narrative:     Received a call from [facility] requesting new clinical, I faxed clinical to [phone] [id number]         Expected Discharge Plan and Services           Expected Discharge Date: 07/28/19                                     Social Determinants of Health (SDOH) Interventions    Readmission Risk Interventions No flowsheet data found.

## 2019-07-28 NOTE — Progress Notes (Signed)
Subjective: 5 Days Post-Op Procedure(s) (LRB): TOTAL KNEE ARTHROPLASTY (Left) Patient reports pain as mild.   Patient is well, Brain MRI negative for acute process. Plan is to go Rehab after hospital stay. Negative for chest pain and shortness of breath Fever: no Gastrointestinal: negative for nausea and vomiting.   Patient has had a bowel movement.  Objective: Vital signs in last 24 hours: Temp:  [98.1 F (36.7 C)-98.4 F (36.9 C)] 98.4 F (36.9 C) (04/19 2330) Pulse Rate:  [67-70] 67 (04/19 2330) Resp:  [16-18] 17 (04/19 2330) BP: (108-119)/(49-60) 108/56 (04/19 2330) SpO2:  [93 %-97 %] 93 % (04/19 2330)  Intake/Output from previous day:  Intake/Output Summary (Last 24 hours) at 07/28/2019 0726 Last data filed at 07/27/2019 1015 Gross per 24 hour  Intake 240 ml  Output --  Net 240 ml    Intake/Output this shift: No intake/output data recorded.  Labs: No results for input(s): HGB in the last 72 hours. No results for input(s): WBC, RBC, HCT, PLT in the last 72 hours. Recent Labs    07/26/19 0438  NA 131*  K 3.5  CL 94*  CO2 29  BUN 16  CREATININE 0.77  GLUCOSE 99  CALCIUM 8.6*   No results for input(s): LABPT, INR in the last 72 hours.  EXAM General - Patient is Appropriate and Oriented . Neuro: Patient can smile, grit teeth, poke out cheeks, stick tongue out and more it side to side. No facial asymetry or numbness. Extremity - Neurovascular intact Dorsiflexion/Plantar flexion intact Compartment soft  Is able to move BLE without issues this AM. Dressing/Incision -No drainage noted to the left knee. Motor Function - intact, moving foot and toes well on exam.  Cardiovascular- Regular rate and rhythm, no murmurs/rubs/gallops Respiratory- Lungs clear to auscultation bilaterally Gastrointestinal- soft, nontender and active bowel sounds  Assessment/Plan: 5 Days Post-Op Procedure(s) (LRB): TOTAL KNEE ARTHROPLASTY (Left) Active Problems:   Status post total  knee replacement using cement, left  Estimated body mass index is 29.77 kg/m as calculated from the following:   Height as of this encounter: 4' 11.5" (1.511 m).   Weight as of this encounter: 68 kg. Advance diet Up with therapy   PT concerned with patient not moving RLE yesterday.  Brain MRI was obtained, negative for acute procress. She is moving legs in bed without issue. Patient has had a BM. Discharge to SNF today.  DVT Prophylaxis - Lovenox, Ted hose and foot pumps Weight-Bearing as tolerated to left leg  J. Cameron Proud, PA-C [facility] Orthopaedic Surgery 07/28/2019, 7:26 AM

## 2019-07-28 NOTE — Progress Notes (Signed)
Physical Therapy Treatment Patient Details Name: Meghan Dougherty MRN: [id number] DOB: Jul 31, 1940 Today's Date: 07/28/2019    History of Present Illness 79 y/o female s/p L TKA 4/15.  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    PT Comments    Pt was agreeable to PT this afternoon. Pt required less assistance with bed mobility and transfers than this morning but still required a lot of cueing with gait. Pt was generally unsteady and often struggled to manage the RW and to take coordinated steps. Pt would sometimes take small steps but move the RW far out in front of her or would step outside of the RW with her R foot. Pt is unsafe to ambulate without assistance and is at a high risk for falling. Pt ambulated sixty feet twice but gait was extremely effortful and often unsafe. Pt will benefit from PT services in a SNF setting upon discharge to safely address deficits listed in patient problem list for decreased caregiver assistance and eventual return to PLOF.    Follow Up Recommendations  SNF     Equipment Recommendations  None recommended by PT    Recommendations for Other Services       Precautions / Restrictions Precautions Precautions: Fall;Knee Restrictions Weight Bearing Restrictions: Yes LLE Weight Bearing: Weight bearing as tolerated    Mobility  Bed Mobility Overal bed mobility: Needs Assistance Bed Mobility: Supine to Sit     Supine to sit: Supervision Sit to supine: Min guard      Transfers Overall transfer level: Needs assistance Equipment used: Rolling walker (2 wheeled) Transfers: Sit to/from Stand Sit to Stand: Min guard         General transfer comment: No physical assistance provided this afternoon  Ambulation/Gait Ambulation/Gait assistance: Min guard Gait Distance (Feet): 60 Feet(x2) Assistive device: Rolling walker (2 wheeled) Gait Pattern/deviations: Step-to pattern;Decreased stance time - left;Decreased step length - right;Trunk  flexed Gait velocity: Very slow   General Gait Details: Pt required cueing and motivation to increase L stance time and inc R step length. Pt would often try to step with the L foot when the R foot was still trailing- cueing for pt to step with R. Gait was slow and effortful. Pt had difficulty managing her RW and often got too far behind or to the right of the RW.   Stairs             Wheelchair Mobility    Modified Rankin (Stroke Patients Only)       Balance Overall balance assessment: Needs assistance Sitting-balance support: No upper extremity supported;Feet supported Sitting balance-Leahy Scale: Good Sitting balance - Comments: able to sit steady at edge of bed   Standing balance support: Bilateral upper extremity supported Standing balance-Leahy Scale: Fair Standing balance comment: In static standing and with amb, pt used a mod assist through the RW and had no instances of LOB but was generally unsteady and required cueing for proper RW use                            Cognition Arousal/Alertness: Awake/alert Behavior During Therapy: WFL for tasks assessed/performed Overall Cognitive Status: Within Functional Limits for tasks assessed                                        Exercises Total Joint Exercises Ankle  Circles/Pumps: AROM;Strengthening;Both;10 reps Quad Sets: Strengthening;Left;10 reps Other Exercises Other Exercises: Education on importance of WB through the LLE Other Exercises: Educated on WBS    General Comments        Pertinent Vitals/Pain Pain Score: 4  Pain Location: left knee Pain Descriptors / Indicators: Aching;Sore Pain Intervention(s): Monitored during session;Premedicated before session    Home Living                      Prior Function            PT Goals (current goals can now be found in the care plan section) Progress towards PT goals: Progressing toward goals    Frequency     BID      PT Plan Current plan remains appropriate    Co-evaluation              AM-PAC PT "6 Clicks" Mobility   Outcome Measure  Help needed turning from your back to your side while in a flat bed without using bedrails?: A Little Help needed moving from lying on your back to sitting on the side of a flat bed without using bedrails?: A Little Help needed moving to and from a bed to a chair (including a wheelchair)?: A Little Help needed standing up from a chair using your arms (e.g., wheelchair or bedside chair)?: A Little Help needed to walk in hospital room?: A Little Help needed climbing 3-5 steps with a railing? : A Lot 6 Click Score: 17    End of Session Equipment Utilized During Treatment: Gait belt Activity Tolerance: Patient tolerated treatment well Patient left: in bed;with call bell/phone within reach;with bed alarm set;with SCD's reapplied Nurse Communication: Mobility status PT Visit Diagnosis: Muscle weakness (generalized) (M62.81);Difficulty in walking, not elsewhere classified (R26.2);Pain Pain - Right/Left: Left     Time: [id number] PT Time Calculation (min) (ACUTE ONLY): 30 min  Charges:                        Annabelle Harman, SPT 07/28/19 4:41 PM

## 2019-07-29 NOTE — TOC Transition Note (Signed)
Transition of Care [facility]) - CM/SW Discharge Note   Patient Details  Name: Meghan Dougherty MRN: [id number] Date of Birth: 1940/05/17  [facility]) CM/SW Contact:  Su Hilt, RN Phone Number: 07/29/2019, 10:45 AM   Clinical Narrative:    RNCM called EMS to transport to [facility], The bedside nurse is aware.     Final next level of care: Skilled Nursing Facility Barriers to Discharge: Barriers Resolved   Patient Goals and CMS Choice Patient states their goals for this hospitalization and ongoing recovery are:: get better to go home      Discharge Placement              Patient chooses bed at: [facility] [place] Patient to be transferred to facility by: EMS Name of family member notified: [name] Patient and family notified of of transfer: 07/29/19  Discharge Plan and Services                                     Social Determinants of Health (SDOH) Interventions     Readmission Risk Interventions No flowsheet data found.

## 2019-07-29 NOTE — TOC Progression Note (Signed)
Transition of Care [facility]) - Progression Note    Patient Details  Name: Aylin Wallgren MRN: [id number] Date of Birth: [birth year]  [facility]) CM/SW Contact  Su Hilt, RN Phone Number: 07/29/2019, 8:55 AM  Clinical Narrative:    Received a call from [place] for SNF, Beginning 4/21 next review date 4/23 Dollene Cleveland the coordinator, Notified the physician and [facility]        Expected Discharge Plan and Services           Expected Discharge Date: 07/28/19                                     Social Determinants of Health (SDOH) Interventions    Readmission Risk Interventions No flowsheet data found.

## 2019-07-29 NOTE — Progress Notes (Signed)
Report called to Kim, LPN at [facility].   

## 2019-07-29 NOTE — Progress Notes (Signed)
Pt discharged this morning to [facility] via [facility].  NAD noted at d/c. VS WNL.  Honeycomb dressing in place to pt L knee no drainage noted. All belongings packed up into pts suitcase which was taken by son.  Polar care, glasses and cell phone left with pt.  Skin warm dry clean and intact with the exception of scattered BUE bruising.  No IV in place to remove.  Report was called to [facility] by Surveyor, quantity, Tiffiany C.

## 2019-07-29 NOTE — TOC Progression Note (Signed)
Transition of Care [facility]) - Progression Note    Patient Details  Name: Meghan Dougherty MRN: [id number] Date of Birth: 1940/07/18  Transition of Care [facility]) CM/SW [place], RN Phone Number: 07/29/2019, 9:17 AM  Clinical Narrative:     Damaris Schooner to [name] the Bedside nurse and let her know that the insurance auth is obtained and the DC packet is on the chart, I called the Son to notify him that the patient will be discharged today, I left a general VM for a call back with my contact information He called back and I notified him that she will be transported today via EMS, He will come to the hospital to pick up her belongings. I provided him with [facility] phone number to call with any questions.       Expected Discharge Plan and Services           Expected Discharge Date: 07/29/19                                     Social Determinants of Health (SDOH) Interventions    Readmission Risk Interventions No flowsheet data found.

## 2019-10-21 ENCOUNTER — Other Ambulatory Visit: Payer: Self-pay | Admitting: General Surgery

## 2019-10-21 DIAGNOSIS — Z853 Personal history of malignant neoplasm of breast: Secondary | ICD-10-CM

## 2019-11-26 ENCOUNTER — Ambulatory Visit
Admission: RE | Admit: 2019-11-26 | Discharge: 2019-11-26 | Disposition: A | Discharge status: Home / Self Care | Payer: Medicare Other | Source: Ambulatory Visit | Attending: General Surgery | Admitting: General Surgery

## 2019-11-26 ENCOUNTER — Other Ambulatory Visit: Payer: Self-pay

## 2019-11-26 DIAGNOSIS — Z853 Personal history of malignant neoplasm of breast: Secondary | ICD-10-CM | POA: Diagnosis not present

## 2020-01-01 NOTE — Progress Notes (Signed)
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  INTERVAL HISTORY: Patient returns to clinic today for routine yearly evaluation.  She had a CVA approximately 1 year ago and continues to have residual right-sided weakness, but otherwise feels well.  She has no new neurologic complaints.  She denies any recent fevers or illnesses. She has a good appetite and denies weight loss. She denies any pain.  She denies any chest pain, shortness of breath, cough, or hemoptysis.  She denies any nausea, vomiting, constipation, or diarrhea. She has no urinary complaints.  Patient offers no further specific complaints today.    REVIEW OF SYSTEMS:   Review of Systems  Constitutional: Negative.  Negative for fever, malaise/fatigue and weight loss.  Eyes: Negative.   Respiratory: Negative.  Negative for cough and shortness of breath.   Cardiovascular: Negative.  Negative for palpitations and leg swelling.  Gastrointestinal: Negative.  Negative for abdominal pain.  Genitourinary: Negative.  Negative for dysuria.  Musculoskeletal: Negative.  Negative for back pain.  Skin: Negative.  Negative for rash.  Neurological: Positive for focal weakness. Negative for weakness and headaches.  Psychiatric/Behavioral: Negative.  The patient is not nervous/anxious.     As per HPI. Otherwise, a complete review of systems is negative.  PAST MEDICAL HISTORY: Past Medical History:  Diagnosis Date  . Borderline diabetes   . Breast cancer, right ([place]) 11/2016   invasive mammary carcinoma, Lumpectomy and rad tx's.   . Colon polyp   . Diabetes mellitus without complication ([place])    type 2  . Hyperlipemia   . Hypertension    . Hyperthyroidism   . Hypothyroidism   . Insomnia   . Osteopenia   . Personal history of radiation therapy 2018   right breast cancer  . Pneumonia   . Stroke ([place]) 12/09/2018   right sided weakness, mild speech, unsteady gait    . Vertigo     PAST SURGICAL HISTORY: Past Surgical History:  Procedure Laterality Date  . ABDOMINAL HYSTERECTOMY     oophorectomy  . APPENDECTOMY    . BREAST BIOPSY Left    neg core  . BREAST BIOPSY Right    neg  core  . BREAST BIOPSY Right 11/2016   invasive mammary carcinoma, [place]  . BREAST BIOPSY Right 11/2016   benign, stero  . BREAST LUMPECTOMY Right 12/2016   invasive mammary carcinoma  Negative margins  . CATARACT EXTRACTION W/ INTRAOCULAR LENS IMPLANT    . CHOLECYSTECTOMY    . COLONOSCOPY    . COLONOSCOPY WITH PROPOFOL N/A 01/15/2018   Procedure: COLONOSCOPY WITH PROPOFOL;  Surgeon: Toledo, Benay Pike, MD;  Location: [facility] ENDOSCOPY;  Service: Gastroenterology;  Laterality: N/A;  . EYE SURGERY     bilateral cataracts  . OOPHORECTOMY    . PARTIAL MASTECTOMY WITH NEEDLE LOCALIZATION Right 01/04/2017   Procedure: PARTIAL MASTECTOMY WITH NEEDLE LOCALIZATION;  Surgeon: Leonie Green, MD;  Location: [facility] ORS;  Service: General;  Laterality: Right;  . SENTINEL NODE BIOPSY Right 01/04/2017   Procedure: SENTINEL NODE BIOPSY;  Surgeon: Leonie Green, MD;  Location: [facility] ORS;  Service: General;  Laterality: Right;  . THYROID SURGERY     total thyroidectomy  . TONSILLECTOMY    . TOTAL KNEE  ARTHROPLASTY Left 07/23/2019   Procedure: TOTAL KNEE ARTHROPLASTY;  Surgeon: Corky Mull, MD;  Location: [facility] ORS;  Service: Orthopedics;  Laterality: Left;  . TOTAL THYROIDECTOMY      FAMILY HISTORY: Family History  Problem Relation Age of Onset  . Breast cancer Sister 25  . Lung cancer Sister   . Diabetes Sister   . Breast cancer Maternal Aunt   . Diabetes Mother   . Diabetes Father   . Diabetes Brother   . Diabetes Sister   . Diabetes  Sister   . Diabetes Brother     ADVANCED DIRECTIVES (Y/N):  N  HEALTH MAINTENANCE: Social History   Tobacco Use  . Smoking status: Never Smoker  . Smokeless tobacco: Never Used  Vaping Use  . Vaping Use: Never used  Substance Use Topics  . Alcohol use: Yes    Comment: occassional  . Drug use: No     Colonoscopy:  PAP:  Bone density:  Lipid panel:  Allergies  Allergen Reactions  . Levaquin [Levofloxacin] Rash  . Penicillins Rash    Has patient had a PCN reaction causing immediate rash, facial/tongue/throat swelling, SOB or lightheadedness with hypotension: No Has patient had a PCN reaction causing severe rash involving mucus membranes or skin necrosis: No Has patient had a PCN reaction that required hospitalization: No Has patient had a PCN reaction occurring within the last 10 years: No If all of the above answers are "NO", then may proceed with Cephalosporin use.   . Trazodone Other (See Comments)    Headache and nightmares    Current Outpatient Medications  Medication Sig Dispense Refill  . amLODipine (NORVASC) 5 MG tablet Take 5 mg by mouth daily.    Marland Kitchen atorvastatin (LIPITOR) 40 MG tablet Take 1 tablet (40 mg total) by mouth daily at 6 PM.    . calcium-vitamin D (OSCAL WITH D) 500-200 MG-UNIT tablet Take 2 tablets by mouth daily.    Marland Kitchen enoxaparin (LOVENOX) 40 MG/0.4ML injection Inject 0.4 mLs (40 mg total) into the skin daily. 5.6 mL 0  . hydrochlorothiazide (HYDRODIURIL) 25 MG tablet Take 25 mg by mouth daily.    . hydrocortisone cream 1 % Apply topically 3 (three) times daily. 30 g 0  . levothyroxine (SYNTHROID) 75 MCG tablet Take 75 mcg by mouth every Monday, Tuesday, Wednesday, Thursday, and Friday.    . levothyroxine (SYNTHROID, LEVOTHROID) 50 MCG tablet Take 50 mcg by mouth See admin instructions. Take 50 mcg by mouth on Saturday and Sunday    . lisinopril (ZESTRIL) 40 MG tablet Take 40 mg by mouth daily.    . metoprolol tartrate (LOPRESSOR) 100 MG tablet Take  100 mg by mouth 2 (two) times daily.    . Multiple Vitamin (MULTIVITAMIN WITH MINERALS) TABS tablet Take 2 tablets by mouth daily.    Marland Kitchen omeprazole (PRILOSEC) 20 MG capsule Take 20 mg by mouth daily as needed (heartburn).    Marland Kitchen oxyCODONE (OXY IR/ROXICODONE) 5 MG immediate release tablet Take 1 tablet (5 mg total) by mouth every 4 (four) hours as needed for breakthrough pain. 42 tablet 0  . PARoxetine (PAXIL) 10 MG tablet Take 10 mg by mouth at bedtime.    . traMADol (ULTRAM) 50 MG tablet Take 1-2 tablets (50-100 mg total) by mouth every 6 (six) hours as needed for moderate pain. 40 tablet 0  . zolpidem (AMBIEN) 5 MG tablet Take 2.5 mg by mouth at bedtime as needed for sleep.     No current facility-administered  medications for this visit.    OBJECTIVE: Vitals:   01/07/20 1400  BP: 132/66  Pulse: (!) 56  Resp: 16  Temp: 99.2 F (37.3 C)  SpO2: 93%     Body mass index is 29.29 kg/m.    ECOG FS:0 - Asymptomatic  General: Well-developed, well-nourished, no acute distress. Eyes: Pink conjunctiva, anicteric sclera. HEENT: Normocephalic, moist mucous membranes. Breast: Patient reports a normal exam by another provider. Lungs: No audible wheezing or coughing. Heart: Regular rate and rhythm. Abdomen: Soft, nontender, no obvious distention. Musculoskeletal: No edema, cyanosis, or clubbing. Neuro: Alert, answering all questions appropriately. Cranial nerves grossly intact. Skin: No rashes or petechiae noted. Psych: Normal affect.  LAB RESULTS:  Lab Results  Component Value Date   NA 131 (L) 07/26/2019   K 3.5 07/26/2019   CL 94 (L) 07/26/2019   CO2 29 07/26/2019   GLUCOSE 99 07/26/2019   BUN 16 07/26/2019   CREATININE 0.77 07/26/2019   CALCIUM 8.6 (L) 07/26/2019   PROT 7.1 07/14/2019   ALBUMIN 4.2 07/14/2019   AST 25 07/14/2019   ALT 20 07/14/2019   ALKPHOS 71 07/14/2019   BILITOT 0.8 07/14/2019   GFRNONAA >60 07/26/2019   GFRAA >60 07/26/2019    Lab Results  Component  Value Date   WBC 9.4 07/25/2019   NEUTROABS 3.9 07/14/2019   HGB 11.8 (L) 07/25/2019   HCT 34.0 (L) 07/25/2019   MCV 91.6 07/25/2019   PLT 205 07/25/2019     STUDIES: No results found.  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  PLAN:    1. 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 Although she has triple negative disease, given the small size of tumor of 6 mm and her advanced age, it was agreed upon that adjuvant chemotherapy would not be necessary. Plus, patient had stated she would decline chemotherapy anyway.  She completed adjuvant XRT in January 2019. Given the ER/PR negativity of her malignancy, an aromatase inhibitor would not offer any benefit.  Her most recent mammogram on November 26, 2019 was reported as BI-RADS 2.  Repeat in August 2022.  Patient continues to request less frequent follow-up, therefore return to clinic in 1 year for routine evaluation.    I spent a total of 20 minutes reviewing chart data, face-to-face evaluation with the patient, counseling and coordination of care as detailed above.  Patient expressed understanding and was in agreement with this plan. She also understands that She can call clinic at any time with any questions, concerns, or complaints.    Cancer Staging Primary cancer of upper inner quadrant of right female breast [facility]) Staging form: Breast, AJCC 8th Edition - Clinical stage from 01/02/2017: Stage IB (cT1b, cN0, cM0, G3, ER: Negative, PR: Negative, HER2: Negative) - Signed by Lloyd Huger, MD on 01/21/2017 - Pathologic stage from 01/23/2017: Stage IB (pT1b, pN0, cM0, G3, ER: Negative, PR: Negative, HER2: Negative) - Signed by Lloyd Huger, MD on 01/23/2017   Lloyd Huger, MD   01/07/2020 3:23 PM

## 2020-01-06 ENCOUNTER — Encounter: Payer: Self-pay | Admitting: Oncology

## 2020-01-06 NOTE — Progress Notes (Signed)
Patient called for pre assessment. States had a stroke last year and since then she has had some trouble chewing and swallowing. She denies any pain. States she had knee surgery recently and is still recovering. Denies any questions or concerns for provider.

## 2020-01-07 ENCOUNTER — Encounter: Payer: Self-pay | Admitting: Oncology

## 2020-01-07 ENCOUNTER — Other Ambulatory Visit: Payer: Self-pay

## 2020-01-07 ENCOUNTER — Inpatient Hospital Stay: Payer: Medicare Other | Attending: Oncology | Admitting: Oncology

## 2020-01-07 VITALS — BP 132/66 | HR 56 | Temp 99.2°F | Resp 16 | Ht 59.5 in | Wt 147.5 lb

## 2020-01-07 DIAGNOSIS — Z923 Personal history of irradiation: Secondary | ICD-10-CM | POA: Insufficient documentation

## 2020-01-07 DIAGNOSIS — Z79811 Long term (current) use of aromatase inhibitors: Secondary | ICD-10-CM | POA: Insufficient documentation

## 2020-01-07 DIAGNOSIS — Z171 Estrogen receptor negative status [ER-]: Secondary | ICD-10-CM | POA: Insufficient documentation

## 2020-01-07 DIAGNOSIS — C50211 Malignant neoplasm of upper-inner quadrant of right female breast: Secondary | ICD-10-CM | POA: Insufficient documentation

## 2020-04-18 ENCOUNTER — Telehealth: Payer: Self-pay | Admitting: Infectious Diseases

## 2020-04-18 NOTE — Telephone Encounter (Signed)
Tim I had seen this patient for right-sided hip and leg pain.  Did an x-ray which showed a lucency in the femur and a bone scan is recommended. I wanted to touch base about what kind of bone scan to order given her hx of malignancy.  Is it a whole body NM bone scan or limited or something different.Thanks Waunita Schooner

## 2020-04-18 NOTE — Telephone Encounter (Signed)
No NM med bone scan is what you want.  Do you mind getting a ca27.29 as well?  Thanks!

## 2020-04-18 NOTE — Telephone Encounter (Signed)
Ok thanks 

## 2020-04-19 ENCOUNTER — Other Ambulatory Visit: Payer: Self-pay | Admitting: Infectious Diseases

## 2020-04-19 DIAGNOSIS — M899 Disorder of bone, unspecified: Secondary | ICD-10-CM

## 2020-04-19 DIAGNOSIS — C50211 Malignant neoplasm of upper-inner quadrant of right female breast: Secondary | ICD-10-CM

## 2020-04-28 ENCOUNTER — Other Ambulatory Visit: Payer: Self-pay

## 2020-04-28 ENCOUNTER — Encounter
Admission: RE | Admit: 2020-04-28 | Discharge: 2020-04-28 | Disposition: A | Discharge status: Home / Self Care | Payer: Medicare Other | Source: Ambulatory Visit | Attending: Infectious Diseases | Admitting: Infectious Diseases

## 2020-04-28 DIAGNOSIS — C50211 Malignant neoplasm of upper-inner quadrant of right female breast: Secondary | ICD-10-CM | POA: Insufficient documentation

## 2020-04-28 DIAGNOSIS — M899 Disorder of bone, unspecified: Secondary | ICD-10-CM | POA: Insufficient documentation

## 2020-04-28 MED ORDER — TECHNETIUM TC 99M MEDRONATE IV KIT
20.0000 | PACK | Freq: Once | INTRAVENOUS | Status: AC | PRN
  Administered 2020-04-28: 19.469 via INTRAVENOUS

## 2020-04-29 ENCOUNTER — Telehealth: Payer: Self-pay | Admitting: *Deleted

## 2020-04-29 NOTE — Telephone Encounter (Signed)
Entered in error

## 2020-05-02 ENCOUNTER — Telehealth: Payer: Self-pay | Admitting: Infectious Diseases

## 2020-05-02 NOTE — Telephone Encounter (Signed)
PET scan done and negative at the femur spot we were interested in. However some activity in spine. CA27-29 nml at 22

## 2020-05-02 NOTE — Telephone Encounter (Signed)
Ok thanks.  Sure does not appear to be related to recurrent malignancy.

## 2020-05-06 NOTE — Progress Notes (Deleted)
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  INTERVAL HISTORY: Patient returns to clinic today for routine yearly evaluation.  She had a CVA approximately 1 year ago and continues to have residual right-sided weakness, but otherwise feels well.  She has no new neurologic complaints.  She denies any recent fevers or illnesses. She has a good appetite and denies weight loss. She denies any pain.  She denies any chest pain, shortness of breath, cough, or hemoptysis.  She denies any nausea, vomiting, constipation, or diarrhea. She has no urinary complaints.  Patient offers no further specific complaints today.    REVIEW OF SYSTEMS:   Review of Systems  Constitutional: Negative.  Negative for fever, malaise/fatigue and weight loss.  Eyes: Negative.   Respiratory: Negative.  Negative for cough and shortness of breath.   Cardiovascular: Negative.  Negative for palpitations and leg swelling.  Gastrointestinal: Negative.  Negative for abdominal pain.  Genitourinary: Negative.  Negative for dysuria.  Musculoskeletal: Negative.  Negative for back pain.  Skin: Negative.  Negative for rash.  Neurological: Positive for focal weakness. Negative for weakness and headaches.  Psychiatric/Behavioral: Negative.  The patient is not nervous/anxious.     As per HPI. Otherwise, a complete review of systems is negative.  PAST MEDICAL HISTORY: Past Medical History:  Diagnosis Date  . Borderline diabetes   . Breast cancer, right ([place]) 11/2016   invasive mammary carcinoma, Lumpectomy and rad tx's.   . Colon polyp   . Diabetes mellitus without complication ([place])    type 2  . Hyperlipemia   . Hypertension    . Hyperthyroidism   . Hypothyroidism   . Insomnia   . Osteopenia   . Personal history of radiation therapy 2018   right breast cancer  . Pneumonia   . Stroke ([place]) 12/09/2018   right sided weakness, mild speech, unsteady gait    . Vertigo     PAST SURGICAL HISTORY: Past Surgical History:  Procedure Laterality Date  . ABDOMINAL HYSTERECTOMY     oophorectomy  . APPENDECTOMY    . BREAST BIOPSY Left    neg core  . BREAST BIOPSY Right    neg  core  . BREAST BIOPSY Right 11/2016   invasive mammary carcinoma, [place]  . BREAST BIOPSY Right 11/2016   benign, stero  . BREAST LUMPECTOMY Right 12/2016   invasive mammary carcinoma  Negative margins  . CATARACT EXTRACTION W/ INTRAOCULAR LENS IMPLANT    . CHOLECYSTECTOMY    . COLONOSCOPY    . COLONOSCOPY WITH PROPOFOL N/A 01/15/2018   Procedure: COLONOSCOPY WITH PROPOFOL;  Surgeon: Toledo, Benay Pike, MD;  Location: [facility]COPY;  Service: Gastroenterology;  Laterality: N/A;  . EYE SURGERY     bilateral cataracts  . OOPHORECTOMY    . PARTIAL MASTECTOMY WITH NEEDLE LOCALIZATION Right 01/04/2017   Procedure: PARTIAL MASTECTOMY WITH NEEDLE LOCALIZATION;  Surgeon: Leonie Green, MD;  Location: [facility] ORS;  Service: General;  Laterality: Right;  . SENTINEL NODE BIOPSY Right 01/04/2017   Procedure: SENTINEL NODE BIOPSY;  Surgeon: Leonie Green, MD;  Location: [facility] ORS;  Service: General;  Laterality: Right;  . THYROID SURGERY     total thyroidectomy  . TONSILLECTOMY    . TOTAL KNEE  ARTHROPLASTY Left 07/23/2019   Procedure: TOTAL KNEE ARTHROPLASTY;  Surgeon: Corky Mull, MD;  Location: [facility] ORS;  Service: Orthopedics;  Laterality: Left;  . TOTAL THYROIDECTOMY      FAMILY HISTORY: Family History  Problem Relation Age of Onset  . Breast cancer Sister 18  . Lung cancer Sister   . Diabetes Sister   . Breast cancer Maternal Aunt   . Diabetes Mother   . Diabetes Father   . Diabetes Brother   . Diabetes Sister   . Diabetes  Sister   . Diabetes Brother     ADVANCED DIRECTIVES (Y/N):  N  HEALTH MAINTENANCE: Social History   Tobacco Use  . Smoking status: Never Smoker  . Smokeless tobacco: Never Used  Vaping Use  . Vaping Use: Never used  Substance Use Topics  . Alcohol use: Yes    Comment: occassional  . Drug use: No     Colonoscopy:  PAP:  Bone density:  Lipid panel:  Allergies  Allergen Reactions  . Levaquin [Levofloxacin] Rash  . Penicillins Rash    Has patient had a PCN reaction causing immediate rash, facial/tongue/throat swelling, SOB or lightheadedness with hypotension: No Has patient had a PCN reaction causing severe rash involving mucus membranes or skin necrosis: No Has patient had a PCN reaction that required hospitalization: No Has patient had a PCN reaction occurring within the last 10 years: No If all of the above answers are "NO", then may proceed with Cephalosporin use.   . Trazodone Other (See Comments)    Headache and nightmares    Current Outpatient Medications  Medication Sig Dispense Refill  . amLODipine (NORVASC) 5 MG tablet Take 5 mg by mouth daily.    Marland Kitchen atorvastatin (LIPITOR) 40 MG tablet Take 1 tablet (40 mg total) by mouth daily at 6 PM.    . calcium-vitamin D (OSCAL WITH D) 500-200 MG-UNIT tablet Take 2 tablets by mouth daily.    Marland Kitchen enoxaparin (LOVENOX) 40 MG/0.4ML injection Inject 0.4 mLs (40 mg total) into the skin daily. 5.6 mL 0  . hydrochlorothiazide (HYDRODIURIL) 25 MG tablet Take 25 mg by mouth daily.    . hydrocortisone cream 1 % Apply topically 3 (three) times daily. 30 g 0  . levothyroxine (SYNTHROID) 75 MCG tablet Take 75 mcg by mouth every Monday, Tuesday, Wednesday, Thursday, and Friday.    . levothyroxine (SYNTHROID, LEVOTHROID) 50 MCG tablet Take 50 mcg by mouth See admin instructions. Take 50 mcg by mouth on Saturday and Sunday    . lisinopril (ZESTRIL) 40 MG tablet Take 40 mg by mouth daily.    . metoprolol tartrate (LOPRESSOR) 100 MG tablet Take  100 mg by mouth 2 (two) times daily.    . Multiple Vitamin (MULTIVITAMIN WITH MINERALS) TABS tablet Take 2 tablets by mouth daily.    Marland Kitchen omeprazole (PRILOSEC) 20 MG capsule Take 20 mg by mouth daily as needed (heartburn).    Marland Kitchen oxyCODONE (OXY IR/ROXICODONE) 5 MG immediate release tablet Take 1 tablet (5 mg total) by mouth every 4 (four) hours as needed for breakthrough pain. 42 tablet 0  . PARoxetine (PAXIL) 10 MG tablet Take 10 mg by mouth at bedtime.    . traMADol (ULTRAM) 50 MG tablet Take 1-2 tablets (50-100 mg total) by mouth every 6 (six) hours as needed for moderate pain. 40 tablet 0  . zolpidem (AMBIEN) 5 MG tablet Take 2.5 mg by mouth at bedtime as needed for sleep.     No current facility-administered  medications for this visit.    OBJECTIVE: There were no vitals filed for this visit.   There is no height or weight on file to calculate BMI.    ECOG FS:0 - Asymptomatic  General: Well-developed, well-nourished, no acute distress. Eyes: Pink conjunctiva, anicteric sclera. HEENT: Normocephalic, moist mucous membranes. Breast: Patient reports a normal exam by another provider. Lungs: No audible wheezing or coughing. Heart: Regular rate and rhythm. Abdomen: Soft, nontender, no obvious distention. Musculoskeletal: No edema, cyanosis, or clubbing. Neuro: Alert, answering all questions appropriately. Cranial nerves grossly intact. Skin: No rashes or petechiae noted. Psych: Normal affect.  LAB RESULTS:  Lab Results  Component Value Date   NA 131 (L) 07/26/2019   K 3.5 07/26/2019   CL 94 (L) 07/26/2019   CO2 29 07/26/2019   GLUCOSE 99 07/26/2019   BUN 16 07/26/2019   CREATININE 0.77 07/26/2019   CALCIUM 8.6 (L) 07/26/2019   PROT 7.1 07/14/2019   ALBUMIN 4.2 07/14/2019   AST 25 07/14/2019   ALT 20 07/14/2019   ALKPHOS 71 07/14/2019   BILITOT 0.8 07/14/2019   GFRNONAA >60 07/26/2019   GFRAA >60 07/26/2019    Lab Results  Component Value Date   WBC 9.4 07/25/2019    NEUTROABS 3.9 07/14/2019   HGB 11.8 (L) 07/25/2019   HCT 34.0 (L) 07/25/2019   MCV 91.6 07/25/2019   PLT 205 07/25/2019     STUDIES: NM Bone Scan Whole Body  Result Date: 04/29/2020 CLINICAL DATA:  RIGHT breast cancer, lytic lesion of LEFT femur, prior LEFT knee arthroplasty, diabetes mellitus EXAM: NUCLEAR MEDICINE WHOLE BODY BONE SCAN TECHNIQUE: Whole body anterior and posterior images were obtained approximately 3 hours after intravenous injection of radiopharmaceutical. RADIOPHARMACEUTICALS:  19.469 mCi Technetium-70mMDP IV COMPARISON:  None Radiographic correlation: LEFT knee radiographs 07/23/2019 FINDINGS: Uptake surrounding components of a LEFT knee prosthesis consistent with relative recent age of surgery in April 2021. Abnormal tracer uptake identified at the lumbar spine at L1-L2 and L4-L5, less at superior endplate L3, could be degenerative but metastatic disease not excluded. Uptake at shoulders, sternoclavicular joints, hands, and hips typically degenerative. No additional sites of worrisome tracer uptake are seen. Expected urinary tract and soft tissue distribution of tracer. IMPRESSION: Uptake at LEFT knee prosthesis, felt to be related to relative recent age of surgery. Nonspecific tracer uptake identified in the lumbar spine at L1-L2 and L4-L5, less at superior endplate of L3, potentially degenerative though metastatic disease not excluded; recommend radiographic correlation. No additional worrisome scintigraphic abnormalities. Specifically, no abnormal tracer uptake or definite photopenic areas are seen within the LEFT femur. Electronically Signed   By: MLavonia DanaM.D.   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  PLAN:    1. 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 Although she  has triple negative disease, given the small size of tumor of 6 mm and her advanced age, it was agreed upon that adjuvant chemotherapy would not be necessary. Plus, patient had stated she would decline chemotherapy anyway.  She completed adjuvant XRT in January 2019. Given the ER/PR negativity of her malignancy, an aromatase inhibitor would not offer any benefit.  Her most recent mammogram on November 26, 2019 was reported as BI-RADS 2.  Repeat in August 2022.  Patient continues to request less frequent follow-up, therefore return  to clinic in 1 year for routine evaluation.    I spent a total of 20 minutes reviewing chart data, face-to-face evaluation with the patient, counseling and coordination of care as detailed above.  Patient expressed understanding and was in agreement with this plan. She also understands that She can call clinic at any time with any questions, concerns, or complaints.    Cancer Staging Primary cancer of upper inner quadrant of right female breast [facility]) Staging form: Breast, AJCC 8th Edition - Clinical stage from 01/02/2017: Stage IB (cT1b, cN0, cM0, G3, ER: Negative, PR: Negative, HER2: Negative) - Signed by Lloyd Huger, MD on 01/21/2017 Neoadjuvant therapy: No Histologic grading system: 3 grade system Laterality: Right - Pathologic stage from 01/23/2017: Stage IB (pT1b, pN0, cM0, G3, ER: Negative, PR: Negative, HER2: Negative) - Signed by Lloyd Huger, MD on 01/23/2017 Neoadjuvant therapy: No Histologic grading system: 3 grade system Laterality: Right   Lloyd Huger, MD   05/06/2020 6:43 AM

## 2020-05-09 ENCOUNTER — Telehealth: Payer: Self-pay | Admitting: Oncology

## 2020-05-09 NOTE — Telephone Encounter (Signed)
Pt called to report direct COVID exposure. R/S appt on 05/10/20 to 05/24/20. Pt will call if any changes.

## 2020-05-10 ENCOUNTER — Inpatient Hospital Stay: Payer: Medicare Other | Admitting: Oncology

## 2020-05-10 DIAGNOSIS — C50211 Malignant neoplasm of upper-inner quadrant of right female breast: Secondary | ICD-10-CM

## 2020-05-21 NOTE — Progress Notes (Signed)
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  INTERVAL HISTORY: Patient returns to clinic today for routine yearly evaluation.  She currently feels well and is at her baseline.  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  She has no other neurologic complaints.  She denies any recent fevers or illnesses. She has a good appetite and denies weight loss. She denies any pain.  She denies any chest pain, shortness of breath, cough, or hemoptysis.  She denies any nausea, vomiting, constipation, or diarrhea. She has no urinary complaints.  Patient offers no further specific complaints today.  REVIEW OF SYSTEMS:   Review of Systems  Constitutional: Negative.  Negative for fever, malaise/fatigue and weight loss.  Eyes: Negative.   Respiratory: Negative.  Negative for cough and shortness of breath.   Cardiovascular: Negative.  Negative for palpitations and leg swelling.  Gastrointestinal: Negative.  Negative for abdominal pain.  Genitourinary: Negative.  Negative for dysuria.  Musculoskeletal: Negative.  Negative for back pain.  Skin: Negative.  Negative for rash.  Neurological: Positive for focal weakness. Negative for weakness and headaches.  Psychiatric/Behavioral: Negative.  The patient is not nervous/anxious.     As per HPI. Otherwise, a complete review of systems is negative.  PAST MEDICAL HISTORY: Past Medical History:  Diagnosis Date  . Borderline diabetes   . Breast cancer, right ([place]) 11/2016   invasive mammary carcinoma, Lumpectomy and rad tx's.   . Colon polyp   . Diabetes mellitus without complication ([place])    type 2  . Hyperlipemia   . Hypertension   .  Hyperthyroidism   . Hypothyroidism   . Insomnia   . Osteopenia   . Personal history of radiation therapy 2018   right breast cancer  . Pneumonia   . Stroke ([place]) 12/09/2018   right sided weakness, mild speech, unsteady gait    . Vertigo     PAST SURGICAL HISTORY: Past Surgical History:  Procedure Laterality Date  . ABDOMINAL HYSTERECTOMY     oophorectomy  . APPENDECTOMY    . BREAST BIOPSY Left    neg core  . BREAST BIOPSY Right    neg  core  . BREAST BIOPSY Right 11/2016   invasive mammary carcinoma, [place]  . BREAST BIOPSY Right 11/2016   benign, stero  . BREAST LUMPECTOMY Right 12/2016   invasive mammary carcinoma  Negative margins  . CATARACT EXTRACTION W/ INTRAOCULAR LENS IMPLANT    . CHOLECYSTECTOMY    . COLONOSCOPY    . COLONOSCOPY WITH PROPOFOL N/A 01/15/2018   Procedure: COLONOSCOPY WITH PROPOFOL;  Surgeon: Toledo, Benay Pike, MD;  Location: [facility];  Service: Gastroenterology;  Laterality: N/A;  . EYE SURGERY     bilateral cataracts  . OOPHORECTOMY    . PARTIAL MASTECTOMY WITH NEEDLE LOCALIZATION Right 01/04/2017   Procedure: PARTIAL MASTECTOMY WITH NEEDLE LOCALIZATION;  Surgeon: Leonie Green, MD;  Location: [facility] OR[facility];  Service: General;  Laterality: Right;  . SENTINEL NODE BIOPSY Right 01/04/2017   Procedure: SENTINEL NODE BIOPSY;  Surgeon: Leonie Green, MD;  Location: [facility] ORS;  Service: General;  Laterality: Right;  . THYROID SURGERY     total thyroidectomy  . TONSILLECTOMY    . TOTAL KNEE ARTHROPLASTY  Left 07/23/2019   Procedure: TOTAL KNEE ARTHROPLASTY;  Surgeon: Corky Mull, MD;  Location: [facility] OR[facility];  Service: Orthopedics;  Laterality: Left;  . TOTAL THYROIDECTOMY      FAMILY HISTORY: Family History  Problem Relation Age of Onset  . Breast cancer Sister 3  . Lung cancer Sister   . Diabetes Sister   . Breast cancer Maternal Aunt   . Diabetes Mother   . Diabetes Father   . Diabetes Brother   . Diabetes Sister   . Diabetes Sister    . Diabetes Brother     ADVANCED DIRECTIVES (Y/N):  N  HEALTH MAINTENANCE: Social History   Tobacco Use  . Smoking status: Never Smoker  . Smokeless tobacco: Never Used  Vaping Use  . Vaping Use: Never used  Substance Use Topics  . Alcohol use: Yes    Comment: occassional  . Drug use: No     Colonoscopy:  PAP:  Bone density:  Lipid panel:  Allergies  Allergen Reactions  . Levaquin [Levofloxacin] Rash  . Penicillins Rash    Has patient had a PCN reaction causing immediate rash, facial/tongue/throat swelling, SOB or lightheadedness with hypotension: No Has patient had a PCN reaction causing severe rash involving mucus membranes or skin necrosis: No Has patient had a PCN reaction that required hospitalization: No Has patient had a PCN reaction occurring within the last 10 years: No If all of the above answers are "NO", then may proceed with Cephalosporin use.   . Trazodone Other (See Comments)    Headache and nightmares    Current Outpatient Medications  Medication Sig Dispense Refill  . amLODipine (NORVASC) 10 MG tablet Take by mouth.    Marland Kitchen atorvastatin (LIPITOR) 40 MG tablet Take 1 tablet (40 mg total) by mouth daily at 6 PM.    . calcium-vitamin D (OSCAL WITH D) 500-200 MG-UNIT tablet Take 2 tablets by mouth daily.    . celecoxib (CELEBREX) 200 MG capsule Take 200 mg by mouth daily.    Marland Kitchen levothyroxine (SYNTHROID) 75 MCG tablet Take 75 mcg by mouth daily before breakfast.    . lisinopril (ZESTRIL) 40 MG tablet Take 40 mg by mouth daily.    Marland Kitchen lisinopril (ZESTRIL) 40 MG tablet Take 1 tablet by mouth daily.    . metoprolol tartrate (LOPRESSOR) 100 MG tablet Take 100 mg by mouth 2 (two) times daily.    . metoprolol tartrate (LOPRESSOR) 100 MG tablet Take 1 tablet by mouth 2 (two) times daily.    . Multiple Vitamin (MULTIVITAMIN WITH MINERALS) TABS tablet Take 2 tablets by mouth daily.    Marland Kitchen omeprazole (PRILOSEC) 20 MG capsule Take 20 mg by mouth daily as needed  (heartburn).    Marland Kitchen PARoxetine (PAXIL) 10 MG tablet Take 10 mg by mouth at bedtime.    Marland Kitchen zolpidem (AMBIEN) 5 MG tablet Take 2.5 mg by mouth at bedtime as needed for sleep.     No current facility-administered medications for this visit.    OBJECTIVE: Vitals:   05/24/20 1319  BP: (!) 146/70  Pulse: 63  Resp: 18  Temp: 98.4 F (36.9 C)     Body mass index is 29.35 kg/m.    ECOG FS:0 - Asymptomatic  General: Well-developed, well-nourished, no acute distress. Eyes: Pink conjunctiva, anicteric sclera. HEENT: Normocephalic, moist mucous membranes. Breast: Exam deferred today. Lungs: No audible wheezing or coughing. Heart: Regular rate and rhythm. Abdomen: Soft, nontender, no obvious distention. Musculoskeletal: No edema, cyanosis, or clubbing. Neuro: Alert, answering  all questions appropriately. Cranial nerves grossly intact. Skin: No rashes or petechiae noted. Psych: Normal affect.  LAB RESULTS:  Lab Results  Component Value Date   NA 131 (L) 07/26/2019   K 3.5 07/26/2019   CL 94 (L) 07/26/2019   CO2 29 07/26/2019   GLUCOSE 99 07/26/2019   BUN 16 07/26/2019   CREATININE 0.77 07/26/2019   CALCIUM 8.6 (L) 07/26/2019   PROT 7.1 07/14/2019   ALBUMIN 4.2 07/14/2019   AST 25 07/14/2019   ALT 20 07/14/2019   ALKPHOS 71 07/14/2019   BILITOT 0.8 07/14/2019   GFRNONAA >60 07/26/2019   GFRAA >60 07/26/2019    Lab Results  Component Value Date   WBC 9.4 07/25/2019   NEUTROABS 3.9 07/14/2019   HGB 11.8 (L) 07/25/2019   HCT 34.0 (L) 07/25/2019   MCV 91.6 07/25/2019   PLT 205 07/25/2019     STUDIES: NM Bone Scan Whole Body  Result Date: 04/29/2020 CLINICAL DATA:  RIGHT breast cancer, lytic lesion of LEFT femur, prior LEFT knee arthroplasty, diabetes mellitus EXAM: NUCLEAR MEDICINE WHOLE BODY BONE SCAN TECHNIQUE: Whole body anterior and posterior images were obtained approximately 3 hours after intravenous injection of radiopharmaceutical. RADIOPHARMACEUTICALS:  19.469 mCi  Technetium-64mMDP IV COMPARISON:  None Radiographic correlation: LEFT knee radiographs 07/23/2019 FINDINGS: Uptake surrounding components of a LEFT knee prosthesis consistent with relative recent age of surgery in April 2021. Abnormal tracer uptake identified at the lumbar spine at L1-L2 and L4-L5, less at superior endplate L3, could be degenerative but metastatic disease not excluded. Uptake at shoulders, sternoclavicular joints, hands, and hips typically degenerative. No additional sites of worrisome tracer uptake are seen. Expected urinary tract and soft tissue distribution of tracer. IMPRESSION: Uptake at LEFT knee prosthesis, felt to be related to relative recent age of surgery. Nonspecific tracer uptake identified in the lumbar spine at L1-L2 and L4-L5, less at superior endplate of L3, potentially degenerative though metastatic disease not excluded; recommend radiographic correlation. No additional worrisome scintigraphic abnormalities. Specifically, no abnormal tracer uptake or definite photopenic areas are seen within the LEFT femur. Electronically Signed   By: MLavonia DanaM.D.   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  PLAN:    1. 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 Although she has triple negative disease, given the small size of tumor of 6 mm and her advanced age, it was agreed upon that adjuvant chemotherapy would not be necessary. Plus, patient had stated she would decline chemotherapy anyway.  She completed adjuvant XRT in January 2019. Given the ER/PR negativity of her malignancy, an aromatase inhibitor would not offer any benefit.  Her most recent mammogram on November 26, 2019 was reported as BI-RADS 2.  Repeat in August 2022.  Recent nuclear med bone scan on April 29, 2020 reviewed  independently in her orders above with no obvious bony abnormality suspicious for metastatic disease.  CA 27-29 is also within normal limits.  Return to clinic in 6 months after her next mammogram for routine evaluation at which point patient can be switched back to yearly visits.   I spent a total of 20 minutes reviewing chart data, face-to-face evaluation with the patient, counseling and coordination of care as detailed above.  Patient expressed understanding and was in agreement with this plan. She also understands that  She can call clinic at any time with any questions, concerns, or complaints.    Cancer Staging Primary cancer of upper inner quadrant of right female breast [facility]) Staging form: Breast, AJCC 8th Edition - Clinical stage from 01/02/2017: Stage IB (cT1b, cN0, cM0, G3, ER: Negative, PR: Negative, HER2: Negative) - Signed by Lloyd Huger, MD on 01/21/2017 Neoadjuvant therapy: No Histologic grading system: 3 grade system Laterality: Right - Pathologic stage from 01/23/2017: Stage IB (pT1b, pN0, cM0, G3, ER: Negative, PR: Negative, HER2: Negative) - Signed by Lloyd Huger, MD on 01/23/2017 Neoadjuvant therapy: No Histologic grading system: 3 grade system Laterality: Right   Lloyd Huger, MD   05/25/2020 6:20 AM

## 2020-05-24 ENCOUNTER — Encounter: Payer: Self-pay | Admitting: Oncology

## 2020-05-24 ENCOUNTER — Inpatient Hospital Stay: Payer: Medicare Other | Attending: Oncology | Admitting: Oncology

## 2020-05-24 VITALS — BP 146/70 | HR 63 | Temp 98.4°F | Resp 18 | Wt 147.8 lb

## 2020-05-24 DIAGNOSIS — Z171 Estrogen receptor negative status [ER-]: Secondary | ICD-10-CM | POA: Diagnosis not present

## 2020-05-24 DIAGNOSIS — C50211 Malignant neoplasm of upper-inner quadrant of right female breast: Secondary | ICD-10-CM | POA: Insufficient documentation

## 2020-05-24 NOTE — Progress Notes (Signed)
Pt in for follow up and scan results.  

## 2020-11-28 ENCOUNTER — Ambulatory Visit
Admission: RE | Admit: 2020-11-28 | Discharge: 2020-11-28 | Disposition: A | Discharge status: Home / Self Care | Payer: Medicare Other | Source: Ambulatory Visit | Attending: Oncology | Admitting: Oncology

## 2020-11-28 ENCOUNTER — Other Ambulatory Visit: Payer: Self-pay

## 2020-11-28 DIAGNOSIS — C50211 Malignant neoplasm of upper-inner quadrant of right female breast: Secondary | ICD-10-CM | POA: Diagnosis present

## 2020-11-28 DIAGNOSIS — Z1231 Encounter for screening mammogram for malignant neoplasm of breast: Secondary | ICD-10-CM | POA: Insufficient documentation

## 2020-12-02 ENCOUNTER — Encounter: Payer: Self-pay | Admitting: Oncology

## 2020-12-02 ENCOUNTER — Inpatient Hospital Stay: Payer: Medicare Other | Attending: Oncology

## 2020-12-02 ENCOUNTER — Inpatient Hospital Stay (HOSPITAL_BASED_OUTPATIENT_CLINIC_OR_DEPARTMENT_OTHER): Payer: Medicare Other | Admitting: Oncology

## 2020-12-02 ENCOUNTER — Other Ambulatory Visit: Payer: Self-pay

## 2020-12-02 VITALS — BP 145/61 | HR 57 | Temp 97.8°F | Resp 16 | Wt 144.8 lb

## 2020-12-02 DIAGNOSIS — C50211 Malignant neoplasm of upper-inner quadrant of right female breast: Secondary | ICD-10-CM

## 2020-12-02 DIAGNOSIS — Z853 Personal history of malignant neoplasm of breast: Secondary | ICD-10-CM | POA: Diagnosis not present

## 2020-12-02 NOTE — Progress Notes (Signed)
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  INTERVAL HISTORY: Meghan Dougherty is an 80 year old female with past medical history significant for TIA with residual right-sided weakness, left knee replacement and triple negative breast cancer who is followed by Dr. Grayland Ormond.  Meghan Dougherty is status post lumpectomy and XRT but did not require chemotherapy secondary to size of tumor and patient's wishes.  Today Meghan Dougherty reports doing well.  Meghan Dougherty remains active despite her stroke and knee replacement surgery.  Reports some limited mobility in left knee.  Meghan Dougherty lives at home alone and continues to drive herself.  Meghan Dougherty uses a cane for ambulation while out in public but does not need it at home.  Meghan Dougherty is eating and drinking well.  Meghan Dougherty recently had a mammogram but was not told her results.  REVIEW OF SYSTEMS:   Review of Systems  Constitutional:  Positive for malaise/fatigue. Negative for chills, fever and weight loss.  HENT:  Negative for congestion, ear pain and tinnitus.   Eyes: Negative.  Negative for blurred vision and double vision.  Respiratory: Negative.  Negative for cough, sputum production and shortness of breath.   Cardiovascular: Negative.  Negative for chest pain, palpitations and leg swelling.  Gastrointestinal: Negative.  Negative for abdominal pain, constipation, diarrhea, nausea and vomiting.  Genitourinary:  Negative for dysuria, frequency and urgency.  Musculoskeletal:  Positive for joint pain (Left knee). Negative for back pain and falls.  Skin: Negative.  Negative for rash.  Neurological:  Positive for weakness. Negative for headaches.  Endo/Heme/Allergies: Negative.  Does not bruise/bleed easily.   Psychiatric/Behavioral: Negative.  Negative for depression. The patient is not nervous/anxious and does not have insomnia.    As per HPI. Otherwise, a complete review of systems is negative.  PAST MEDICAL HISTORY: Past Medical History:  Diagnosis Date   Borderline diabetes    Breast cancer, right ([place]) 11/2016   invasive mammary carcinoma, Lumpectomy and rad tx's.    Colon polyp    Diabetes mellitus without complication ([place])    type 2   Hyperlipemia    Hypertension    Hyperthyroidism    Hypothyroidism    Insomnia    Osteopenia    Personal history of radiation therapy 2018   right breast cancer   Pneumonia    Stroke ([place]) 12/09/2018   right sided weakness, mild speech, unsteady gait     Vertigo     PAST SURGICAL HISTORY: Past Surgical History:  Procedure Laterality Date   ABDOMINAL HYSTERECTOMY     oophorectomy   APPENDECTOMY     BREAST BIOPSY Left    neg core   BREAST BIOPSY Right    neg  core   BREAST BIOPSY Right 11/2016   invasive mammary carcinoma, US   BREAST BIOPSY Right 11/2016   benign, stero   BREAST LUMPECTOMY Right 12/2016   invasive mammary carcinoma  Negative margins   CATARACT EXTRACTION W/ INTRAOCULAR LENS IMPLANT     CHOLECYSTECTOMY     COLONOSCOPY     COLONOSCOPY WITH PROPOFOL N/A 01/15/2018   Procedure: COLONOSCOPY WITH PROPOFOL;  Surgeon: Toledo, Benay Pike, MD;  Location: [facility] ENDOSCOPY;  Service: Gastroenterology;  Laterality: N/A;   EYE SURGERY     bilateral cataracts   OOPHORECTOMY  PARTIAL MASTECTOMY WITH NEEDLE LOCALIZATION Right 01/04/2017   Procedure: PARTIAL MASTECTOMY WITH NEEDLE LOCALIZATION;  Surgeon: Leonie Green, MD;  Location: [facility] ORS;  Service: General;  Laterality: Right;   SENTINEL NODE BIOPSY Right 01/04/2017   Procedure: SENTINEL NODE BIOPSY;  Surgeon: Leonie Green, MD;  Location: [facility] ORS;  Service: General;  Laterality: Right;   THYROID SURGERY     total thyroidectomy   TONSILLECTOMY     TOTAL KNEE  ARTHROPLASTY Left 07/23/2019   Procedure: TOTAL KNEE ARTHROPLASTY;  Surgeon: Corky Mull, MD;  Location: [facility] ORS;  Service: Orthopedics;  Laterality: Left;   TOTAL THYROIDECTOMY      FAMILY HISTORY: Family History  Problem Relation Age of Onset   Breast cancer Sister 31   Lung cancer Sister    Diabetes Sister    Breast cancer Maternal Aunt    Diabetes Mother    Diabetes Father    Diabetes Brother    Diabetes Sister    Diabetes Sister    Diabetes Brother     ADVANCED DIRECTIVES (Y/N):  N  HEALTH MAINTENANCE: Social History   Tobacco Use   Smoking status: Never   Smokeless tobacco: Never  Vaping Use   Vaping Use: Never used  Substance Use Topics   Alcohol use: Yes    Comment: occassional   Drug use: No     Colonoscopy:  PAP:  Bone density:  Lipid panel:  Allergies  Allergen Reactions   Levaquin [Levofloxacin] Rash   Penicillins Rash    Has patient had a PCN reaction causing immediate rash, facial/tongue/throat swelling, SOB or lightheadedness with hypotension: No Has patient had a PCN reaction causing severe rash involving mucus membranes or skin necrosis: No Has patient had a PCN reaction that required hospitalization: No Has patient had a PCN reaction occurring within the last 10 years: No If all of the above answers are "NO", then may proceed with Cephalosporin use.    Trazodone Other (See Comments)    Headache and nightmares    Current Outpatient Medications  Medication Sig Dispense Refill   amLODipine (NORVASC) 10 MG tablet Take by mouth.     aspirin 325 MG tablet Take 325 mg by mouth daily.     atorvastatin (LIPITOR) 40 MG tablet Take 1 tablet (40 mg total) by mouth daily at 6 PM.     calcium-vitamin D (OSCAL WITH D) 500-200 MG-UNIT tablet Take 2 tablets by mouth daily.     hydrochlorothiazide (HYDRODIURIL) 25 MG tablet Take 25 mg by mouth daily.     levothyroxine (SYNTHROID) 75 MCG tablet Take 75 mcg by mouth daily before breakfast.     lisinopril  (ZESTRIL) 40 MG tablet Take 1 tablet by mouth daily.     metoprolol tartrate (LOPRESSOR) 100 MG tablet Take 100 mg by mouth 2 (two) times daily.     Multiple Vitamin (MULTIVITAMIN WITH MINERALS) TABS tablet Take 2 tablets by mouth daily.     omeprazole (PRILOSEC) 20 MG capsule Take 20 mg by mouth daily as needed (heartburn).     pantoprazole (PROTONIX) 40 MG tablet Take 1 tablet by mouth daily.     PARoxetine (PAXIL) 10 MG tablet Take 10 mg by mouth at bedtime.     zolpidem (AMBIEN) 5 MG tablet Take 2.5 mg by mouth at bedtime as needed for sleep.     aspirin 81 MG EC tablet Take by mouth. (Patient not taking: Reported on 12/02/2020)     celecoxib (CELEBREX) 200 MG capsule  Take 200 mg by mouth daily. (Patient not taking: Reported on 12/02/2020)     hydrOXYzine (ATARAX/VISTARIL) 25 MG tablet Take 25 mg by mouth 3 (three) times daily as needed. (Patient not taking: Reported on 12/02/2020)     ibuprofen (ADVIL) 200 MG tablet Take by mouth. (Patient not taking: Reported on 12/02/2020)     No current facility-administered medications for this visit.    OBJECTIVE: Vitals:   12/02/20 1057  BP: (!) 145/61  Pulse: (!) 57  Resp: 16  Temp: 97.8 F (36.6 C)  SpO2: 96%     Body mass index is 28.76 kg/m.    ECOG FS:0 - Asymptomatic  Physical Exam Constitutional:      Appearance: Normal appearance.  HENT:     Head: Normocephalic and atraumatic.  Eyes:     Pupils: Pupils are equal, round, and reactive to light.  Cardiovascular:     Rate and Rhythm: Normal rate and regular rhythm.     Heart sounds: Normal heart sounds. No murmur heard. Pulmonary:     Effort: Pulmonary effort is normal.     Breath sounds: Normal breath sounds. No wheezing.  Abdominal:     General: Bowel sounds are normal. There is no distension.     Palpations: Abdomen is soft.     Tenderness: There is no abdominal tenderness.  Musculoskeletal:        General: Normal range of motion.     Cervical back: Normal range of motion.   Skin:    General: Skin is warm and dry.     Findings: No rash.  Neurological:     Mental Status: Meghan Dougherty is alert and oriented to person, place, and time.  Psychiatric:        Judgment: Judgment normal.      LAB RESULTS:  Lab Results  Component Value Date   NA 131 (L) 07/26/2019   K 3.5 07/26/2019   CL 94 (L) 07/26/2019   CO2 29 07/26/2019   GLUCOSE 99 07/26/2019   BUN 16 07/26/2019   CREATININE 0.77 07/26/2019   CALCIUM 8.6 (L) 07/26/2019   PROT 7.1 07/14/2019   ALBUMIN 4.2 07/14/2019   AST 25 07/14/2019   ALT 20 07/14/2019   ALKPHOS 71 07/14/2019   BILITOT 0.8 07/14/2019   GFRNONAA >60 07/26/2019   GFRAA >60 07/26/2019    Lab Results  Component Value Date   WBC 9.4 07/25/2019   NEUTROABS 3.9 07/14/2019   HGB 11.8 (L) 07/25/2019   HCT 34.0 (L) 07/25/2019   MCV 91.6 07/25/2019   PLT 205 07/25/2019     STUDIES: MM 3D SCREEN BREAST BILATERAL  Result Date: 11/29/2020 CLINICAL DATA:  Screening. EXAM: DIGITAL SCREENING BILATERAL MAMMOGRAM WITH TOMOSYNTHESIS AND CAD TECHNIQUE: Bilateral screening digital craniocaudal and mediolateral oblique mammograms were obtained. Bilateral screening digital breast tomosynthesis was performed. The images were evaluated with computer-aided detection. COMPARISON:  Previous exam(s). ACR Breast Density Category c: The breast tissue is heterogeneously dense, which may obscure small masses. FINDINGS: There are no findings suspicious for malignancy. IMPRESSION: No mammographic evidence of malignancy. A result letter of this screening mammogram will be mailed directly to the patient. RECOMMENDATION: Screening mammogram in one year. (Code:SM-B-01Y) BI-RADS CATEGORY  1: Negative. Electronically Signed   By: Kristopher Oppenheim M.D.   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  PLAN:    1. Pathologic stage IB triple negative invasive carcinoma of the  upper inner  quadrant of the right breast-  Meghan Dougherty is status postlumpectomy on 01/04/2017.  Given her advanced age, tumor size and patient's wishes Meghan Dougherty did not require adjuvant chemo.  Meghan Dougherty completed XRT in January 2019.  Most recent mammogram was from 11/28/2020 which showed no evidence of malignancy BI-RADS Category 1 negative. Her CA 27-29 has been normal.  Return to clinic in 12 months for follow-up after mammogram.  2.Hip pain/Femur pain- Initially thought to be d/t possibly metastatic disease but imaging from 04/29/2020 did not have any obvious bony abnormality suspicious for metastatic disease.  Meghan Dougherty is previously treated with prednisone and tramadol but pain was persistent.  Meghan Dougherty states now it waxes and wanes but is tolerable. Uses a cane.  No further follow-up is scheduled.  3.  Left knee replacement- Meghan Dougherty still has some limited mobility in her left knee.  Meghan Dougherty is followed by orthopedics.  I did discuss the care program with her in detail and Meghan Dougherty is to let us know if Meghan Dougherty is interested.  I spent 25 minutes dedicated to the care of this patient (face-to-face and non-face-to-face) on the date of the encounter to include what is described in the assessment and plan.  Patient expressed understanding and was in agreement with this plan. Meghan Dougherty also understands that Meghan Dougherty can call clinic at any time with any questions, concerns, or complaints.    Cancer Staging Primary cancer of upper inner quadrant of right female breast [facility]) Staging form: Breast, AJCC 8th Edition - Clinical stage from 01/02/2017: Stage IB (cT1b, cN0, cM0, G3, ER-, PR-, HER2-) - Signed by Lloyd Huger, MD on 01/21/2017 Neoadjuvant therapy: No Histologic grading system: 3 grade system Laterality: Right - Pathologic stage from 01/23/2017: Stage IB (pT1b, pN0, cM0, G3, ER-, PR-, HER2-) - Signed by Lloyd Huger, MD on 01/23/2017 Neoadjuvant therapy: No Histologic grading system: 3 grade system Laterality: Right   Jacquelin Hawking, NP    12/02/2020 11:11 AM

## 2020-12-03 LAB — CANCER ANTIGEN 27.29: CA 27.29: 19.8 U/mL (ref 0.0–38.6)

## 2021-01-05 ENCOUNTER — Ambulatory Visit: Payer: Medicare Other | Admitting: Oncology

## 2021-05-27 ENCOUNTER — Emergency Department: Payer: Medicare Other

## 2021-05-27 ENCOUNTER — Emergency Department
Admission: EM | Admit: 2021-05-27 | Discharge: 2021-05-27 | Disposition: A | Discharge status: Home / Self Care | Payer: Medicare Other | Attending: Emergency Medicine | Admitting: Emergency Medicine

## 2021-05-27 ENCOUNTER — Other Ambulatory Visit: Payer: Self-pay

## 2021-05-27 DIAGNOSIS — M1909 Primary osteoarthritis, other specified site: Secondary | ICD-10-CM | POA: Diagnosis not present

## 2021-05-27 DIAGNOSIS — S5001XA Contusion of right elbow, initial encounter: Secondary | ICD-10-CM | POA: Diagnosis not present

## 2021-05-27 DIAGNOSIS — M25521 Pain in right elbow: Secondary | ICD-10-CM | POA: Diagnosis not present

## 2021-05-27 DIAGNOSIS — M542 Cervicalgia: Secondary | ICD-10-CM | POA: Diagnosis not present

## 2021-05-27 DIAGNOSIS — W108XXA Fall (on) (from) other stairs and steps, initial encounter: Secondary | ICD-10-CM | POA: Insufficient documentation

## 2021-05-27 DIAGNOSIS — S0990XA Unspecified injury of head, initial encounter: Secondary | ICD-10-CM | POA: Diagnosis present

## 2021-05-27 DIAGNOSIS — W19XXXA Unspecified fall, initial encounter: Secondary | ICD-10-CM

## 2021-05-27 DIAGNOSIS — S7001XA Contusion of right hip, initial encounter: Secondary | ICD-10-CM | POA: Diagnosis not present

## 2021-05-27 DIAGNOSIS — Y92009 Unspecified place in unspecified non-institutional (private) residence as the place of occurrence of the external cause: Secondary | ICD-10-CM | POA: Insufficient documentation

## 2021-05-27 DIAGNOSIS — S0003XA Contusion of scalp, initial encounter: Secondary | ICD-10-CM | POA: Insufficient documentation

## 2021-05-27 MED ORDER — METHOCARBAMOL 500 MG PO TABS
500.0000 mg | ORAL_TABLET | Freq: Four times a day (QID) | ORAL | 0 refills | Status: DC
Start: 2021-05-27 — End: 2021-05-27

## 2021-05-27 MED ORDER — METHOCARBAMOL 500 MG PO TABS: 500.0000 mg | ORAL_TABLET | Freq: Four times a day (QID) | ORAL | 0 refills | Status: DC

## 2021-05-27 MED ORDER — PREDNISONE 50 MG PO TABS: 50.0000 mg | ORAL_TABLET | Freq: Every day | ORAL | 0 refills | Status: DC

## 2021-05-27 NOTE — ED Provider Notes (Signed)
[facility] Provider Note  Patient Contact: 4:55 PM (approximate)   History   Fall   HPI  Meghan Dougherty is a 81 y.o. female who presents the emergency department complaining of a mechanical fall.  Patient was attempting to go up the stairs from her garage into her house when she tripped and fell.  She fell sideways landing first on her right hip, then right elbow then hitting her head.  No loss of consciousness.  Patient denies any headache, visual changes at this time.  She does have some mild neck and elbow pain.  She denies any pain to the right hip and is ambulatory at this time.  No medications prior to arrival.  No other complaints.  Patient does affirm that this was a mechanical fall after she tripped.     Physical Exam   Triage Vital Signs: ED Triage Vitals [05/27/21 1439]  Enc Vitals Group     BP (!) 153/72     Pulse Rate 68     Resp 16     Temp 97.8 F (36.6 C)     Temp Source Oral     SpO2 94 %     Weight 145 lb (65.8 kg)     Height 4\' 11"  (1.499 m)     Head Circumference      Peak Flow      Pain Score 5     Pain Loc      Pain Edu?      Excl. in [place]?     Most recent vital signs: Vitals:   05/27/21 1439  BP: (!) 153/72  Pulse: 68  Resp: 16  Temp: 97.8 F (36.6 C)  SpO2: 94%     General: Alert and in no acute distress. Eyes:  PERRL. EOMI. Head: No acute traumatic findings  Neck: No stridor. No cervical spine tenderness to palpation.  Cardiovascular:  Good peripheral perfusion Respiratory: Normal respiratory effort without tachypnea or retractions. Lungs CTAB.  Musculoskeletal: Full range of motion to all extremities.  Visualization of the right elbow reveals no lacerations, rashes, ecchymosis.  Patient is able to flex and extend the elbow at this time.  Slight tenderness over the posterior elbow without palpable abnormality.  Good range of motion to both lower extremities, specifically the right.  There is no shortening  or rotation of the right leg.  Nontender to palpation.  Dorsalis pedis pulse and sensation intact ankle bilateral lower extremities. Neurologic:  No gross focal neurologic deficits are appreciated.  Cranial nerves II through XII grossly intact. Skin:   No rash noted Other:   ED Results / Procedures / Treatments   Labs (all labs ordered are listed, but only abnormal results are displayed) Labs Reviewed - No data to display   EKG     RADIOLOGY  I personally viewed and evaluated these images as part of my medical decision making, as well as reviewing the written report by the radiologist.  ED Provider Interpretation: No acute finding on CT scan of the head or neck.  Patient has no fractures on the elbow or hip x-ray.  DG Elbow Complete Right  Result Date: 05/27/2021 CLINICAL DATA:  Fall, elbow pain EXAM: RIGHT ELBOW - COMPLETE 3+ VIEW COMPARISON:  None. FINDINGS: There is no evidence of fracture, dislocation, or joint effusion. There is no evidence of arthropathy or other focal bone abnormality. Soft tissues are unremarkable. IMPRESSION: No fracture or dislocation of the right elbow. No elbow joint effusion  to suggest radiographically occult fracture. Electronically Signed   By: Delanna Ahmadi M.D.   On: 05/27/2021 18:11   CT HEAD WO CONTRAST (5MM)  Result Date: 05/27/2021 CLINICAL DATA:  Head trauma, minor (Age >= 65y); Neck trauma (Age >= 65y) head injury EXAM: CT HEAD WITHOUT CONTRAST CT CERVICAL SPINE WITHOUT CONTRAST TECHNIQUE: Multidetector CT imaging of the head and cervical spine was performed following the standard protocol without intravenous contrast. Multiplanar CT image reconstructions of the cervical spine were also generated. RADIATION DOSE REDUCTION: This exam was performed according to the departmental dose-optimization program which includes automated exposure control, adjustment of the mA and/or kV according to patient size and/or use of iterative reconstruction  technique. COMPARISON:  CT head 12/10/2018. MRI April 1921. FINDINGS: CT HEAD FINDINGS Brain: Remote infarct in the left basal ganglia, parent on prior MRI. Moderate patchy white matter hypoattenuation in the left greater than right white matter, similar to prior and nonspecific but compatible with chronic microvascular ischemic disease. No evidence of acute large vascular territory infarct, acute hemorrhage, mass lesion, midline shift, or hydrocephalus. Vascular: Calcific intracranial atherosclerosis. No hyperdense vessel identified. Skull: High right posterior scalp contusion. No evidence of acute fracture. Remodeling of the posterior calvarium in multiple spots along the posterior fossa, compatible with benign arachnoid granulations. Sinuses/Orbits: Visualized sinuses are clear.  Unremarkable orbits. Other: No mastoid effusions. CT CERVICAL SPINE FINDINGS Alignment: Mild reversal the normal cervical lordosis. No substantial sagittal subluxation. Broad levocurvature. Skull base and vertebrae: Vertebral body heights are maintained. No evidence of acute fracture. Osteopenia. Soft tissues and spinal canal: No prevertebral fluid or swelling. No visible canal hematoma. Disc levels: Moderate multilevel degenerative disc disease. Multilevel facet uncovertebral hypertrophy with varying degrees of neural foraminal stenosis. Upper chest: Linear scarring in the right upper lobe. Otherwise, visualized lung apices are clear motion limited assessment. IMPRESSION: CT head: 1. No evidence of acute intracranial abnormality. 2. High right posterior scalp contusion without acute fracture. 3. 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 CT cervical spine: 1. No evidence of acute fracture or traumatic malalignment. 2. Multilevel degenerative change. Electronically Signed   By: Margaretha Sheffield M.D.   On: 05/27/2021 15:36   CT Cervical Spine Wo Contrast  Result Date: 05/27/2021 CLINICAL DATA:  Head  trauma, minor (Age >= 65y); Neck trauma (Age >= 65y) head injury EXAM: CT HEAD WITHOUT CONTRAST CT CERVICAL SPINE WITHOUT CONTRAST TECHNIQUE: Multidetector CT imaging of the head and cervical spine was performed following the standard protocol without intravenous contrast. Multiplanar CT image reconstructions of the cervical spine were also generated. RADIATION DOSE REDUCTION: This exam was performed according to the departmental dose-optimization program which includes automated exposure control, adjustment of the mA and/or kV according to patient size and/or use of iterative reconstruction technique. COMPARISON:  CT head 12/10/2018. MRI April 1921. FINDINGS: CT HEAD FINDINGS Brain: Remote infarct in the left basal ganglia, parent on prior MRI. Moderate patchy white matter hypoattenuation in the left greater than right white matter, similar to prior and nonspecific but compatible with chronic microvascular ischemic disease. No evidence of acute large vascular territory infarct, acute hemorrhage, mass lesion, midline shift, or hydrocephalus. Vascular: Calcific intracranial atherosclerosis. No hyperdense vessel identified. Skull: High right posterior scalp contusion. No evidence of acute fracture. Remodeling of the posterior calvarium in multiple spots along the posterior fossa, compatible with benign arachnoid granulations. Sinuses/Orbits: Visualized sinuses are clear.  Unremarkable orbits. Other: No mastoid effusions. CT CERVICAL SPINE FINDINGS Alignment: Mild reversal the normal cervical  lordosis. No substantial sagittal subluxation. Broad levocurvature. Skull base and vertebrae: Vertebral body heights are maintained. No evidence of acute fracture. Osteopenia. Soft tissues and spinal canal: No prevertebral fluid or swelling. No visible canal hematoma. Disc levels: Moderate multilevel degenerative disc disease. Multilevel facet uncovertebral hypertrophy with varying degrees of neural foraminal stenosis. Upper  chest: Linear scarring in the right upper lobe. Otherwise, visualized lung apices are clear motion limited assessment. IMPRESSION: CT head: 1. No evidence of acute intracranial abnormality. 2. High right posterior scalp contusion without acute fracture. 3. 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 CT cervical spine: 1. No evidence of acute fracture or traumatic malalignment. 2. Multilevel degenerative change. Electronically Signed   By: Margaretha Sheffield M.D.   On: 05/27/2021 15:36   DG Hip Unilat W or Wo Pelvis 2-3 Views Right  Result Date: 05/27/2021 CLINICAL DATA:  Mechanical fall at home. EXAM: DG HIP (WITH OR WITHOUT PELVIS) 2-3V RIGHT COMPARISON:  None. FINDINGS: There is no evidence of hip fracture or dislocation. Advanced degenerative disc disease at the visualized lower lumbar levels. 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 IMPRESSION: No acute osseous abnormality. Electronically Signed   By: Ileana Roup M.D.   On: 05/27/2021 18:13    PROCEDURES:  Critical Care performed: No  Procedures   MEDICATIONS ORDERED IN ED: Medications - No data to display   IMPRESSION / MDM / [name] / ED COURSE  I reviewed the triage vital signs and the nursing notes.                              Differential diagnosis includes, but is not limited to, skull fracture, intracranial hemorrhage, cervical spine fracture, elbow fracture, hip fracture   Patient's diagnosis is consistent with fall, contusions of the elbow and hip.  Patient presented to the emergency department after mechanical fall.  She tripped going up the stairs into her house.  Patient fell landing primarily on her hip, then her elbow then striking her head.  Neurologically intact.  Imaging at this time is reassuring with negative CT scans of the head and neck as well as negative x-rays of the elbow and hip.  At this time patient is stable for discharge.  She will have short course of  prednisone and a muscle relaxer for symptom relief.  Follow-up primary care as needed. Patient is given ED precautions to return to the ED for any worsening or new symptoms.        FINAL CLINICAL IMPRESSION(S) / ED DIAGNOSES   Final diagnoses:  Fall, initial encounter  Minor head injury, initial encounter  Contusion of right elbow, initial encounter  Contusion of right hip, initial encounter     Rx / DC Orders   ED Discharge Orders          Ordered    predniSONE (DELTASONE) 50 MG tablet  Daily with breakfast,   Status:  Discontinued        05/27/21 1838    methocarbamol (ROBAXIN) 500 MG tablet  4 times daily,   Status:  Discontinued        05/27/21 1838    methocarbamol (ROBAXIN) 500 MG tablet  4 times daily        05/27/21 1925    predniSONE (DELTASONE) 50 MG tablet  Daily with breakfast        05/27/21 1925  Note:  This document was prepared using Dragon voice recognition software and may include unintentional dictation errors.   Brynda Peon 05/27/21 1937    Rada Hay, MD 05/28/21 1530

## 2021-05-27 NOTE — ED Notes (Signed)
Pt seen by provider. Pt in room with son who has helped her to bathroom 2 times.

## 2021-05-27 NOTE — ED Triage Notes (Signed)
Pt via EMS from home. Pt had a mechanical fall. Pt did hit her head. Denies blood thinners. Denies LOC. Takes baby ASA daily. Pt has a hematoma to the L posterior aspect of the head. Pt is A&Ox4 and NAD.   143/71 98% 69 HR  CBG 147

## 2021-05-27 NOTE — ED Notes (Signed)
Pt discharge information reviewed. Pt understands need for follow up care and when to return if symptoms worsen. All questions answered. Pt is alert and oriented with even and regular respirations. Pt is brought out of department in wheelchair with family.  ?

## 2021-10-03 ENCOUNTER — Other Ambulatory Visit: Payer: Self-pay | Admitting: General Surgery

## 2021-10-03 DIAGNOSIS — Z1231 Encounter for screening mammogram for malignant neoplasm of breast: Secondary | ICD-10-CM

## 2021-11-03 ENCOUNTER — Ambulatory Visit: Payer: Self-pay | Admitting: Urology

## 2021-11-15 ENCOUNTER — Encounter: Payer: Self-pay | Admitting: Urology

## 2021-11-15 ENCOUNTER — Ambulatory Visit: Payer: Medicare Other | Admitting: Urology

## 2021-11-15 VITALS — BP 152/72 | HR 60 | Ht 59.0 in | Wt 149.0 lb

## 2021-11-15 DIAGNOSIS — R350 Frequency of micturition: Secondary | ICD-10-CM

## 2021-11-15 DIAGNOSIS — N3281 Overactive bladder: Secondary | ICD-10-CM

## 2021-11-15 LAB — URINALYSIS, COMPLETE
Bilirubin, UA: NEGATIVE
Glucose, UA: NEGATIVE
Ketones, UA: NEGATIVE
Leukocytes,UA: NEGATIVE
Nitrite, UA: NEGATIVE
Protein,UA: NEGATIVE
RBC, UA: NEGATIVE
Specific Gravity, UA: 1.015 (ref 1.005–1.030)
Urobilinogen, Ur: 0.2 mg/dL (ref 0.2–1.0)
pH, UA: 5.5 (ref 5.0–7.5)

## 2021-11-15 LAB — MICROSCOPIC EXAMINATION: Bacteria, UA: NONE SEEN

## 2021-11-15 LAB — BLADDER SCAN AMB NON-IMAGING: Scan Result: 0

## 2021-11-15 MED ORDER — GEMTESA 75 MG PO TABS: 75.0000 mg | ORAL_TABLET | Freq: Every day | ORAL | 0 refills | Status: DC

## 2021-11-15 NOTE — Progress Notes (Signed)
11/15/2021 1:45 PM   Meghan Dougherty Apr 13, 1940 [id number]  Referring provider: Dion Body, MD [place] [facility] [place],  [place] [address]  Chief Complaint  Patient presents with   Urinary Frequency    HPI: Meghan Dougherty is a 81 y.o. female referred for evaluation of lower urinary tract symptoms  6 month history of bothersome lower urinary tract symptoms including urinary frequency, urgency and rare episodes of urinary incontinence When she goes to bed she typically will get up 3-4 times and is unable to fall asleep.  She finally falls asleep about 3 AM and will sleep uninterrupted until the morning She states her lack of sleep resulted in chronic tiredness Denies dysuria, gross hematuria or recurrent UTI She is widowed and no history of snoring or sleep apnea No previous treatment of her urinary symptoms  Prior hysterectomy No bowel symptoms   PMH: Past Medical History:  Diagnosis Date   Borderline diabetes    Breast cancer, right ([place]) 11/2016   invasive mammary carcinoma, Lumpectomy and rad tx's.    Colon polyp    Diabetes mellitus without complication ([place])    type 2   Hyperlipemia    Hypertension    Hyperthyroidism    Hypothyroidism    Insomnia    Osteopenia    Personal history of radiation therapy 2018   right breast cancer   Pneumonia    Stroke ([place]) 12/09/2018   right sided weakness, mild speech, unsteady gait     Vertigo     Surgical History: Past Surgical History:  Procedure Laterality Date   ABDOMINAL HYSTERECTOMY     oophorectomy   APPENDECTOMY     BREAST BIOPSY Left    neg core   BREAST BIOPSY Right    neg  core   BREAST BIOPSY Right 11/2016   invasive mammary carcinoma, US   BREAST BIOPSY Right 11/2016   benign, stero   BREAST LUMPECTOMY Right 12/2016   invasive mammary carcinoma  Negative margins   CATARACT EXTRACTION W/ INTRAOCULAR LENS IMPLANT     CHOLECYSTECTOMY     COLONOSCOPY      COLONOSCOPY WITH PROPOFOL N/A 01/15/2018   Procedure: COLONOSCOPY WITH PROPOFOL;  Surgeon: Toledo, Benay Pike, MD;  Location: [facility] ENDOSCOPY;  Service: Gastroenterology;  Laterality: N/A;   EYE SURGERY     bilateral cataracts   OOPHORECTOMY     PARTIAL MASTECTOMY WITH NEEDLE LOCALIZATION Right 01/04/2017   Procedure: PARTIAL MASTECTOMY WITH NEEDLE LOCALIZATION;  Surgeon: Leonie Green, MD;  Location: [facility] ORS;  Service: General;  Laterality: Right;   SENTINEL NODE BIOPSY Right 01/04/2017   Procedure: SENTINEL NODE BIOPSY;  Surgeon: Leonie Green, MD;  Location: [facility] ORS;  Service: General;  Laterality: Right;   THYROID SURGERY     total thyroidectomy   TONSILLECTOMY     TOTAL KNEE ARTHROPLASTY Left 07/23/2019   Procedure: TOTAL KNEE ARTHROPLASTY;  Surgeon: Corky Mull, MD;  Location: [facility] ORS;  Service: Orthopedics;  Laterality: Left;   TOTAL THYROIDECTOMY      Home Medications:  Allergies as of 11/15/2021       Reactions   Levaquin [levofloxacin] Rash   Penicillins Rash   Has patient had a PCN reaction causing immediate rash, facial/tongue/throat swelling, SOB or lightheadedness with hypotension: No Has patient had a PCN reaction causing severe rash involving mucus membranes or skin necrosis: No Has patient had a PCN reaction that required hospitalization: No Has patient had a PCN reaction occurring  within the last 10 years: No If all of the above answers are "NO", then may proceed with Cephalosporin use.   Trazodone Other (See Comments)   Headache and nightmares        Medication List        Accurate as of November 15, 2021  1:45 PM. If you have any questions, ask your nurse or doctor.          amLODipine 10 MG tablet Commonly known as: NORVASC Take by mouth.   aspirin EC 81 MG tablet Take by mouth.   aspirin 325 MG tablet Take 325 mg by mouth daily.   atorvastatin 40 MG tablet Commonly known as: LIPITOR Take 1 tablet (40 mg total) by mouth daily at 6  PM.   calcium-vitamin D 500-200 MG-UNIT tablet Commonly known as: OSCAL WITH D Take 2 tablets by mouth daily.   celecoxib 200 MG capsule Commonly known as: CELEBREX Take 200 mg by mouth daily.   hydrochlorothiazide 25 MG tablet Commonly known as: HYDRODIURIL Take 25 mg by mouth daily.   hydrOXYzine 25 MG tablet Commonly known as: ATARAX Take 25 mg by mouth 3 (three) times daily as needed.   ibuprofen 200 MG tablet Commonly known as: ADVIL Take by mouth.   levothyroxine 75 MCG tablet Commonly known as: SYNTHROID Take 75 mcg by mouth daily before breakfast.   lisinopril 40 MG tablet Commonly known as: ZESTRIL Take 1 tablet by mouth daily.   methocarbamol 500 MG tablet Commonly known as: Robaxin Take 1 tablet (500 mg total) by mouth 4 (four) times daily.   metoprolol tartrate 100 MG tablet Commonly known as: LOPRESSOR Take 100 mg by mouth 2 (two) times daily.   multivitamin with minerals Tabs tablet Take 2 tablets by mouth daily.   omeprazole 20 MG capsule Commonly known as: PRILOSEC Take 20 mg by mouth daily as needed (heartburn).   pantoprazole 40 MG tablet Commonly known as: PROTONIX Take 1 tablet by mouth daily.   PARoxetine 10 MG tablet Commonly known as: PAXIL Take 10 mg by mouth at bedtime.   predniSONE 50 MG tablet Commonly known as: DELTASONE Take 1 tablet (50 mg total) by mouth daily with breakfast.   zolpidem 5 MG tablet Commonly known as: AMBIEN Take 2.5 mg by mouth at bedtime as needed for sleep.        Allergies:  Allergies  Allergen Reactions   Levaquin [Levofloxacin] Rash   Penicillins Rash    Has patient had a PCN reaction causing immediate rash, facial/tongue/throat swelling, SOB or lightheadedness with hypotension: No Has patient had a PCN reaction causing severe rash involving mucus membranes or skin necrosis: No Has patient had a PCN reaction that required hospitalization: No Has patient had a PCN reaction occurring within the  last 10 years: No If all of the above answers are "NO", then may proceed with Cephalosporin use.    Trazodone Other (See Comments)    Headache and nightmares    Family History: Family History  Problem Relation Age of Onset   Breast cancer Sister 53   Lung cancer Sister    Diabetes Sister    Breast cancer Maternal Aunt    Diabetes Mother    Diabetes Father    Diabetes Brother    Diabetes Sister    Diabetes Sister    Diabetes Brother     Social History:  reports that she has never smoked. She has never used smokeless tobacco. She reports current alcohol use. She reports  that she does not use drugs.   Physical Exam: BP (!) 152/72   Pulse 60   Ht '4\' 11"'$  (1.499 m)   Wt 149 lb (67.6 kg)   BMI 30.09 kg/m   Constitutional:  Alert and oriented, No acute distress. HEENT: [place] AT, Respiratory: Normal respiratory effort, no increased work of breathing. Skin: No rashes, bruises or suspicious lesions. Neurologic: Grossly intact, no focal deficits, moving all 4 extremities. Psychiatric: Normal mood and affect.  Laboratory Data:  Urinalysis Dipstick/microscopy negative   Assessment & Plan:    1.  Overactive bladder We discussed her symptoms are consistent with overactive bladder She has rare episodes of urge incontinence Typically goes to bed around midnight and due to continued frequency and urgency is unable to fall asleep until 3 AM and then sleeps uninterrupted We discussed options of medical management and pelvic floor physical therapy She has elected medical management and given Gemtesa samples PA follow-up 1 month for symptom recheck PVR today was 0 mL   Abbie Sons, MD  [place] [address], [place], [place] [address] [phone]

## 2021-11-29 ENCOUNTER — Ambulatory Visit
Admission: RE | Admit: 2021-11-29 | Discharge: 2021-11-29 | Disposition: A | Discharge status: Home / Self Care | Payer: Medicare Other | Source: Ambulatory Visit | Attending: General Surgery | Admitting: General Surgery

## 2021-11-29 DIAGNOSIS — Z1231 Encounter for screening mammogram for malignant neoplasm of breast: Secondary | ICD-10-CM | POA: Insufficient documentation

## 2021-12-05 ENCOUNTER — Ambulatory Visit: Payer: Medicare Other | Admitting: Oncology

## 2021-12-13 ENCOUNTER — Ambulatory Visit: Payer: Medicare Other | Admitting: Oncology

## 2021-12-14 ENCOUNTER — Inpatient Hospital Stay: Payer: Medicare Other | Attending: Oncology | Admitting: Oncology

## 2021-12-14 ENCOUNTER — Encounter: Payer: Self-pay | Admitting: Oncology

## 2021-12-14 VITALS — BP 129/72 | HR 59 | Temp 97.9°F | Resp 16 | Ht 59.0 in | Wt 150.8 lb

## 2021-12-14 DIAGNOSIS — Z08 Encounter for follow-up examination after completed treatment for malignant neoplasm: Secondary | ICD-10-CM | POA: Insufficient documentation

## 2021-12-14 DIAGNOSIS — Z853 Personal history of malignant neoplasm of breast: Secondary | ICD-10-CM | POA: Insufficient documentation

## 2021-12-14 DIAGNOSIS — C50211 Malignant neoplasm of upper-inner quadrant of right female breast: Secondary | ICD-10-CM | POA: Diagnosis not present

## 2021-12-14 NOTE — Progress Notes (Signed)
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  INTERVAL HISTORY: Patient last evaluated in clinic in February 2022.  She returns to clinic today for further evaluation and discussion of her mammogram results.  She continues to feel well and remains asymptomatic.  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  She has no other neurologic complaints.  She denies any recent fevers or illnesses. She has a good appetite and denies weight loss. She denies any pain.  She denies any chest pain, shortness of breath, cough, or hemoptysis.  She denies any nausea, vomiting, constipation, or diarrhea. She has no urinary complaints.  Patient offers no further specific complaints today.  REVIEW OF SYSTEMS:   Review of Systems  Constitutional: Negative.  Negative for fever, malaise/fatigue and weight loss.  Eyes: Negative.   Respiratory: Negative.  Negative for cough and shortness of breath.   Cardiovascular: Negative.  Negative for palpitations and leg swelling.  Gastrointestinal: Negative.  Negative for abdominal pain.  Genitourinary: Negative.  Negative for dysuria.  Musculoskeletal: Negative.  Negative for back pain.  Skin: Negative.  Negative for rash.  Neurological:  Positive for focal weakness. Negative for weakness and headaches.  Psychiatric/Behavioral: Negative.  The patient is not nervous/anxious.     As per HPI. Otherwise, a complete review of systems is negative.  PAST MEDICAL HISTORY: Past Medical History:  Diagnosis Date   Borderline diabetes    Breast cancer, right ([place]) 11/2016   invasive mammary carcinoma, Lumpectomy and rad tx's.    Colon polyp    Diabetes mellitus  without complication ([place])    type 2   Hyperlipemia    Hypertension    Hyperthyroidism    Hypothyroidism    Insomnia    Osteopenia    Personal history of radiation therapy 2018   right breast cancer   Pneumonia    Stroke ([place]) 12/09/2018   right sided weakness, mild speech, unsteady gait     Vertigo     PAST SURGICAL HISTORY: Past Surgical History:  Procedure Laterality Date   ABDOMINAL HYSTERECTOMY     oophorectomy   APPENDECTOMY     BREAST BIOPSY Left    neg core   BREAST BIOPSY Right    neg  core   BREAST BIOPSY Right 11/2016   invasive mammary carcinoma, US   BREAST BIOPSY Right 11/2016   benign, stero   BREAST LUMPECTOMY Right 12/2016   invasive mammary carcinoma  Negative margins   CATARACT EXTRACTION W/ INTRAOCULAR LENS IMPLANT     CHOLECYSTECTOMY     COLONOSCOPY     COLONOSCOPY WITH PROPOFOL N/A 01/15/2018   Procedure: COLONOSCOPY WITH PROPOFOL;  Surgeon: Toledo, Benay Pike, MD;  Location: [facility];  Service: Gastroenterology;  Laterality: N/A;   EYE SURGERY     bilateral cataracts   OOPHORECTOMY     PARTIAL MASTECTOMY WITH NEEDLE LOCALIZATION Right 01/04/2017   Procedure: PARTIAL MASTECTOMY WITH NEEDLE LOCALIZATION;  Surgeon: Leonie Green, MD;  Location: [facility] OR[facility];  Service: General;  Laterality: Right;   SENTINEL NODE BIOPSY Right 01/04/2017   Procedure: SENTINEL NODE BIOPSY;  Surgeon: Leonie Green, MD;  Location: [facility];  Service: General;  Laterality: Right;   THYROID SURGERY  total thyroidectomy   TONSILLECTOMY     TOTAL KNEE ARTHROPLASTY Left 07/23/2019   Procedure: TOTAL KNEE ARTHROPLASTY;  Surgeon: Corky Mull, MD;  Location: [facility] OR[facility];  Service: Orthopedics;  Laterality: Left;   TOTAL THYROIDECTOMY      FAMILY HISTORY: Family History  Problem Relation Age of Onset   Breast cancer Sister 74   Lung cancer Sister    Diabetes Sister    Breast cancer Maternal Aunt    Diabetes Mother    Diabetes Father    Diabetes Brother     Diabetes Sister    Diabetes Sister    Diabetes Brother     ADVANCED DIRECTIVES (Y/N):  N  HEALTH MAINTENANCE: Social History   Tobacco Use   Smoking status: Never   Smokeless tobacco: Never  Vaping Use   Vaping Use: Never used  Substance Use Topics   Alcohol use: Not Currently   Drug use: No     Colonoscopy:  PAP:  Bone density:  Lipid panel:  Allergies  Allergen Reactions   Levaquin [Levofloxacin] Rash   Penicillins Rash    Has patient had a PCN reaction causing immediate rash, facial/tongue/throat swelling, SOB or lightheadedness with hypotension: No Has patient had a PCN reaction causing severe rash involving mucus membranes or skin necrosis: No Has patient had a PCN reaction that required hospitalization: No Has patient had a PCN reaction occurring within the last 10 years: No If all of the above answers are "NO", then may proceed with Cephalosporin use.    Trazodone Other (See Comments)    Headache and nightmares    Current Outpatient Medications  Medication Sig Dispense Refill   amLODipine (NORVASC) 10 MG tablet Take 10 mg by mouth daily.     aspirin 81 MG EC tablet Take by mouth.     atorvastatin (LIPITOR) 40 MG tablet Take 1 tablet (40 mg total) by mouth daily at 6 PM.     calcium-vitamin D (OSCAL WITH D) 500-200 MG-UNIT tablet Take 2 tablets by mouth daily.     celecoxib (CELEBREX) 200 MG capsule Take 200 mg by mouth daily.     hydrOXYzine (ATARAX/VISTARIL) 25 MG tablet Take 25 mg by mouth 3 (three) times daily as needed.     levothyroxine (SYNTHROID) 75 MCG tablet Take 75 mcg by mouth daily before breakfast.     lisinopril (ZESTRIL) 40 MG tablet Take 1 tablet by mouth daily.     metoprolol tartrate (LOPRESSOR) 100 MG tablet Take 100 mg by mouth 2 (two) times daily.     Multiple Vitamin (MULTIVITAMIN WITH MINERALS) TABS tablet Take 2 tablets by mouth daily.     omeprazole (PRILOSEC) 20 MG capsule Take 20 mg by mouth daily as needed (heartburn).      Vibegron (GEMTESA) 75 MG TABS Take 75 mg by mouth daily. 30 tablet 0   zolpidem (AMBIEN) 5 MG tablet Take 2.5 mg by mouth at bedtime as needed for sleep.     hydrochlorothiazide (HYDRODIURIL) 25 MG tablet Take 25 mg by mouth daily. (Patient not taking: Reported on 12/14/2021)     No current facility-administered medications for this visit.    OBJECTIVE: Vitals:   12/14/21 1446  BP: 129/72  Pulse: (!) 59  Resp: 16  Temp: 97.9 F (36.6 C)     Body mass index is 30.46 kg/m.    ECOG FS:0 - Asymptomatic  General: Well-developed, well-nourished, no acute distress. Eyes: Pink conjunctiva, anicteric sclera. HEENT: Normocephalic, moist mucous membranes. Breast: Exam  deferred today. Lungs: No audible wheezing or coughing. Heart: Regular rate and rhythm. Abdomen: Soft, nontender, no obvious distention. Musculoskeletal: No edema, cyanosis, or clubbing. Neuro: Alert, answering all questions appropriately. Cranial nerves grossly intact. Skin: No rashes or petechiae noted. Psych: Normal affect.   LAB RESULTS:  Lab Results  Component Value Date   NA 131 (L) 07/26/2019   K 3.5 07/26/2019   CL 94 (L) 07/26/2019   CO2 29 07/26/2019   GLUCOSE 99 07/26/2019   BUN 16 07/26/2019   CREATININE 0.77 07/26/2019   CALCIUM 8.6 (L) 07/26/2019   PROT 7.1 07/14/2019   ALBUMIN 4.2 07/14/2019   AST 25 07/14/2019   ALT 20 07/14/2019   ALKPHOS 71 07/14/2019   BILITOT 0.8 07/14/2019   GFRNONAA >60 07/26/2019   GFRAA >60 07/26/2019    Lab Results  Component Value Date   WBC 9.4 07/25/2019   NEUTROABS 3.9 07/14/2019   HGB 11.8 (L) 07/25/2019   HCT 34.0 (L) 07/25/2019   MCV 91.6 07/25/2019   PLT 205 07/25/2019     STUDIES: MM 3D SCREEN BREAST BILATERAL  Result Date: 11/30/2021 CLINICAL DATA:  Screening. EXAM: DIGITAL SCREENING BILATERAL MAMMOGRAM WITH TOMOSYNTHESIS AND CAD TECHNIQUE: Bilateral screening digital craniocaudal and mediolateral oblique mammograms were obtained. Bilateral  screening digital breast tomosynthesis was performed. The images were evaluated with computer-aided detection. COMPARISON:  Previous exam(s). ACR Breast Density Category b: There are scattered areas of fibroglandular density. FINDINGS: There are no findings suspicious for malignancy. IMPRESSION: No mammographic evidence of malignancy. A result letter of this screening mammogram will be mailed directly to the patient. RECOMMENDATION: Screening mammogram in one year. (Code:SM-B-01Y) BI-RADS CATEGORY  1: Negative. Electronically Signed   By: Ileana Roup M.D.   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  PLAN:    1. 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 Although she has triple negative disease, given the small size of tumor of 6 mm and her advanced age, it was agreed upon that adjuvant chemotherapy would not be necessary. Plus, patient had stated she would decline chemotherapy anyway.  She completed adjuvant XRT in January 2019. Given the ER/PR negativity of her malignancy, an aromatase inhibitor would not offer any benefit.  Her most recent mammogram on November 30, 2021 was reported as BI-RADS 1.  Repeat in August 2024.  After request and discussion with the patient, it was agreed upon that no further follow-up is necessary and her mammograms can be ordered by her primary care physician.  Please refer patient back if there are any questions or concerns.    I spent a total of 20 minutes reviewing chart data, face-to-face evaluation with the patient, counseling and coordination of care as detailed above.  Patient expressed understanding and was in agreement with this plan. She also understands that She can call clinic at any time with any questions, concerns, or complaints.     Cancer Staging  Primary  cancer of upper inner quadrant of right female breast [facility]) Staging form: Breast, AJCC 8th Edition - Clinical stage from 01/02/2017: Stage IB (cT1b, cN0, cM0, G3, ER-, PR-, HER2-) - Signed by Lloyd Huger, MD on 01/21/2017 Neoadjuvant therapy: No Histologic grading system: 3 grade system Laterality: Right - Pathologic stage from 01/23/2017: Stage IB (pT1b, pN0, cM0, G3, ER-, PR-, HER2-) -  Signed by Lloyd Huger, MD on 01/23/2017 Neoadjuvant therapy: No Histologic grading system: 3 grade system Laterality: Right   Lloyd Huger, MD   12/15/2021 3:37 PM

## 2021-12-18 ENCOUNTER — Ambulatory Visit: Payer: Medicare Other | Admitting: Physician Assistant

## 2021-12-18 ENCOUNTER — Encounter: Payer: Self-pay | Admitting: Physician Assistant

## 2021-12-18 VITALS — BP 144/80 | HR 58 | Ht 59.0 in | Wt 151.0 lb

## 2021-12-18 DIAGNOSIS — N3281 Overactive bladder: Secondary | ICD-10-CM

## 2021-12-18 LAB — URINALYSIS, COMPLETE
Bilirubin, UA: NEGATIVE
Glucose, UA: NEGATIVE
Leukocytes,UA: NEGATIVE
Nitrite, UA: NEGATIVE
Protein,UA: NEGATIVE
RBC, UA: NEGATIVE
Specific Gravity, UA: 1.015 (ref 1.005–1.030)
Urobilinogen, Ur: 0.2 mg/dL (ref 0.2–1.0)
pH, UA: 6.5 (ref 5.0–7.5)

## 2021-12-18 LAB — BLADDER SCAN AMB NON-IMAGING

## 2021-12-18 LAB — MICROSCOPIC EXAMINATION

## 2021-12-18 MED ORDER — MIRABEGRON ER 25 MG PO TB24: 25.0000 mg | ORAL_TABLET | Freq: Every day | ORAL | 0 refills | Status: DC

## 2021-12-18 NOTE — Progress Notes (Signed)
12/18/2021 1:30 PM   Meghan Dougherty [birth year] [id number]  CC: Chief Complaint  Patient presents with   Over Active Bladder    Medication caused diarrhea     HPI: Meghan Dougherty is a 81 y.o. 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   Today she reports she had significant symptomatic improvement on Gemtesa, however after approximately 4 days of taking it she developed diarrhea.  She ultimately stopped taking it after 3 weeks due to the diarrhea.  In-office UA today positive for trace ketones; urine microscopy with moderate bacteria. PVR 25m.  PMH: Past Medical History:  Diagnosis Date   Borderline diabetes    [place]   invasive mammary carcinoma, Lumpectomy and rad tx's.    Colon polyp    Diabetes mellitus without complication ([place]    type 2   Hyperlipemia    Hypertension    Hyperthyroidism    Hypothyroidism    Insomnia    Osteopenia    Personal history of radiation therapy 2018   right breast cancer   Pneumonia    [facility]   right sided weakness, mild speech, unsteady gait     Vertigo     Surgical History: Past Surgical History:  Procedure Laterality Date   ABDOMINAL HYSTERECTOMY     oophorectomy   APPENDECTOMY     BREAST BIOPSY Left    neg core   BREAST BIOPSY Right    neg  core   BREAST BIOPSY Right 11/2016   [place]  BREAST BIOPSY Right 11/2016   benign, stero   BREAST LUMPECTOMY Right 12/2016   invasive mammary carcinoma  Negative margins   CATARACT EXTRACTION W/ INTRAOCULAR LENS IMPLANT     CHOLECYSTECTOMY     COLONOSCOPY     COLONOSCOPY WITH PROPOFOL N/A 01/15/2018   Procedure: COLONOSCOPY WITH PROPOFOL;  Surgeon: Toledo, TBenay Pike MD;  Location: [facility] ENDOSCOPY;  Service: Gastroenterology;  Laterality: N/A;   EYE SURGERY     bilateral cataracts   OOPHORECTOMY      PARTIAL MASTECTOMY WITH NEEDLE LOCALIZATION Right 01/04/2017   Procedure: PARTIAL MASTECTOMY WITH NEEDLE LOCALIZATION;  Surgeon: SLeonie Green MD;  Location: [facility] ORS;  Service: General;  Laterality: Right;   SENTINEL NODE BIOPSY Right 01/04/2017   Procedure: SENTINEL NODE BIOPSY;  Surgeon: SLeonie Green MD;  Location: [facility] ORS;  Service: General;  Laterality: Right;   THYROID SURGERY     total thyroidectomy   TONSILLECTOMY     TOTAL KNEE ARTHROPLASTY Left 07/23/2019   Procedure: TOTAL KNEE ARTHROPLASTY;  Surgeon: PCorky Mull MD;  Location: [facility] ORS;  Service: Orthopedics;  Laterality: Left;   TOTAL THYROIDECTOMY      Home Medications:  Allergies as of 12/18/2021       Reactions   Levaquin [levofloxacin] Rash   Penicillins Rash   Has patient had a PCN reaction causing immediate rash, facial/tongue/throat swelling, SOB or lightheadedness with hypotension: No Has patient had a PCN reaction causing severe rash involving mucus membranes or skin necrosis: No Has patient had a PCN reaction that required hospitalization: No Has patient had a PCN reaction occurring within the last 10 years: No If all of the above answers are "NO", then may proceed with Cephalosporin use.   Trazodone Other (See Comments)   Headache and nightmares  Medication List        Accurate as of December 18, 2021  1:30 PM. If you have any questions, ask your nurse or doctor.          STOP taking these medications    hydrochlorothiazide 25 MG tablet Commonly known as: HYDRODIURIL Stopped by: Debroah Loop, PA-C       TAKE these medications    amLODipine 10 MG tablet Commonly known as: NORVASC Take 10 mg by mouth daily.   aspirin EC 81 MG tablet Take by mouth.   atorvastatin 40 MG tablet Commonly known as: LIPITOR Take 1 tablet (40 mg total) by mouth daily at 6 PM.   calcium-vitamin D 500-200 MG-UNIT tablet Commonly known as: OSCAL WITH D Take 2 tablets by mouth  daily.   celecoxib 200 MG capsule Commonly known as: CELEBREX Take 200 mg by mouth daily.   Gemtesa 75 MG Tabs Generic drug: Vibegron Take 75 mg by mouth daily.   hydrOXYzine 25 MG tablet Commonly known as: ATARAX Take 25 mg by mouth 3 (three) times daily as needed.   levothyroxine 75 MCG tablet Commonly known as: SYNTHROID Take 75 mcg by mouth daily before breakfast.   lisinopril 40 MG tablet Commonly known as: ZESTRIL Take 1 tablet by mouth daily.   metoprolol tartrate 100 MG tablet Commonly known as: LOPRESSOR Take 100 mg by mouth 2 (two) times daily.   multivitamin with minerals Tabs tablet Take 2 tablets by mouth daily.   omeprazole 20 MG capsule Commonly known as: PRILOSEC Take 20 mg by mouth daily as needed (heartburn).   zolpidem 5 MG tablet Commonly known as: AMBIEN Take 2.5 mg by mouth at bedtime as needed for sleep.        Allergies:  Allergies  Allergen Reactions   Levaquin [Levofloxacin] Rash   Penicillins Rash    Has patient had a PCN reaction causing immediate rash, facial/tongue/throat swelling, SOB or lightheadedness with hypotension: No Has patient had a PCN reaction causing severe rash involving mucus membranes or skin necrosis: No Has patient had a PCN reaction that required hospitalization: No Has patient had a PCN reaction occurring within the last 10 years: No If all of the above answers are "NO", then may proceed with Cephalosporin use.    Trazodone Other (See Comments)    Headache and nightmares    Family History: Family History  Problem Relation Age of Onset   Breast cancer Sister 43   Lung cancer Sister    Diabetes Sister    Breast cancer Maternal Aunt    Diabetes Mother    Diabetes Father    Diabetes Brother    Diabetes Sister    Diabetes Sister    Diabetes Brother     Social History:   reports that she has never smoked. She has never used smokeless tobacco. She reports that she does not currently use alcohol. She  reports that she does not use drugs.  Physical Exam: BP (!) 144/80   Pulse (!) 58   Ht '4\' 11"'$  (1.499 m)   Wt 151 lb (68.5 kg)   BMI 30.50 kg/m   Constitutional:  Alert and oriented, no acute distress, nontoxic appearing HEENT: [place], AT Cardiovascular: No clubbing, cyanosis, or edema Respiratory: Normal respiratory effort, no increased work of breathing Skin: No rashes, bruises or suspicious lesions Neurologic: Grossly intact, no focal deficits, moving all 4 extremities Psychiatric: Normal mood and affect  Laboratory Data: Results for orders placed or performed in visit on  12/18/21  Microscopic Examination   Urine  Result Value Ref Range   WBC, UA 0-5 0 - 5 /hpf   RBC, Urine 0-2 0 - 2 /hpf   Epithelial Cells (non renal) 0-10 0 - 10 /hpf   Bacteria, UA Moderate (A) None seen/Few  Urinalysis, Complete  Result Value Ref Range   Specific Gravity, UA 1.015 1.005 - 1.030   pH, UA 6.5 5.0 - 7.5   Color, UA Yellow Yellow   Appearance Ur Clear Clear   Leukocytes,UA Negative Negative   Protein,UA Negative Negative/Trace   Glucose, UA Negative Negative   Ketones, UA Trace (A) Negative   RBC, UA Negative Negative   Bilirubin, UA Negative Negative   Urobilinogen, Ur 0.2 0.2 - 1.0 mg/dL   Nitrite, UA Negative Negative   Microscopic Examination See below:   BLADDER SCAN AMB NON-IMAGING  Result Value Ref Range   Scan Result 22m    Assessment & Plan:   1. Overactive bladder Gemtesa improved her voiding symptoms, however she ultimately stopped it due to side effect of diarrhea.  I suggested switching to Myrbetriq 25 mg this month with plans for symptom recheck and PVR in 4 weeks.  She is in agreement with this plan.  If she develops diarrhea on Myrbetriq, we will try trospium next.  I am hesitant to start other antimuscarinics due to reports of memory loss secondary to prior CVA. - Urinalysis, Complete - BLADDER SCAN AMB NON-IMAGING - mirabegron ER (MYRBETRIQ) 25 MG TB24 tablet; Take 1  tablet (25 mg total) by mouth daily.  Dispense: 28 tablet; Refill: 0   Return in about 4 weeks (around 01/15/2022) for Symptom recheck with PVR.  SDebroah Loop PA-C  [facility] [address] [place] [phone]

## 2022-01-16 ENCOUNTER — Ambulatory Visit: Payer: Medicare Other | Admitting: Physician Assistant

## 2022-01-18 ENCOUNTER — Encounter: Payer: Self-pay | Admitting: Physician Assistant

## 2022-09-21 ENCOUNTER — Other Ambulatory Visit: Payer: Self-pay | Admitting: General Surgery

## 2022-09-21 DIAGNOSIS — Z1231 Encounter for screening mammogram for malignant neoplasm of breast: Secondary | ICD-10-CM

## 2022-12-03 ENCOUNTER — Ambulatory Visit
Admission: RE | Admit: 2022-12-03 | Discharge: 2022-12-03 | Disposition: A | Discharge status: Home / Self Care | Payer: Medicare Other | Source: Ambulatory Visit | Attending: General Surgery | Admitting: General Surgery

## 2022-12-03 DIAGNOSIS — Z1231 Encounter for screening mammogram for malignant neoplasm of breast: Secondary | ICD-10-CM | POA: Insufficient documentation

## 2023-08-16 ENCOUNTER — Emergency Department
Admission: EM | Admit: 2023-08-16 | Discharge: 2023-08-16 | Disposition: A | Discharge status: Home / Self Care | Attending: Emergency Medicine | Admitting: Emergency Medicine

## 2023-08-16 ENCOUNTER — Other Ambulatory Visit: Payer: Self-pay

## 2023-08-16 ENCOUNTER — Emergency Department

## 2023-08-16 DIAGNOSIS — Y9301 Activity, walking, marching and hiking: Secondary | ICD-10-CM | POA: Diagnosis not present

## 2023-08-16 DIAGNOSIS — W01198A Fall on same level from slipping, tripping and stumbling with subsequent striking against other object, initial encounter: Secondary | ICD-10-CM | POA: Insufficient documentation

## 2023-08-16 DIAGNOSIS — Z7982 Long term (current) use of aspirin: Secondary | ICD-10-CM | POA: Diagnosis not present

## 2023-08-16 DIAGNOSIS — S0083XA Contusion of other part of head, initial encounter: Secondary | ICD-10-CM | POA: Insufficient documentation

## 2023-08-16 DIAGNOSIS — W19XXXA Unspecified fall, initial encounter: Secondary | ICD-10-CM

## 2023-08-16 DIAGNOSIS — S0990XA Unspecified injury of head, initial encounter: Secondary | ICD-10-CM | POA: Diagnosis present

## 2023-08-16 NOTE — Discharge Instructions (Signed)
 Have been diagnosed with fall, left temporal ecchymosis in resolution.  Please come back to ED if you have new symptoms or symptoms worsen.  Please go to your PCP appointment in June for a follow-up.

## 2023-08-16 NOTE — ED Provider Notes (Signed)
 [facility] Provider Note    Event Date/Time   First MD Initiated Contact with Patient 08/16/23 1609     (approximate)   History   Head Injury    HPI  Meghan Dougherty is a 83 y.o. female    with a past medical history of CCM, chronic bilateral low back pain,  who presents to the ED complaining of fall last Monday. According to the patient, patient hit the corner of the wall.  Patient denies loss of consciousness, patient is taking aspirin  81 mg daily.  Patient states having pain in the left temporal area.  Patient came today because she did not have any relatives or transportation, she lives by herself, she is here with her son.  Patient denies cough, vomit, abdominal pain, urinary symptoms.       Physical Exam   Triage Vital Signs: ED Triage Vitals  Encounter Vitals Group     BP 08/16/23 1525 (!) 138/97     Systolic BP Percentile --      Diastolic BP Percentile --      Pulse Rate 08/16/23 1525 60     Resp 08/16/23 1525 18     Temp 08/16/23 1525 97.8 F (36.6 C)     Temp Source 08/16/23 1525 Oral     SpO2 08/16/23 1525 96 %     Weight 08/16/23 1522 138 lb (62.6 kg)     Height 08/16/23 1522 4\' 11"  (1.499 m)     Head Circumference --      Peak Flow --      Pain Score 08/16/23 1522 2     Pain Loc --      Pain Education --      Exclude from Growth Chart --     Most recent vital signs: Vitals:   08/16/23 1525  BP: (!) 138/97  Pulse: 60  Resp: 18  Temp: 97.8 F (36.6 C)  SpO2: 96%     Constitutional: Alert, NAD. Able to speak in complete sentences without cough or dyspnea  Eyes: Conjunctivae are normal.  PERRLA Head: Left temporal area: Presence of ecchymosis of 1 cm, and resolution.  No depression Nose: No congestion/rhinnorhea. Mouth/Throat: Mucous membranes are moist.   Neck: Painless ROM. Supple. No JVD, nodes, thyromegaly  Cardiovascular:   Good peripheral circulation.RRR no murmurs, gallops, rubs  Respiratory: Normal  respiratory effort.  No retractions. Clear to auscultation bilaterally without wheezing or crackles  Gastrointestinal: Soft and nontender.  Musculoskeletal:  no deformity Neurologic:  MAE spontaneously. No gross focal neurologic deficits are appreciated.  Skin:  Skin is warm, dry and intact. No rash noted. Psychiatric: Mood and affect are normal. Speech and behavior are normal.    ED Results / Procedures / Treatments   Labs (all labs ordered are listed, but only abnormal results are displayed) Labs Reviewed - No data to display   EKG     RADIOLOGY I independently reviewed and interpreted imaging and agree with radiologists findings.      PROCEDURES:  Critical Care performed:   Procedures   MEDICATIONS ORDERED IN ED: Medications - No data to display Clinical Course as of 08/16/23 1652  Fri Aug 16, 2023  1625 CT HEAD WO CONTRAST ( ) 1. No acute intracranial abnormality or acute traumatic injury identified. 2. 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   [AE]    Clinical Course User Index [AE] Awilda Lennox, PA-C    IMPRESSION / MDM /  ASSESSMENT AND PLAN / ED COURSE  I reviewed the triage vital signs and the nursing notes.  Differential diagnosis includes, but is not limited to, intracranial hemorrhage, fracture, subdural hematoma, stroke  Patient's presentation is most consistent with acute complicated illness / injury requiring diagnostic workup.   Patient's diagnosis is consistent with fall, left temporal ecchymosis in resolution. I independently reviewed and interpreted imaging and agree with radiologists findings.  Physical exam is reassuring.  I did review the patient's allergies and medications.The patient is in stable and satisfactory condition for discharge home  Patient will be discharged home without prescriptions. Patient is to follow up with PCP as needed or otherwise directed. Patient is given ED precautions to  return to the ED for any worsening or new symptoms. Discussed plan of care with patient, answered all of patient's questions, Patient agreeable to plan of care. Advised patient to take medications according to the instructions on the label. Discussed possible side effects of new medications. Patient verbalized understanding.    FINAL CLINICAL IMPRESSION(S) / ED DIAGNOSES   Final diagnoses:  Minor head injury, initial encounter  Fall, initial encounter     Rx / DC Orders   ED Discharge Orders     None        Note:  This document was prepared using Dragon voice recognition software and may include unintentional dictation errors.   Awilda Lennox, PA-C 08/16/23 1652    Shane Darling, MD 08/16/23 [phone]

## 2023-08-16 NOTE — ED Triage Notes (Signed)
 Patient states she was walking and hit head on door frame; denies thinners, no LOC.

## 2023-10-10 ENCOUNTER — Other Ambulatory Visit: Payer: Self-pay | Admitting: General Surgery

## 2023-10-10 DIAGNOSIS — Z1231 Encounter for screening mammogram for malignant neoplasm of breast: Secondary | ICD-10-CM

## 2023-12-04 ENCOUNTER — Ambulatory Visit
Admission: RE | Admit: 2023-12-04 | Discharge: 2023-12-04 | Disposition: A | Discharge status: Home / Self Care | Source: Ambulatory Visit | Attending: General Surgery | Admitting: General Surgery

## 2023-12-04 DIAGNOSIS — Z1231 Encounter for screening mammogram for malignant neoplasm of breast: Secondary | ICD-10-CM | POA: Insufficient documentation

## 2024-01-15 NOTE — Progress Notes (Signed)
 Chief Complaint  Patient presents with  . Diarrhea    Intermittent 2-3 weeks   . Edema    Intermittent of legs and ankles     HPI  History of Present Illness Meghan Dougherty is an 83 year old female who presents with diarrhea and leg swelling.  Diarrhea - Onset three weeks ago - Primarily occurs in the morning - Frequency has increased from every four to five days to daily, sometimes two to three times per day, and occasionally at night - Stools begin normal and then become watery - No blood in stools - No pain during defecation - No fevers - No recent changes in diet or medication regimen - No associated weight loss  Lower extremity edema - Intermittent swelling of the feet for the past one to two months - Swelling associated with color change of the toes and itching when legs are swollen - Edema worsens with walking - No swelling in the hands  Fatigue and functional limitation - Feels tired - Symptoms have limited ability to travel, unable to make a three hour trip to visit her son's new house  Antihypertensive medication use and adverse effects - Currently taking amlodipine  and lisinopril  for blood pressure management - History of hydrochlorothiazide  use resulting in hyponatremia  ROS Review of systems is unremarkable for any active cardiac, respiratory, GI, GU, hematologic, neurologic, dermatologic, HEENT, or psychiatric symptoms except as noted above.  No fevers, chills, or constitutional symptoms.   Current Outpatient Medications  Medication Sig Dispense Refill  . aspirin  81 MG EC tablet Take 81 mg by mouth once daily       . atorvastatin  (LIPITOR) 40 MG tablet TAKE 1 TABLET BY MOUTH AT  BEDTIME 100 tablet 2  . calcium  carbonate-vitamin D3 (CALTRATE 600+D) 600 mg(1,500mg ) -400 unit tablet Take 1 tablet by mouth 2 (two) times daily with meals.    . cetirizine (ZYRTEC) 10 MG tablet Take 1 tablet (10 mg total) by mouth once daily    . ibuprofen (MOTRIN) 200 MG  tablet Take 200 mg by mouth as needed for Pain    . levothyroxine  (SYNTHROID ) 50 MCG tablet TAKE 1 TABLET FRIDAY, SATURDAY, AND SUNDAY ON EMPTY STOMACH WITH A GLASS OF WATER AT LEAST 30-60 MINUTES BEFORE BREAKFAST 36 tablet 1  . levothyroxine  (SYNTHROID ) 75 MCG tablet TAKE 1 TABLET BY MOUTH MONDAY  THROUGH THURSDAY ON EMPTY  STOMACH WITH A GLASS OF WATER AT LEAST 30 TO 60 MINUTES BEFORE  BREAKFAST 58 tablet 2  . lisinopriL  (ZESTRIL ) 40 MG tablet TAKE 1 TABLET BY MOUTH ONCE  DAILY 100 tablet 2  . metoprolol  TARTrate (LOPRESSOR ) 100 MG tablet TAKE 1 TABLET BY MOUTH TWICE  DAILY 200 tablet 1  . multivitamin tablet Take 1 tablet by mouth once daily.    SABRA omeprazole (PRILOSEC) 20 MG DR capsule TAKE 1 CAPSULE BY MOUTH TWICE  DAILY BEFORE MEALS 180 capsule 1  . PARoxetine  (PAXIL ) 10 MG tablet Take 1 tablet (10 mg total) by mouth at bedtime 100 tablet 1  . UNABLE TO FIND OTC med for constipation    . zolpidem  (AMBIEN ) 5 MG tablet Take 0.5 tablets (2.5 mg total) by mouth at bedtime as needed for Sleep 45 tablet 0  . chlorthalidone 25 MG tablet Take 0.5 tablets (12.5 mg total) by mouth once daily 30 tablet 3   No current facility-administered medications for this visit.    Allergies as of 01/15/2024 - Reviewed 01/15/2024  Allergen Reaction Noted  .  Levaquin [levofloxacin] Rash 08/29/2017  . Penicillins Rash 09/04/2013  . Trazodone  Headache and Other (See Comments) 09/02/2013    Patient Active Problem List  Diagnosis  . Acquired hypothyroidism (TSH 1.5 - 09/09/23)  . Essential hypertension  . Pure hypercholesterolemia (LDL 60 - 09/09/23)  . Osteopenia (Dexa 11/08/15 - repeat 2 yrs)  . Nontoxic multinodular goiter  . Insomnia  . Type 2 diabetes mellitus with hyperlipidemia (A1c 6.6% - 09/09/23) - diet controlled  . Anxiety, generalized  . Initial Medicare Wellness Exam: 10/09/12;   . GERD without esophagitis  . Medicare annual wellness visit, subsequent 09/16/23  . History of CVA with residual deficit  (12/2018) on ASA 81 mg  . Lacunar stroke (CMS/HHS-HCC)  . Primary osteoarthritis of left knee  . Status post total knee replacement using cement, left  . History of right breast cancer s/p partial mastectomy (2018) - followed by Dr. Rodolph    Past Medical History:  Diagnosis Date  . Anxiety, generalized   . Breast cancer   . Colon polyp   . GERD (gastroesophageal reflux disease) Heartburn  . History of stroke   . Hyperlipidemia   . Hypertension   . Hypothyroidism, postsurgical   . Insomnia   . Osteopenia   . Primary osteoarthritis of left knee 06/29/2019  . Stage 3a chronic kidney disease (Cr 1.1 and GFR 48 - 12/05/21) 12/12/2021  . Type 2 diabetes mellitus with hyperlipidemia     Past Surgical History:  Procedure Laterality Date  . Right partial mastectomy with needle localization & senitel node biopsy  01/04/2017   Done by Dr. Claudene  . COLONOSCOPY  01/15/2018   normal colon/PHx CP/No repeat due to age/TKT  . Left TKA using all-cemented [employer] V[employer] system with a 62.5 mm PCR femur, a 67 mm tibial tray with a 12 mm anterior stabilized E-poly insert, and a 31 x 8 mm all-poly 3-pegged domed patella. Left 07/23/2019   Dr. Edie  . BILATERAL OOPHORECTOMY    . CATARACT EXTRACTION Bilateral   . CHOLECYSTECTOMY    . COLONOSCOPY     11/26/2012, 12/27/2012  . HYSTERECTOMY    . THYROIDECTOMY TOTAL    . TONSILLECTOMY Left    LEFT TONSIL REMOVED    Vitals:   01/15/24 1322 01/15/24 1328  BP: (!) 158/72 (!) 156/70  Pulse: 55   SpO2: 99%   Height: 149.9 cm (4' 11)   PainSc: 0-No pain    Body mass index is 28.68 kg/m.  Exam  General. Well appearing; NAD; VS reviewed     Eyes. Sclera and conjunctiva clear; Vision grossly intact; extraocular movements intact Neck. Supple.   Lungs. Respirations unlabored; clear to auscultation bilaterally Cardiovascular. Heart regular rate and rhythm without murmurs, gallops, or rubs Abdomen.  Soft, nontender, nondistended; no rebound or  guarding Skin. Normal color and turgor Extremities.  No palpable or visible edema Neurologic. Alert and oriented x3; CN 2-12 grossly intact; no focal deficits  Assessment and Plan  Assessment & Plan Chronic diarrhea Chronic diarrhea for three weeks, occurring two to three times daily, sometimes nocturnally. Stools start normal and progress to watery diarrhea. No blood, pain, or fever. No recent dietary changes or new medications. Differential includes infection. - Advise to avoid dairy and gluten - Order stool study for parasites or infection - Advise use of Imodium for travel-related diarrhea  Peripheral edema due to amlodipine  Intermittent swelling of feet and toes over the past month or two, associated with walking. Amlodipine , a blood pressure medication,  is known to cause peripheral edema. Previous diuretic use led to hyponatremia. Decision made to hold amlodipine  and start chlorthalidone at a low dose to manage edema while monitoring sodium levels. - Hold amlodipine  - Start chlorthalidone 12.5 mg once daily - Monitor blood pressure, symptoms, and sodium levels  Hypertension Hypertension managed with lisinopril  and previously with amlodipine . Transitioning from amlodipine  to chlorthalidone due to peripheral edema. - Continue lisinopril  as prescribed - Monitor blood pressure with new regimen  Follow-up as scheduled.  Labs prior.  Stool studies pending  ALDA CARPEN, MD

## 2024-01-22 NOTE — Progress Notes (Signed)
 01/22/2024- 3 min

## 2024-01-23 NOTE — Progress Notes (Signed)
 01/23/2024- 5 min

## 2024-01-26 ENCOUNTER — Emergency Department

## 2024-01-26 ENCOUNTER — Observation Stay

## 2024-01-26 ENCOUNTER — Observation Stay
Admission: EM | Admit: 2024-01-26 | Discharge: 2024-01-27 | Disposition: A | Discharge status: Home / Self Care | Attending: Family Medicine | Admitting: Family Medicine

## 2024-01-26 ENCOUNTER — Other Ambulatory Visit: Payer: Self-pay

## 2024-01-26 DIAGNOSIS — Z79899 Other long term (current) drug therapy: Secondary | ICD-10-CM | POA: Diagnosis not present

## 2024-01-26 DIAGNOSIS — I700 Atherosclerosis of aorta: Secondary | ICD-10-CM | POA: Diagnosis not present

## 2024-01-26 DIAGNOSIS — Z7982 Long term (current) use of aspirin: Secondary | ICD-10-CM | POA: Diagnosis not present

## 2024-01-26 DIAGNOSIS — Z7902 Long term (current) use of antithrombotics/antiplatelets: Secondary | ICD-10-CM | POA: Diagnosis not present

## 2024-01-26 DIAGNOSIS — E119 Type 2 diabetes mellitus without complications: Secondary | ICD-10-CM | POA: Diagnosis not present

## 2024-01-26 DIAGNOSIS — Z8673 Personal history of transient ischemic attack (TIA), and cerebral infarction without residual deficits: Secondary | ICD-10-CM | POA: Insufficient documentation

## 2024-01-26 DIAGNOSIS — R4781 Slurred speech: Secondary | ICD-10-CM | POA: Diagnosis present

## 2024-01-26 DIAGNOSIS — F419 Anxiety disorder, unspecified: Secondary | ICD-10-CM | POA: Insufficient documentation

## 2024-01-26 DIAGNOSIS — I639 Cerebral infarction, unspecified: Secondary | ICD-10-CM

## 2024-01-26 DIAGNOSIS — Z96652 Presence of left artificial knee joint: Secondary | ICD-10-CM | POA: Diagnosis not present

## 2024-01-26 DIAGNOSIS — E871 Hypo-osmolality and hyponatremia: Secondary | ICD-10-CM | POA: Diagnosis not present

## 2024-01-26 DIAGNOSIS — R29703 NIHSS score 3: Secondary | ICD-10-CM | POA: Diagnosis not present

## 2024-01-26 DIAGNOSIS — G459 Transient cerebral ischemic attack, unspecified: Secondary | ICD-10-CM | POA: Diagnosis not present

## 2024-01-26 DIAGNOSIS — E039 Hypothyroidism, unspecified: Secondary | ICD-10-CM | POA: Insufficient documentation

## 2024-01-26 DIAGNOSIS — I10 Essential (primary) hypertension: Secondary | ICD-10-CM | POA: Diagnosis not present

## 2024-01-26 DIAGNOSIS — R54 Age-related physical debility: Secondary | ICD-10-CM | POA: Insufficient documentation

## 2024-01-26 DIAGNOSIS — N3281 Overactive bladder: Secondary | ICD-10-CM | POA: Insufficient documentation

## 2024-01-26 DIAGNOSIS — Z853 Personal history of malignant neoplasm of breast: Secondary | ICD-10-CM | POA: Insufficient documentation

## 2024-01-26 LAB — DIFFERENTIAL
Abs Immature Granulocytes: 0.04 K/uL (ref 0.00–0.07)
Basophils Absolute: 0.1 K/uL (ref 0.0–0.1)
Basophils Relative: 1 %
Eosinophils Absolute: 0.3 K/uL (ref 0.0–0.5)
Eosinophils Relative: 5 %
Immature Granulocytes: 1 %
Lymphocytes Relative: 24 %
Lymphs Abs: 1.4 K/uL (ref 0.7–4.0)
Monocytes Absolute: 0.8 K/uL (ref 0.1–1.0)
Monocytes Relative: 14 %
Neutro Abs: 3.1 K/uL (ref 1.7–7.7)
Neutrophils Relative %: 55 %

## 2024-01-26 LAB — CBC
HCT: 40 % (ref 36.0–46.0)
HCT: 37.8 % (ref 36.0–46.0)
Hemoglobin: 13.8 g/dL (ref 12.0–15.0)
Hemoglobin: 13.5 g/dL (ref 12.0–15.0)
MCH: 32 pg (ref 26.0–34.0)
MCH: 32.1 pg (ref 26.0–34.0)
MCHC: 34.5 g/dL (ref 30.0–36.0)
MCHC: 35.7 g/dL (ref 30.0–36.0)
MCV: 92.8 fL (ref 80.0–100.0)
MCV: 89.8 fL (ref 80.0–100.0)
Platelets: 252 K/uL (ref 150–400)
Platelets: 253 K/uL (ref 150–400)
RBC: 4.31 MIL/uL (ref 3.87–5.11)
RBC: 4.21 MIL/uL (ref 3.87–5.11)
RDW: 11.8 % (ref 11.5–15.5)
RDW: 11.9 % (ref 11.5–15.5)
WBC: 5.7 K/uL (ref 4.0–10.5)
WBC: 6.9 K/uL (ref 4.0–10.5)
nRBC: 0 % (ref 0.0–0.2)
nRBC: 0 % (ref 0.0–0.2)

## 2024-01-26 LAB — OSMOLALITY: Osmolality: 273 mosm/kg — ABNORMAL LOW (ref 275–295)

## 2024-01-26 LAB — COMPREHENSIVE METABOLIC PANEL WITH GFR
ALT: 22 U/L (ref 0–44)
AST: 30 U/L (ref 15–41)
Albumin: 3.9 g/dL (ref 3.5–5.0)
Alkaline Phosphatase: 81 U/L (ref 38–126)
Anion gap: 13 (ref 5–15)
BUN: 19 mg/dL (ref 8–23)
CO2: 22 mmol/L (ref 22–32)
Calcium: 9.1 mg/dL (ref 8.9–10.3)
Chloride: 95 mmol/L — ABNORMAL LOW (ref 98–111)
Creatinine, Ser: 0.84 mg/dL (ref 0.44–1.00)
GFR, Estimated: >60 mL/min (ref >60)
Glucose, Bld: 103 mg/dL — ABNORMAL HIGH (ref 70–99)
Potassium: 3.6 mmol/L (ref 3.5–5.1)
Sodium: 130 mmol/L — ABNORMAL LOW (ref 135–145)
Total Bilirubin: 0.8 mg/dL (ref 0.0–1.2)
Total Protein: 6.9 g/dL (ref 6.5–8.1)

## 2024-01-26 LAB — PROTIME-INR
INR: 1 (ref 0.8–1.2)
Prothrombin Time: 13.2 s (ref 11.4–15.2)

## 2024-01-26 LAB — CREATININE, SERUM
Creatinine, Ser: 0.94 mg/dL (ref 0.44–1.00)
GFR, Estimated: 59 mL/min — ABNORMAL LOW (ref >60)

## 2024-01-26 LAB — ETHANOL: Alcohol, Ethyl (B): <15 mg/dL (ref <15)

## 2024-01-26 LAB — CBG MONITORING, ED: Glucose-Capillary: 130 mg/dL — ABNORMAL HIGH (ref 70–99)

## 2024-01-26 LAB — APTT: aPTT: 26 s (ref 24–36)

## 2024-01-26 MED ORDER — SODIUM CHLORIDE 0.9% FLUSH: 3.0000 mL | Freq: Once | INTRAVENOUS | Status: DC

## 2024-01-26 MED ORDER — MIRABEGRON ER 25 MG PO TB24: 25.0000 mg | ORAL_TABLET | Freq: Every day | ORAL | Status: DC

## 2024-01-26 MED ORDER — CLOPIDOGREL BISULFATE 75 MG PO TABS
75.0000 mg | ORAL_TABLET | Freq: Every day | ORAL | Status: DC
  Administered 2024-01-27: 75 mg via ORAL
  Filled 2024-01-26 (×3): qty 1

## 2024-01-26 MED ORDER — PANTOPRAZOLE SODIUM 40 MG PO TBEC
40.0000 mg | DELAYED_RELEASE_TABLET | Freq: Every day | ORAL | Status: DC
  Administered 2024-01-27: 40 mg via ORAL
  Filled 2024-01-26: qty 1

## 2024-01-26 MED ORDER — VITAMIN D 25 MCG (1000 UNIT) PO TABS
1000.0000 [IU] | ORAL_TABLET | Freq: Every day | ORAL | Status: DC
  Administered 2024-01-27: 1000 [IU] via ORAL
  Filled 2024-01-26: qty 1

## 2024-01-26 MED ORDER — ONDANSETRON HCL 4 MG PO TABS: 4.0000 mg | ORAL_TABLET | Freq: Four times a day (QID) | ORAL | Status: DC | PRN

## 2024-01-26 MED ORDER — SODIUM CHLORIDE 0.9 % IV SOLN: INTRAVENOUS | Status: DC

## 2024-01-26 MED ORDER — ONDANSETRON HCL 4 MG/2ML IJ SOLN: 4.0000 mg | Freq: Four times a day (QID) | INTRAMUSCULAR | Status: DC | PRN

## 2024-01-26 MED ORDER — ASPIRIN 81 MG PO TBEC
81.0000 mg | DELAYED_RELEASE_TABLET | Freq: Every day | ORAL | Status: DC
  Administered 2024-01-27: 81 mg via ORAL
  Filled 2024-01-26: qty 1

## 2024-01-26 MED ORDER — ACETAMINOPHEN 160 MG/5ML PO SOLN: 650.0000 mg | ORAL | Status: DC | PRN

## 2024-01-26 MED ORDER — CALCIUM CARBONATE 1250 (500 CA) MG PO TABS
500.0000 mg | ORAL_TABLET | Freq: Every day | ORAL | Status: DC
Start: 2024-01-27 — End: 2024-01-27
  Administered 2024-01-27: 1250 mg via ORAL
  Filled 2024-01-26: qty 1

## 2024-01-26 MED ORDER — ACETAMINOPHEN 325 MG PO TABS: 650.0000 mg | ORAL_TABLET | ORAL | Status: DC | PRN

## 2024-01-26 MED ORDER — ATORVASTATIN CALCIUM 20 MG PO TABS: 40.0000 mg | ORAL_TABLET | Freq: Every day | ORAL | Status: DC

## 2024-01-26 MED ORDER — ENOXAPARIN SODIUM 40 MG/0.4ML IJ SOSY
40.0000 mg | PREFILLED_SYRINGE | INTRAMUSCULAR | Status: DC
  Administered 2024-01-26: 40 mg via SUBCUTANEOUS
  Filled 2024-01-26: qty 0.4

## 2024-01-26 MED ORDER — POLYETHYLENE GLYCOL 3350 17 G PO PACK: 17.0000 g | PACK | Freq: Every day | ORAL | Status: DC | PRN

## 2024-01-26 MED ORDER — ACETAMINOPHEN 650 MG RE SUPP: 650.0000 mg | RECTAL | Status: DC | PRN

## 2024-01-26 MED ORDER — HYDROXYZINE HCL 50 MG PO TABS
25.0000 mg | ORAL_TABLET | Freq: Three times a day (TID) | ORAL | Status: DC | PRN
Start: 2024-01-26 — End: 2024-01-27

## 2024-01-26 MED ORDER — CALCIUM CARBONATE-VITAMIN D 500-200 MG-UNIT PO TABS: 2.0000 | ORAL_TABLET | Freq: Every day | ORAL | Status: DC

## 2024-01-26 MED ORDER — ZOLPIDEM TARTRATE 5 MG PO TABS: 2.5000 mg | ORAL_TABLET | Freq: Every evening | ORAL | Status: DC | PRN

## 2024-01-26 MED ORDER — LEVOTHYROXINE SODIUM 50 MCG PO TABS: 75.0000 ug | ORAL_TABLET | Freq: Every day | ORAL | Status: DC

## 2024-01-26 MED ORDER — STROKE: EARLY STAGES OF RECOVERY BOOK: Freq: Once | Status: AC

## 2024-01-26 MED ORDER — IOHEXOL 350 MG/ML SOLN
100.0000 mL | Freq: Once | INTRAVENOUS | Status: AC | PRN
  Administered 2024-01-26: 75 mL via INTRAVENOUS

## 2024-01-26 NOTE — Assessment & Plan Note (Signed)
 Hyponatremia Sodium 130 Unlikely to account for symptoms appears chronic for over 5 years according to outpatient notes this was secondary to her thiazide diuretic although she is not currently on this medication we will obtain studies and follow volume status appears replete currently Serum osmolality urine sodium and urine osmolality a.m. TSH and cortisol have been ordered

## 2024-01-26 NOTE — ED Notes (Signed)
 Code stroke called at 1:47

## 2024-01-26 NOTE — Progress Notes (Signed)
   01/26/24 1410  Spiritual Encounters  Type of Visit Initial  Care provided to: Patient;Family  Conversation partners present during encounter Physician;Nurse  Referral source Code page  Reason for visit Code  OnCall Visit Yes   Responded to Code Stroke.  Offered support to patient and family.

## 2024-01-26 NOTE — ED Notes (Signed)
 ED Provider at bedside.

## 2024-01-26 NOTE — ED Notes (Signed)
 CCMD notified of cardiac monitoring order.

## 2024-01-26 NOTE — Assessment & Plan Note (Signed)
 Concern for acute left MCA stroke vs. TIA (symptoms improving but not yet resolved, MRI pending) No acute stroke seen on CT head and no large vessel occlusion Patient presenting with right-sided facial droop and dysarthria and no other focal neurology last well-known at 11:30 AM today After review  code stroke with neurology patient elected against TPA and symptoms are improving Neurology consulted and recommends indefinite dual antiplatelet therapy given she failed aspirin  monotherapy Plan: N.p.o. until swallow screen PT OT, serial neurochecks, speech consultation, A1c, lipid panel, telemetry, transthoracic echocardiogram, MRI brain , continue high intensity statin, neurology consultation, continue aspirin  and clopidogrel  definitely

## 2024-01-26 NOTE — ED Notes (Signed)
 Secretary Ronnie called for code stroke

## 2024-01-26 NOTE — Assessment & Plan Note (Signed)
 Hypertension Permissive hypertension for 24 hours, holding lisinopril  40 mg metoprolol  100 mg and amlodipine  10 mg, given she did not receive tPA we will only introduce agents at t 220/120

## 2024-01-26 NOTE — ED Notes (Signed)
 Lindzen, MD at the bedside

## 2024-01-26 NOTE — ED Notes (Signed)
 CT 1 for pt Lang RN taking pt now

## 2024-01-26 NOTE — Assessment & Plan Note (Signed)
 Anxiety disorder Currently well-controlled continue hydralazine as needed

## 2024-01-26 NOTE — ED Provider Notes (Signed)
 [facility] Provider Note    Event Date/Time   First MD Initiated Contact with Patient 01/26/24 1351     (approximate)   History   Code Stroke   HPI  Meghan Dougherty is a 83 y.o. female who presents to the emergency department today because of concerns for slurred speech.  Started about an hour prior to evaluation.  Patient denies any extremity weakness.  At the time my exam she does feel like it is improving.  She denies any recent trauma to her head.  Denies any recent illness.     Physical Exam   Triage Vital Signs: ED Triage Vitals  Encounter Vitals Group     BP 01/26/24 1348 (!) 178/76     Girls Systolic BP Percentile --      Girls Diastolic BP Percentile --      Boys Systolic BP Percentile --      Boys Diastolic BP Percentile --      Pulse Rate 01/26/24 1348 (!) 58     Resp 01/26/24 1348 18     Temp 01/26/24 1348 98 F (36.7 C)     Temp src --      SpO2 01/26/24 1348 99 %     Weight 01/26/24 1348 140 lb (63.5 kg)     Height 01/26/24 1348 5' (1.524 m)     Head Circumference --      Peak Flow --      Pain Score --      Pain Loc --      Pain Education --      Exclude from Growth Chart --     Most recent vital signs: Vitals:   01/26/24 1348  BP: (!) 178/76  Pulse: (!) 58  Resp: 18  Temp: 98 F (36.7 C)  SpO2: 99%   General: Awake, alert, oriented. CV:  Good peripheral perfusion. Regular rate and rhythm. Resp:  Normal effort. Lungs clear. Abd:  No distention.  Other:  Some word finding difficulty, slurred speech   ED Results / Procedures / Treatments   Labs (all labs ordered are listed, but only abnormal results are displayed) Labs Reviewed  CBG MONITORING, ED - Abnormal; Notable for the following components:      Result Value   Glucose-Capillary 130 (*)    All other components within normal limits  CBC  DIFFERENTIAL  PROTIME-INR  APTT  COMPREHENSIVE METABOLIC PANEL WITH GFR  ETHANOL  CBG MONITORING, ED   I-STAT CREATININE, ED      RADIOLOGY I independently interpreted and visualized the CT head. My interpretation: No [facility] Radiology interpretation:  IMPRESSION:  1. No acute intracranial abnormality.  2. ASPECTS 10.  3. Moderate to severe chronic microvascular ischemic changes.  4. Remote infarct in the left basal ganglia.  5. Likely small remote infarct in the left cerebellum.      PROCEDURES:  Critical Care performed: No    MEDICATIONS ORDERED IN ED: Medications  sodium chloride  flush (NS) 0.9 % injection 3 mL ( Intravenous Canceled Entry 01/26/24 1351)     IMPRESSION / MDM / ASSESSMENT AND PLAN / ED COURSE  I reviewed the triage vital signs and the nursing notes.                              Differential diagnosis includes, but is not limited to, CVA, TIA, anemia, electrolyte abnormality  Patient's presentation is most consistent with  acute presentation with potential threat to life or bodily function.   The patient is on the cardiac monitor to evaluate for evidence of arrhythmia and/or significant heart rate changes.  Patient presented to the emergency department today because of concerns for slurred speech.  She was called a code stroke.  Dr. Lindzen with neurology evaluated.  Offered TNK however patient declined at this time.  At the time my exam she does state that her symptoms seem to be improving.  Denies any recent illness.  CT head without concerning findings. Neurology would like to obtain CTA once chemistry has resulted. Awaiting CTA and further neuro recommendations at time of sign out.      FINAL CLINICAL IMPRESSION(S) / ED DIAGNOSES   Final diagnoses:  Slurred speech       Note:  This document was prepared using Dragon voice recognition software and may include unintentional dictation errors.    Floy Roberts, MD 01/26/24 [phone]

## 2024-01-26 NOTE — H&P (Addendum)
 History and Physical    Patient: Meghan Dougherty FMW:[id number] DOB: 23-Apr-1940 DOA: 01/26/2024 DOS: the patient was seen and examined on 01/26/2024 PCP: Alla Amis, MD  Patient coming from: Home  Chief Complaint:  Chief Complaint  Patient presents with   Code Stroke   HPI:   83 year old female with a past medical history of hypothyroidism, hypertension, anxiety, overactive bladder, breast cancer, CVA in 12/2018 who presented with concerns for stroke.  The patient's son Meghan Dougherty at the bedside provides some collateral history the patient was at her baseline at approximately 11:30 AM today when the patient son left the patient's house and then when he returned approximately 12:30 PM the patient had significant dysarthria but no motor symptoms or further word finding difficulties.  Since her CVA 12/2018 the patient only had dysarthria with significant long sentences but not at baseline she exhibited today.  The son reports this is significantly improved since presentation to the emergency department.  She was noted as well to have facial droop on the right side. Denies dysuria or any localizing infective symptoms to think recrudescence.   The patient was reviewed in the emergency department as a code stroke by neurology.  After shared decision making with the neurologist the patient decided against tPA although this was offered.  Past medical records reviewed and summarized: Patient's last outpatient family medicine note from 01/15/2024 was reviewed: Patient had lower extremity swelling and history of hydrochlorothiazide  usage resulting in hyponatremia  CT Head and Neck IMPRESSION: 1. No large vessel occlusion, hemodynamically significant stenosis, or aneurysm in the head or neck. 2. Mild atherosclerosis of the visualized aortic arch, proximal right subclavian artery, and left subclavian artery origin without significant stenosis. 3. Mild atherosclerosis at the carotid bifurcations,  both slightly increased from prior, without hemodynamically significant stenosis.  CT Head: IMPRESSION: 1. No acute intracranial abnormality. 2. ASPECTS 10. 3. Moderate to severe chronic microvascular ischemic changes. 4. Remote infarct in the left basal ganglia. 5. Likely small remote infarct in the left cerebellum.   Twelve-lead ECG independently reviewed and interpreted patient is in sinus bradycardia with ventricular rate of 58 PR interval 182 QTc 455 and no acute ST changes   Review of Systems: Constitutional: Denies Weight Loss, Fever, Chills or Night Sweats Eyes: Denies Blurry Vision, Eye Pain or Decreased Vision ENT: Denies Sore Throat , Tinnitus, Hearing Loss Respiratory: Denies Shortness of Breath, Cough, Hemoptysis, Wheezing, Pleurisy Cardiovascular: Denies Chest Pain, Paroxsymal Nocturnal Dyspnea, Palpitations, Edema Gastrointestinal: Denies Nausea, Vomiting, Diarrhea, Hematemesis, Melena Genitourinary: Denies hematuria, dysuria, hesitancy, incontinence Musculoskeletal: Denies Arthralgias, Myalgias, Muscle Weakness, Joint Swelling Skin: Denies Rash, Pruritis, Sores, Nail Changes or Skin Thickening Neurological: Denies Migraines, Numbness, Ataxia, Tremors, Vertigo but reports dysarthria Hem/Lymphatic: Denies Easy Bruising, Blood Clots Allergy/Immun: Denies history of Hay Fever, Positive PPD or Hives All other systems were reviewed and are negative  Past Medical History:  Diagnosis Date   Borderline diabetes    Breast cancer, right (HCC) 11/2016   invasive mammary carcinoma, Lumpectomy and rad tx's.    Colon polyp    Diabetes mellitus without complication (HCC)    type 2   Hyperlipemia    Hypertension    Hyperthyroidism    Hypothyroidism    Insomnia    Osteopenia    Personal history of radiation therapy 2018   right breast cancer   Pneumonia    Stroke (HCC) 12/09/2018   right sided weakness, mild speech, unsteady gait     Vertigo    Past Surgical History:  Procedure Laterality Date   ABDOMINAL HYSTERECTOMY     oophorectomy   APPENDECTOMY     BREAST BIOPSY Left    neg Arnelle Nale   BREAST BIOPSY Right    neg  Estera Ozier   BREAST BIOPSY Right 11/2016   invasive mammary carcinoma, US    BREAST BIOPSY Right 11/2016   benign, stero   BREAST LUMPECTOMY Right 12/2016   invasive mammary carcinoma  Negative margins   CATARACT EXTRACTION W/ INTRAOCULAR LENS IMPLANT     CHOLECYSTECTOMY     COLONOSCOPY     COLONOSCOPY WITH PROPOFOL  N/A 01/15/2018   Procedure: COLONOSCOPY WITH PROPOFOL ;  Surgeon: Toledo, Ladell POUR, MD;  Location: [facility] ENDOSCOPY;  Service: Gastroenterology;  Laterality: N/A;   EYE SURGERY     bilateral cataracts   OOPHORECTOMY     PARTIAL MASTECTOMY WITH NEEDLE LOCALIZATION Right 01/04/2017   Procedure: PARTIAL MASTECTOMY WITH NEEDLE LOCALIZATION;  Surgeon: Claudene Larinda Bolder, MD;  Location: [facility] ORS;  Service: General;  Laterality: Right;   SENTINEL NODE BIOPSY Right 01/04/2017   Procedure: SENTINEL NODE BIOPSY;  Surgeon: Claudene Larinda Bolder, MD;  Location: [facility] ORS;  Service: General;  Laterality: Right;   THYROID  SURGERY     total thyroidectomy   TONSILLECTOMY     TOTAL KNEE ARTHROPLASTY Left 07/23/2019   Procedure: TOTAL KNEE ARTHROPLASTY;  Surgeon: Edie Norleen PARAS, MD;  Location: [facility] ORS;  Service: Orthopedics;  Laterality: Left;   TOTAL THYROIDECTOMY     Social History:  reports that she has never smoked. She has never used smokeless tobacco. She reports that she does not currently use alcohol. She reports that she does not use drugs.  Allergies  Allergen Reactions   Levaquin [Levofloxacin] Rash   Penicillins Rash    Has patient had a PCN reaction causing immediate rash, facial/tongue/throat swelling, SOB or lightheadedness with hypotension: No Has patient had a PCN reaction causing severe rash involving mucus membranes or skin necrosis: No Has patient had a PCN reaction that required hospitalization: No Has patient had a PCN  reaction occurring within the last 10 years: No If all of the above answers are NO, then may proceed with Cephalosporin use.    Trazodone  Other (See Comments)    Headache and nightmares    Family History  Problem Relation Age of Onset   Breast cancer Sister 91   Lung cancer Sister    Diabetes Sister    Breast cancer Maternal Aunt    Diabetes Mother    Diabetes Father    Diabetes Brother    Diabetes Sister    Diabetes Sister    Diabetes Brother     Prior to Admission medications   Medication Sig Start Date End Date Taking? Authorizing Provider  amLODipine  (NORVASC ) 10 MG tablet Take 10 mg by mouth daily. 05/23/20  Yes [provider]  aspirin  81 MG EC tablet Take 81 mg by mouth daily.   Yes [provider]  atorvastatin  (LIPITOR) 40 MG tablet Take 1 tablet (40 mg total) by mouth daily at 6 PM. 12/19/18  Yes Tobie Press, MD  azithromycin (ZITHROMAX) 250 MG tablet Take 250-500 mg by mouth daily. 01/23/24 01/28/24 Yes [provider]  calcium -vitamin D  (OSCAL WITH D) 500-200 MG-UNIT tablet Take 2 tablets by mouth daily.   Yes [provider]  cetirizine (ZYRTEC) 10 MG tablet Take 10 mg by mouth daily. 10/08/23  Yes [provider]  chlorthalidone (HYGROTON) 25 MG tablet Take 12.5 mg by mouth daily. 01/15/24  Yes  [provider]  levothyroxine  (SYNTHROID ) 75 MCG tablet Take 75 mcg by mouth daily before breakfast.   Yes [provider]  lisinopril  (ZESTRIL ) 40 MG tablet Take 1 tablet by mouth daily. 07/15/19  Yes [provider]  metoprolol  tartrate (LOPRESSOR ) 100 MG tablet Take 100 mg by mouth 2 (two) times daily. 09/14/16  Yes [provider]  Multiple Vitamin (MULTIVITAMIN WITH MINERALS) TABS tablet Take 2 tablets by mouth daily.   Yes [provider]  omeprazole (PRILOSEC) 20 MG capsule Take 20 mg by mouth daily as needed (heartburn).   Yes [provider]  PARoxetine  (PAXIL ) 10 MG tablet  Take 10 mg by mouth daily.   Yes [provider]  zolpidem  (AMBIEN ) 5 MG tablet Take 2.5 mg by mouth at bedtime as needed for sleep. 05/05/19  Yes [provider]    Physical Exam: Vitals:   01/26/24 1500 01/26/24 1730 01/26/24 1800 01/26/24 1934  BP: (!) 168/93 (!) 169/71 (!) 187/73 (!) 158/61  Pulse: 62 66 (!) 59 (!) 59  Resp: 19   18  Temp:    98.5 F (36.9 C)  TempSrc:    Oral  SpO2: 100% 100% 100% 100%  Weight:      Height:       Patient seen and examined at 4:16 PM with the patient's son Meghan Dougherty at the bedside who helps with significant amount of the history Constitutional:  Vital Signs as per [facility] than three noted] No Acute Distress Eyes:  Pink Conjunctiva and no Ptosis ENMT:   External Appearance of Ears and Nose without obvious deformity, masses or scar Neck:     Trachea Midline, Neck Symmetric             Thyroid  without tenderness, palpable masses or nodules Respiratory:   Respiratory Effort Normal: No Use of Respiratory Muscles,No  Intercostal Retractions             Lungs Clear to Auscultation Bilaterally Cardiovascular:   Heart Auscultated: Regular Regular without any added sounds or murmurs              No Lower Extremity Edema Gastrointestinal:  Abdomen soft and nontender without palpable masses, guarding or rebound  No Palpable Splenomegaly or Hepatomegaly Neurologic:  Bilateral upper and lower extremity 5 out of 5 power, no dysdiadochokinesis, patient with significant dysarthria to both no ifs, ands or buts and [address], slight right-sided facial droop although extraocular movements and visual fields intact, shoulder shrug intact  Psychiatric:  Patient Orientated to Time, Place and Person Patient with appropriate mood and affect Recent and Remote Memory Intact  Data Reviewed: Labs, Radiology, ECG as detailed in HPI and A/P   Assessment and Plan: * TIA (transient ischemic attack) Concern for acute left MCA stroke  vs. TIA (symptoms improving but not yet resolved, MRI pending) No acute stroke seen on CT head and no large vessel occlusion Patient presenting with right-sided facial droop and dysarthria and no other focal neurology last well-known at 11:30 AM today After review  code stroke with neurology patient elected against TPA and symptoms are improving Neurology consulted and recommends indefinite dual antiplatelet therapy given she failed aspirin  monotherapy Plan: N.p.o. until swallow screen PT OT, serial neurochecks, speech consultation, A1c, lipid panel, telemetry, transthoracic echocardiogram, MRI brain , continue high intensity statin, neurology consultation, continue aspirin  and clopidogrel  definitely   Hyponatremia Hyponatremia Sodium 130 Unlikely to account for symptoms appears chronic for over 5 years according to  outpatient notes this was secondary to her thiazide diuretic although she is not currently on this medication we will obtain studies and follow volume status appears replete currently Serum osmolality urine sodium and urine osmolality a.m. TSH and cortisol have been ordered   Essential hypertension Hypertension Permissive hypertension for 24 hours, holding lisinopril  40 mg metoprolol  100 mg and amlodipine  10 mg, given she did not receive tPA we will only introduce agents at t 220/120  Aortic atherosclerosis Aortic atherosclerosis Incidentally found on the imaging continue high intensity statin outpatient follow-up    Overactive bladder Overactive bladder Continue Mirabegron    Hypothyroidism  Hypothyroidism Continue levothyroxine  75 mcg obtain a.m. TSH  Anxiety disorder Anxiety disorder Currently well-controlled continue hydralazine as needed       Advance Care Planning:   Code Status: Full Code as per patient   Consults: Neurology  Family Communication: Son at Bedside  Severity of Illness: The appropriate patient status for this patient is OBSERVATION.  Observation status is judged to be reasonable and necessary in order to provide the required intensity of service to ensure the patient's safety. The patient's presenting symptoms, physical exam findings, and initial radiographic and laboratory data in the context of their medical condition is felt to place them at decreased risk for further clinical deterioration. Furthermore, it is anticipated that the patient will be medically stable for discharge from the hospital within 2 midnights of admission.   Author: Prentice JAYSON Lowenstein, MD 01/26/2024 7:41 PM  For on call review [email or web address].

## 2024-01-26 NOTE — Assessment & Plan Note (Signed)
 Overactive bladder Continue Mirabegron 

## 2024-01-26 NOTE — Assessment & Plan Note (Signed)
  Hypothyroidism Continue levothyroxine  75 mcg obtain a.m. TSH

## 2024-01-26 NOTE — ED Notes (Addendum)
 Pt started to have slurred speech about hour ago. Pt family reports she was fine and he returned home and pt speech was all slurred.   Pt denies any weakness. Pt does have left sided facial droop and slurred speech. Pt denies any numbness. Pt has hx of stroke and HTN. Pt is on aspirin . CBG 130

## 2024-01-26 NOTE — Consult Note (Signed)
 NEUROLOGY CONSULT NOTE   Date of service: January 26, 2024 Patient Name: Fabienne Nolasco MRN:  [id number] DOB:  1940-06-10 Chief Complaint: Slurred speech Requesting Provider: Core, Prentice BROCKS, MD  History of Present Illness  Essense Bousquet is a 83 y.o. female with a PMHx of stroke (manifested as right sided weakness, right facial droop and dysphasia, essentially completely resolved per son), DM2, right invasive mammary carcinoma s/p lumpectomy and radiation treatment, HLD, HTN, thyroid  disease, osteopenia and vertigo  who presents with acute onset of dysphasia and dysarthria. Her son reports she was fine at 1250 and when he returned home her speech was slurred. The patient denies any weakness. In Triage she did have facial droop and slurred speech. CBG was 130.   She denies any additional symptoms including no vision loss, facial droop, limb weakness, limb numbness, confusion, dizziness or ataxia. Does not endorse any headache pain.   Home medications include ASA and atorvastatin .   LKW: 1250 Modified rankin score: 1-No significant post stroke disability and can perform usual duties with stroke symptoms IV Thrombolysis/EVT: No: Symptoms too mild to treat   NIHSS components Score: Comment  1a Level of Conscious 0[x]  1[]  2[]  3[]      1b LOC Questions 0[x]  1[]  2[]       1c LOC Commands 0[x]  1[]  2[]       2 Best Gaze 0[x]  1[]  2[]       3 Visual 0[x]  1[]  2[]  3[]      4 Facial Palsy 0[]  1[x]  2[]  3[]     Chronic and at baseline  5a Motor Arm - left 0[x]  1[]  2[]  3[]  4[]  UN[]    5b Motor Arm - Right 0[x]  1[]  2[]  3[]  4[]  UN[]    6a Motor Leg - Left 0[x]  1[]  2[]  3[]  4[]  UN[]    6b Motor Leg - Right 0[x]  1[]  2[]  3[]  4[]  UN[]    7 Limb Ataxia 0[x]  1[]  2[]  UN[]      8 Sensory 0[x]  1[]  2[]  UN[]      9 Best Language 0[]  1[x]  2[]  3[]      10 Dysarthria 0[]  1[x]  2[]  UN[]      11 Extinct. and Inattention 0[x]  1[]  2[]       TOTAL:   3     ROS  Comprehensive ROS performed and pertinent positives  documented in HPI    Past History   Past Medical History:  Diagnosis Date   Borderline diabetes    Breast cancer, right (HCC) 11/2016   invasive mammary carcinoma, Lumpectomy and rad tx's.    Colon polyp    Diabetes mellitus without complication (HCC)    type 2   Hyperlipemia    Hypertension    Hyperthyroidism    Hypothyroidism    Insomnia    Osteopenia    Personal history of radiation therapy 2018   right breast cancer   Pneumonia    Stroke (HCC) 12/09/2018   right sided weakness, mild speech, unsteady gait     Vertigo     Past Surgical History:  Procedure Laterality Date   ABDOMINAL HYSTERECTOMY     oophorectomy   APPENDECTOMY     BREAST BIOPSY Left    neg core   BREAST BIOPSY Right    neg  core   BREAST BIOPSY Right 11/2016   invasive mammary carcinoma, US    BREAST BIOPSY Right 11/2016   benign, stero   BREAST LUMPECTOMY Right 12/2016   invasive mammary carcinoma  Negative margins   CATARACT EXTRACTION W/ INTRAOCULAR LENS IMPLANT  CHOLECYSTECTOMY     COLONOSCOPY     COLONOSCOPY WITH PROPOFOL  N/A 01/15/2018   Procedure: COLONOSCOPY WITH PROPOFOL ;  Surgeon: Toledo, Ladell POUR, MD;  Location: [facility];  Service: Gastroenterology;  Laterality: N/A;   EYE SURGERY     bilateral cataracts   OOPHORECTOMY     PARTIAL MASTECTOMY WITH NEEDLE LOCALIZATION Right 01/04/2017   Procedure: PARTIAL MASTECTOMY WITH NEEDLE LOCALIZATION;  Surgeon: Claudene Larinda Bolder, MD;  Location: [facility];  Service: General;  Laterality: Right;   SENTINEL NODE BIOPSY Right 01/04/2017   Procedure: SENTINEL NODE BIOPSY;  Surgeon: Claudene Larinda Bolder, MD;  Location: [facility];  Service: General;  Laterality: Right;   THYROID  SURGERY     total thyroidectomy   TONSILLECTOMY     TOTAL KNEE ARTHROPLASTY Left 07/23/2019   Procedure: TOTAL KNEE ARTHROPLASTY;  Surgeon: Edie Norleen PARAS, MD;  Location: [facility];  Service: Orthopedics;  Laterality: Left;   TOTAL THYROIDECTOMY      Family  History: Family History  Problem Relation Age of Onset   Breast cancer Sister 79   Lung cancer Sister    Diabetes Sister    Breast cancer Maternal Aunt    Diabetes Mother    Diabetes Father    Diabetes Brother    Diabetes Sister    Diabetes Sister    Diabetes Brother     Social History  reports that she has never smoked. She has never used smokeless tobacco. She reports that she does not currently use alcohol. She reports that she does not use drugs.  Allergies  Allergen Reactions   Levaquin [Levofloxacin] Rash   Penicillins Rash    Has patient had a PCN reaction causing immediate rash, facial/tongue/throat swelling, SOB or lightheadedness with hypotension: No Has patient had a PCN reaction causing severe rash involving mucus membranes or skin necrosis: No Has patient had a PCN reaction that required hospitalization: No Has patient had a PCN reaction occurring within the last 10 years: No If all of the above answers are NO, then may proceed with Cephalosporin use.    Trazodone  Other (See Comments)    Headache and nightmares    Medications   Current Facility-Administered Medications:    [START ON 01/27/2024]  stroke: early stages of recovery book, , Does not apply, Once, Core, Prentice BROCKS, MD   0.9 %  sodium chloride  infusion, , Intravenous, Continuous, Core, Prentice BROCKS, MD   acetaminophen  (TYLENOL ) tablet 650 mg, 650 mg, Oral, Q4H PRN **OR** acetaminophen  (TYLENOL ) 160 MG/5ML solution 650 mg, 650 mg, Per Tube, Q4H PRN **OR** acetaminophen  (TYLENOL ) suppository 650 mg, 650 mg, Rectal, Q4H PRN, Core, Prentice BROCKS, MD   aspirin  EC tablet 81 mg, 81 mg, Oral, Daily, Core, Prentice BROCKS, MD   atorvastatin  (LIPITOR) tablet 40 mg, 40 mg, Oral, q1800, Core, Prentice BROCKS, MD   calcium -vitamin D  (OSCAL WITH D) 500-200 MG-UNIT per tablet 2 tablet, 2 tablet, Oral, Daily, Core, Prentice BROCKS, MD   clopidogrel  (PLAVIX ) tablet 75 mg, 75 mg, Oral, Daily, Charnay Nazario, MD   enoxaparin  (LOVENOX ) injection 40 mg,  40 mg, Subcutaneous, Q24H, Core, Prentice BROCKS, MD   hydrOXYzine (ATARAX) tablet 25 mg, 25 mg, Oral, TID PRN, Core, Prentice BROCKS, MD   [START ON 01/27/2024] levothyroxine  (SYNTHROID ) tablet 75 mcg, 75 mcg, Oral, QAC breakfast, Core, Prentice BROCKS, MD   mirabegron  ER (MYRBETRIQ ) tablet 25 mg, 25 mg, Oral, Daily, Core, Prentice BROCKS, MD   pantoprazole  (PROTONIX ) EC tablet 40 mg, 40 mg, Oral, Daily, Core, Prentice  C, MD   sodium chloride  flush (NS) 0.9 % injection 3 mL, 3 mL, Intravenous, Once, Core, Prentice BROCKS, MD   zolpidem  (AMBIEN ) tablet 2.5 mg, 2.5 mg, Oral, QHS PRN, Core, Prentice BROCKS, MD  Current Outpatient Medications:    amLODipine  (NORVASC ) 10 MG tablet, Take 10 mg by mouth daily., Disp: , Rfl:    aspirin  81 MG EC tablet, Take by mouth., Disp: , Rfl:    atorvastatin  (LIPITOR) 40 MG tablet, Take 1 tablet (40 mg total) by mouth daily at 6 PM., Disp:  , Rfl:    calcium -vitamin D  (OSCAL WITH D) 500-200 MG-UNIT tablet, Take 2 tablets by mouth daily., Disp: , Rfl:    celecoxib (CELEBREX) 200 MG capsule, Take 200 mg by mouth daily., Disp: , Rfl:    hydrOXYzine (ATARAX/VISTARIL) 25 MG tablet, Take 25 mg by mouth 3 (three) times daily as needed., Disp: , Rfl:    levothyroxine  (SYNTHROID ) 75 MCG tablet, Take 75 mcg by mouth daily before breakfast., Disp: , Rfl:    lisinopril  (ZESTRIL ) 40 MG tablet, Take 1 tablet by mouth daily., Disp: , Rfl:    metoprolol  tartrate (LOPRESSOR ) 100 MG tablet, Take 100 mg by mouth 2 (two) times daily., Disp: , Rfl:    mirabegron  ER (MYRBETRIQ ) 25 MG TB24 tablet, Take 1 tablet (25 mg total) by mouth daily., Disp: 28 tablet, Rfl: 0   Multiple Vitamin (MULTIVITAMIN WITH MINERALS) TABS tablet, Take 2 tablets by mouth daily., Disp: , Rfl:    omeprazole (PRILOSEC) 20 MG capsule, Take 20 mg by mouth daily as needed (heartburn)., Disp: , Rfl:    zolpidem  (AMBIEN ) 5 MG tablet, Take 2.5 mg by mouth at bedtime as needed for sleep., Disp: , Rfl:   Vitals   Vitals:   01/26/24 1348 01/26/24 1348  01/26/24 1417  BP: (!) 178/76  (!) 188/78  Pulse: (!) 58  62  Resp: 18  (!) 25  Temp: 98 F (36.7 C)    SpO2: 99%  100%  Weight:  63.5 kg   Height:  5' (1.524 m)     Body mass index is 27.34 kg/m.   Physical Exam   Constitutional: Appears well-developed and well-nourished.  Psych: Affect appropriate to situation.  Eyes: No scleral injection.  HENT: No OP obstruction.  Head: Normocephalic.  Respiratory: Effort normal, non-labored breathing.  Skin: WDI.   Neurologic Examination   Mental Status: Awake and alert. Fully oriented x 5. Thought content appropriate. Speech with mild dysfluency, occasional stuttering and pauses for word-finding. Occasional phonemic paraphasias are noted. She is able to fully describe the kitchen scene from the NIHSS card. She can name all of the items from the naming card, but had significant delay in recalling the words hammock and cactus. Comprehension intact with ability to follow all commands and demonstrate comprehension of all questions.  Cranial Nerves: II: Temporal visual fields intact with no extinction to DSS. PERRL. III,IV, VI: No ptosis. EOMI. No nystagmus. V: Temp sensation equal bilaterally VII: Mild right lower quadrant facial weakness that son states is her baseline.  VIII: Hearing intact to voice IX,X: No hypophonia or hoarseness XI: Symmetric XII: Midline tongue extension Motor: RUE: 5/5 LUE: 5/5 RLE: 5/5 LLE: 5/5 No drift Sensory: Temp and FT intact x 4. No extinction to DSS. Deep Tendon Reflexes: 2+ and symmetric bilateral biceps, brachioradialis and patellae. 1+ bilateral achilles.  Plantars: Right: downgoingLeft: downgoing Cerebellar: No ataxia with FNF bilaterally Gait: Deferred  Labs/Imaging/Neurodiagnostic studies   CBC:  Recent Labs  Lab 01/26/24 1346  WBC 5.7  NEUTROABS 3.1  HGB 13.8  HCT 40.0  MCV 92.8  PLT 252     ASSESSMENT  Nelma Phagan is an 83 y.o. female with a PMHx of stroke  (manifested as right sided weakness, right facial droop and dysphasia, essentially completely resolved per son), DM2, right invasive mammary carcinoma s/p lumpectomy and radiation treatment, HLD, HTN, thyroid  disease, osteopenia and vertigo  who presents with acute onset of dysphasia and dysarthria. - Exam reveals mild expressive dysphasia and dysarthria. Mild chronic right facial droop also noted. NIHSS 3.  - CT head: No acute intracranial abnormality. ASPECTS 10. Moderate to severe chronic microvascular ischemic changes. Remote infarct in the left basal ganglia. Likely small remote infarct in the left cerebellum.  - CTA of head and neck: No large vessel occlusion, hemodynamically significant stenosis, or aneurysm in the head or neck. Mild atherosclerosis of the visualized aortic arch, proximal right subclavian artery, and left subclavian artery origin without significant stenosis. Mild atherosclerosis at the carotid bifurcations, both slightly increased from prior, without hemodynamically significant stenosis. - Labs: Na low at 130, K and Ca normal. LFTs normal. BUN and Cr normal. CBC normal.  - Impression:  - Acute onset of dysarthria and dysphasia, most likely secondary to acute left MCA stroke with symptoms and signs best referable to the left frontal operculum.  - Discussed the risks and benefits of TNK versus no treatment with the patient and her son. Given her relatively mild symptoms, the son and patient decided not to proceed with TNK.   RECOMMENDATIONS  - Add Plavix  to ASA. Continue DAPT indefinitetly as she has failed ASA monotherapy.  - Continue atorvastatin . - MRI brain - TTE - Cardiac telemetry - HgbA1c, fasting lipid panel - PT consult, OT consult, Speech consult - Risk factor modification - Frequent neuro checks - NPO until passes stroke swallow screen - Permissive HTN x 24 hours ______________________________________________________________________    Bonney SHARK,  December Hedtke, MD [facility]

## 2024-01-26 NOTE — Assessment & Plan Note (Signed)
 Aortic atherosclerosis Incidentally found on the imaging continue high intensity statin outpatient follow-up

## 2024-01-26 NOTE — ED Provider Notes (Signed)
.-----------------------------------------   3:54 PM on 01/26/2024 -----------------------------------------  Blood pressure (!) 188/78, pulse 62, temperature 98 F (36.7 C), resp. rate (!) 25, height 5' (1.524 m), weight 63.5 kg, SpO2 100%.  Assuming care from Dr. Floy.  In short, Meghan Dougherty is a 83 y.o. female with a chief complaint of Code Stroke .  Refer to the original H&P for additional details.  The current plan of care is to follow-up CT angio, if no large vessel occlusion, will likely need to be admitted for stroke workup.  CT angio without large vessel occlusion, consulted hospitalist to admit the patient.  She is admitted.  Clinical Course as of 01/26/24 1606  Sun Jan 26, 2024  1558 CT ANGIO HEAD NECK W WO CM (CODE STROKE) IMPRESSION: 1. No large vessel occlusion, hemodynamically significant stenosis, or aneurysm in the head or neck. 2. Mild atherosclerosis of the visualized aortic arch, proximal right subclavian artery, and left subclavian artery origin without significant stenosis. 3. Mild atherosclerosis at the carotid bifurcations, both slightly increased from prior, without hemodynamically significant stenosis.   [TT]    Clinical Course User Index [TT] Waymond Lorelle Cummins, MD      Waymond Lorelle Cummins, MD 01/26/24 [id number]

## 2024-01-27 ENCOUNTER — Observation Stay: Admit: 2024-01-27 | Discharge: 2024-01-27 | Disposition: A | Attending: Internal Medicine

## 2024-01-27 DIAGNOSIS — E039 Hypothyroidism, unspecified: Secondary | ICD-10-CM

## 2024-01-27 DIAGNOSIS — G459 Transient cerebral ischemic attack, unspecified: Secondary | ICD-10-CM

## 2024-01-27 DIAGNOSIS — F419 Anxiety disorder, unspecified: Secondary | ICD-10-CM | POA: Diagnosis not present

## 2024-01-27 DIAGNOSIS — N3281 Overactive bladder: Secondary | ICD-10-CM

## 2024-01-27 DIAGNOSIS — I700 Atherosclerosis of aorta: Secondary | ICD-10-CM

## 2024-01-27 DIAGNOSIS — E871 Hypo-osmolality and hyponatremia: Secondary | ICD-10-CM

## 2024-01-27 DIAGNOSIS — I10 Essential (primary) hypertension: Secondary | ICD-10-CM

## 2024-01-27 LAB — COMPREHENSIVE METABOLIC PANEL WITH GFR
ALT: 20 U/L (ref 0–44)
AST: 30 U/L (ref 15–41)
Albumin: 3.6 g/dL (ref 3.5–5.0)
Alkaline Phosphatase: 74 U/L (ref 38–126)
Anion gap: 10 (ref 5–15)
BUN: 14 mg/dL (ref 8–23)
CO2: 23 mmol/L (ref 22–32)
Calcium: 8.6 mg/dL — ABNORMAL LOW (ref 8.9–10.3)
Chloride: 101 mmol/L (ref 98–111)
Creatinine, Ser: 0.71 mg/dL (ref 0.44–1.00)
GFR, Estimated: >60 mL/min (ref >60)
Glucose, Bld: 95 mg/dL (ref 70–99)
Potassium: 3.2 mmol/L — ABNORMAL LOW (ref 3.5–5.1)
Sodium: 134 mmol/L — ABNORMAL LOW (ref 135–145)
Total Bilirubin: 0.7 mg/dL (ref 0.0–1.2)
Total Protein: 6.4 g/dL — ABNORMAL LOW (ref 6.5–8.1)

## 2024-01-27 LAB — CBC
HCT: 37.6 % (ref 36.0–46.0)
Hemoglobin: 12.8 g/dL (ref 12.0–15.0)
MCH: 31.4 pg (ref 26.0–34.0)
MCHC: 34 g/dL (ref 30.0–36.0)
MCV: 92.4 fL (ref 80.0–100.0)
Platelets: 235 K/uL (ref 150–400)
RBC: 4.07 MIL/uL (ref 3.87–5.11)
RDW: 11.9 % (ref 11.5–15.5)
WBC: 5.8 K/uL (ref 4.0–10.5)
nRBC: 0 % (ref 0.0–0.2)

## 2024-01-27 LAB — ECHOCARDIOGRAM COMPLETE
AR max vel: 2.64 cm2
AV Area VTI: 2.84 cm2
AV Area mean vel: 2.61 cm2
AV Mean grad: 3 mmHg
AV Peak grad: 4.9 mmHg
Ao pk vel: 1.11 m/s
Area-P 1/2: 2.28 cm2
Height: 60 in
MV VTI: 2.12 cm2
S' Lateral: 2.4 cm
Weight: 2240 [oz_av]

## 2024-01-27 LAB — LIPID PANEL
Cholesterol: 129 mg/dL (ref 0–200)
HDL: 46 mg/dL (ref >40)
LDL Cholesterol: 54 mg/dL (ref 0–99)
Total CHOL/HDL Ratio: 2.8 ratio
Triglycerides: 143 mg/dL (ref <150)
VLDL: 29 mg/dL (ref 0–40)

## 2024-01-27 LAB — HEMOGLOBIN A1C
Hgb A1c MFr Bld: 5.6 % (ref 4.8–5.6)
Mean Plasma Glucose: 114.02 mg/dL

## 2024-01-27 LAB — CORTISOL-AM, BLOOD: Cortisol - AM: 16.3 ug/dL (ref 6.7–22.6)

## 2024-01-27 LAB — TSH: TSH: 0.742 u[IU]/mL (ref 0.350–4.500)

## 2024-01-27 MED ORDER — CLOPIDOGREL BISULFATE 75 MG PO TABS: 75.0000 mg | ORAL_TABLET | Freq: Every day | ORAL | 2 refills | Status: AC

## 2024-01-27 MED ORDER — POTASSIUM CHLORIDE 10 MEQ/100ML IV SOLN
10.0000 meq | INTRAVENOUS | Status: AC
  Administered 2024-01-27 (×4): 10 meq via INTRAVENOUS
  Filled 2024-01-27 (×4): qty 100

## 2024-01-27 NOTE — Evaluation (Signed)
 Clinical/Bedside Swallow Evaluation Patient Details  Name: Meghan Dougherty Date of Birth: [birth year]  Today's Date: 01/27/2024 Time: SLP Start Time (ACUTE ONLY): 0845 SLP Stop Time (ACUTE ONLY): 0910 SLP Time Calculation (min) (ACUTE ONLY): 25 min  Past Medical History:  Past Medical History:  Diagnosis Date   Borderline diabetes    Breast cancer, right (HCC) 11/2016   invasive mammary carcinoma, Lumpectomy and rad tx's.    Colon polyp    Diabetes mellitus without complication (HCC)    type 2   Hyperlipemia    Hypertension    Hyperthyroidism    Hypothyroidism    Insomnia    Osteopenia    Personal history of radiation therapy 2018   right breast cancer   Pneumonia    Stroke (HCC) 12/09/2018   right sided weakness, mild speech, unsteady gait     Vertigo    Past Surgical History:  Past Surgical History:  Procedure Laterality Date   ABDOMINAL HYSTERECTOMY     oophorectomy   APPENDECTOMY     BREAST BIOPSY Left    neg core   BREAST BIOPSY Right    neg  core   BREAST BIOPSY Right 11/2016   invasive mammary carcinoma, US    BREAST BIOPSY Right 11/2016   benign, stero   BREAST LUMPECTOMY Right 12/2016   invasive mammary carcinoma  Negative margins   CATARACT EXTRACTION W/ INTRAOCULAR LENS IMPLANT     CHOLECYSTECTOMY     COLONOSCOPY     COLONOSCOPY WITH PROPOFOL  N/A 01/15/2018   Procedure: COLONOSCOPY WITH PROPOFOL ;  Surgeon: Toledo, Ladell POUR, MD;  Location: [facility];  Service: Gastroenterology;  Laterality: N/A;   EYE SURGERY     bilateral cataracts   OOPHORECTOMY     PARTIAL MASTECTOMY WITH NEEDLE LOCALIZATION Right 01/04/2017   Procedure: PARTIAL MASTECTOMY WITH NEEDLE LOCALIZATION;  Surgeon: Claudene Larinda Bolder, MD;  Location: [facility];  Service: General;  Laterality: Right;   SENTINEL NODE BIOPSY Right 01/04/2017   Procedure: SENTINEL NODE BIOPSY;  Surgeon: Claudene Larinda Bolder, MD;  Location: [facility];  Service: General;  Laterality:  Right;   THYROID  SURGERY     total thyroidectomy   TONSILLECTOMY     TOTAL KNEE ARTHROPLASTY Left 07/23/2019   Procedure: TOTAL KNEE ARTHROPLASTY;  Surgeon: Edie Norleen PARAS, MD;  Location: [facility];  Service: Orthopedics;  Laterality: Left;   TOTAL THYROIDECTOMY     HPI:  83 year old female who presented with concerns for stroke d/t significant dysarthria and R sided facial droop, but no motor symptoms or further word finding difficulties. CT and MRI head negative. MD assessment: TIA and hyponatremia. PMH of hypothyroidism, hypertension, anxiety, overactive bladder, breast cancer, CVA in 12/2018. MRI  No acute intracranial abnormality.  2. Moderate-to-advanced chronic small vessel ischemic disease.  3. Remote left basal ganglia lacunar infarct with associated Wallerian degeneration.    Assessment / Plan / Recommendation  Clinical Impression  Pt seen for bedside swallow evaluation in the setting of TIA and failed nursing swallow screen. Pt alert and eager for assessment. Hx of dysphagia noted following CVA in 2020- MBSS in Sept 2020 revealing mild oral deficits and WFL pharyngeal function with recommendations for regular solids and thin liquids. Pt reports no issue with PO intake at baseline- min pill dysphagia with large pills requiring multiple sips of liquid for clearance. Education shared regarding options for medication administration- with pt reporting understanding. Pt with residual mild right facial weakness- noted on oral mech exam. Otherwise no acute  oral motor deficits. Pt seen with trials of thin liquids (via straw), puree, and regular solids. No overt or subtle s/sx pharyngeal dysphagia noted. No change to vocal quality across trials. Vitals stable for duration of trials (99-100 for O2 saturations). Oral phase grossly intact- with complete manipulation and clearance of regular solid from oral cavity despite missing bottom dentures.   Based on age and hx of CVA/dysphagia/GERD, pt is at  increased risk of aspiration, therefore recommend aspiration precautions (slow rate, small bites, elevated HOB, and alert for PO intake). Recommend unrestricted diet. MD and RN aware of recommendations.  Speech notable for min dysarthria (imprecise articulation) and intermittent irregular word productions. Motor speech negatively impacted by missing dentition. Pt/son reporting that speech is approximating baseline at this time. If cognitive linguistic concerns arrise- recommend follow up with PCP for OP speech therapy referral.     SLP Visit Diagnosis: Dysphagia, unspecified (R13.10) (baseline residual facial weakness - minimal oral dysphagia)    Aspiration Risk  Mild aspiration risk    Diet Recommendation   Thin;Age appropriate regular  Medication Administration: Whole meds with liquid    Other  Recommendations Oral Care Recommendations: Oral care BID;Patient independent with oral care     Assistance Recommended at Discharge  ModI for PO intake  Functional Status Assessment Patient has not had a recent decline in their functional status    Swallow Study   General Date of Onset: 01/27/24 HPI: 83 year old female who presented with concerns for stroke d/t significant dysarthria and R sided facial droop, but no motor symptoms or further word finding difficulties. CT and MRI head negative. MD assessment: TIA and hyponatremia. PMH of hypothyroidism, hypertension, anxiety, overactive bladder, breast cancer, CVA in 12/2018. MRI  No acute intracranial abnormality.  2. Moderate-to-advanced chronic small vessel ischemic disease.  3. Remote left basal ganglia lacunar infarct with associated Wallerian degeneration. Type of Study: Bedside Swallow Evaluation Previous Swallow Assessment: MBSS in 2020 with recommendation for reg/thin and mild oral deficits and WFL pharyngeal phase Diet Prior to this Study: NPO Temperature Spikes Noted: No Respiratory Status: Room air History of Recent Intubation:  No Behavior/Cognition: Alert;Cooperative;Pleasant mood Oral Cavity Assessment: Within Functional Limits Oral Care Completed by SLP: Recent completion by staff Oral Cavity - Dentition: Dentures, top (bottom dentures not present) Vision: Functional for self-feeding Self-Feeding Abilities: Able to feed self Patient Positioning: Upright in bed Baseline Vocal Quality: Normal Volitional Cough: Strong Volitional Swallow: Able to elicit    Oral/Motor/Sensory Function Overall Oral Motor/Sensory Function: Mild impairment Facial ROM: Reduced right (baseline) Facial Symmetry: Abnormal symmetry right Facial Strength: Reduced right Lingual ROM: Within Functional Limits Lingual Symmetry: Within Functional Limits Lingual Strength: Within Functional Limits Lingual Sensation: Within Functional Limits Velum: Within Functional Limits Mandible: Within Functional Limits   Ice Chips Ice chips: Not tested   Thin Liquid Thin Liquid: Within functional limits Presentation: Straw;Self Fed    Nectar Thick Nectar Thick Liquid: Not tested   Honey Thick Honey Thick Liquid: Not tested   Puree Puree: Within functional limits Presentation: Self Fed;Spoon   Solid     Solid: Within functional limits Presentation: Self Fed     [place] Ruwayda Curet Clapp, MS, CCC-SLP Speech Language Pathologist Rehab Services; [facility] [place] [phone] (ascom)   [place] J Clapp 01/27/2024,9:12 AM

## 2024-01-27 NOTE — Discharge Summary (Signed)
 DISCHARGE SUMMARY    Meghan Dougherty FMW:[id number] DOB: 1940-09-04 DOA: 01/26/2024  PCP: Alla Amis, MD  Admit date: 01/26/2024 Discharge date: 01/27/2024   Recommendations for Outpatient Follow-up:  Follow up with PCP in 1-2 weeks to review blood pressure log and make further blood pressure medication changes   Hospital Course: Meghan Dougherty [place] female with hypothyroidism, hypertension, anxiety, diabetes, overactive bladder, history of breast cancer, prior CVA in 2020 who presented with sudden onset dysarthria and dysphagia.  She was a code stroke on arrival, but due to mild symptoms she was not treated with TNK.  Initial head CT without acute intracranial abnormality.  CT head and neck without LVO.  Patient was admitted for MRI and stroke workup.  MRI does reveal remote basal ganglia infarct, nothing acute.  Echo WNL.  By 10/20 patient had returned to her mental status baseline.  She had no further dysarthria or dysphagia beyond her baseline.  She worked with PT/OT and was determined to have no skilled needs.  She was seen by neurology who recommended DAPT for 21 days and then proceeding with Plavix  monotherapy after that. Patient's blood pressure and pulse were too low to tolerate her many antihypertensives.  We recommend holding some of these at discharge with plans to follow-up closely with PCP for blood pressure monitoring and titration.  Care plan was discussed directly with the patient as well as with her son, Meghan Dougherty who was at bedside.  They endorsed understanding  Admitted for stroke workup - Acute CVA ruled out.  Likely TIA - Symptoms resolved - Continue DAPT then Plavix  monotherapy after that.  She has failed aspirin  monotherapy - Continue atorvastatin  40 mg - Echo WNL - Monitor on telemetry without events - TSH WNL - Hemoglobin A1c 5.6% - Neurology consulted.  Recommendations as above  Hypertension - Patient is on multiple antihypertensives at  home. - BP and pulse too low to tolerate these.  Discontinue most at discharge with plans to follow-up with PCP - Advised patient on blood pressure monitoring outpatient she will present a BP log to her PCP this week  Other chronic conditions: Hypothyroidism Anxiety Debilities Overactive bladder History of breast cancer Prior CVA - Resume all home meds  Discharge Instructions  Discharge Instructions     Call MD for:  difficulty breathing, headache or visual disturbances   Complete by: As directed    Call MD for:  persistant dizziness or light-headedness   Complete by: As directed    Call MD for:  persistant nausea and vomiting   Complete by: As directed    Call MD for:  severe uncontrolled pain   Complete by: As directed    Call MD for:  temperature >100.4   Complete by: As directed    Diet general   Complete by: As directed    Discharge instructions   Complete by: As directed    While admitted we made some changes to your blood pressure medications.  While you were here your blood pressure and your pulse were too low to tolerate your metoprolol  and lisinopril  as prescribed.  We recommend you hold these medications for now until you are able to monitor your blood pressure at home and follow-up with your PCP.   Please review your discharge medications closely to ensure you are taking the correct meds at home. Please take your blood pressure once a day and keep a log. See your primary care doctor in one week to review this log and make further medication  changes.   You have a new medication called Plavix /clopidogrel .  You will need to take this medication for the rest of your life.  Please take aspirin  as well for the 21 days and then proceed with Plavix  only.   Increase activity slowly   Complete by: As directed       Allergies as of 01/27/2024       Reactions   Levaquin [levofloxacin] Rash   Penicillins Rash   Has patient had a PCN reaction causing immediate rash,  facial/tongue/throat swelling, SOB or lightheadedness with hypotension: No Has patient had a PCN reaction causing severe rash involving mucus membranes or skin necrosis: No Has patient had a PCN reaction that required hospitalization: No Has patient had a PCN reaction occurring within the last 10 years: No If all of the above answers are NO, then may proceed with Cephalosporin use.   Trazodone  Other (See Comments)   Headache and nightmares        Medication List     PAUSE taking these medications    lisinopril  40 MG tablet Wait to take this until your doctor or other care provider tells you to start again. Commonly known as: ZESTRIL  Take 1 tablet by mouth daily.   metoprolol  tartrate 100 MG tablet Wait to take this until your doctor or other care provider tells you to start again. Commonly known as: LOPRESSOR  Take 100 mg by mouth 2 (two) times daily.       STOP taking these medications    azithromycin 250 MG tablet Commonly known as: ZITHROMAX   chlorthalidone 25 MG tablet Commonly known as: HYGROTON       TAKE these medications    amLODipine  10 MG tablet Commonly known as: NORVASC  Take 10 mg by mouth daily.   aspirin  EC 81 MG tablet Take 81 mg by mouth daily.   atorvastatin  40 MG tablet Commonly known as: LIPITOR Take 1 tablet (40 mg total) by mouth daily at 6 PM.   calcium -vitamin D  500-200 MG-UNIT tablet Commonly known as: OSCAL WITH D Take 2 tablets by mouth daily.   cetirizine 10 MG tablet Commonly known as: ZYRTEC Take 10 mg by mouth daily.   clopidogrel  75 MG tablet Commonly known as: PLAVIX  Take 1 tablet (75 mg total) by mouth daily. Start taking on: January 28, 2024   levothyroxine  75 MCG tablet Commonly known as: SYNTHROID  Take 75 mcg by mouth daily before breakfast.   multivitamin with minerals Tabs tablet Take 2 tablets by mouth daily.   omeprazole 20 MG capsule Commonly known as: PRILOSEC Take 20 mg by mouth daily as needed  (heartburn).   PARoxetine  10 MG tablet Commonly known as: PAXIL  Take 10 mg by mouth daily.   zolpidem  5 MG tablet Commonly known as: AMBIEN  Take 2.5 mg by mouth at bedtime as needed for sleep.        Follow-up Information     Alla Amis, MD Follow up.   Specialty: Family Medicine Why: hospital follow up Contact information: [address] [facility] [place] [address] [phone]                Allergies  Allergen Reactions   Levaquin [Levofloxacin] Rash   Penicillins Rash    Has patient had a PCN reaction causing immediate rash, facial/tongue/throat swelling, SOB or lightheadedness with hypotension: No Has patient had a PCN reaction causing severe rash involving mucus membranes or skin necrosis: No Has patient had a PCN reaction that required hospitalization:  No Has patient had a PCN reaction occurring within the last 10 years: No If all of the above answers are NO, then may proceed with Cephalosporin use.    Trazodone  Other (See Comments)    Headache and nightmares    Consultations:    Procedures/Studies: ECHOCARDIOGRAM COMPLETE Result Date: 01/27/2024    ECHOCARDIOGRAM REPORT   Patient Name:   Meghan Dougherty Date of Exam: 01/27/2024 Medical Rec #:  [id number]           Height:       60.0 in Accession #:    [id number]          Weight:       140.0 lb Date of Birth:  Sep 19, 1940          BSA:          1.604 m Patient Age:    83 years            BP:           119/76 mmHg Patient Gender: F                   HR:           54 bpm. Exam Location:  [facility] Procedure: 2D Echo, Cardiac Doppler, Color Doppler and Saline Contrast Bubble            Study (Both Spectral and Color Flow Doppler were utilized during            procedure). Indications:     TIA G45.9  History:         Patient has prior history of Echocardiogram examinations, most                  recent 12/10/2018. Risk Factors:Hypertension and Diabetes.  Sonographer:     Christopher Furnace  Referring Phys:  [id number] PRENTICE BROCKS CORE Diagnosing Phys: Deatrice Cage MD IMPRESSIONS  1. Left ventricular ejection fraction, by estimation, is 55 to 60%. The left ventricle has normal function. The left ventricle has no regional wall motion abnormalities. Left ventricular diastolic parameters were normal.  2. Right ventricular systolic function is normal. The right ventricular size is normal. There is normal pulmonary artery systolic pressure.  3. The mitral valve is normal in structure. No evidence of mitral valve regurgitation. No evidence of mitral stenosis.  4. The aortic valve is normal in structure. Aortic valve regurgitation is not visualized. Aortic valve sclerosis/calcification is present, without any evidence of aortic stenosis.  5. The inferior vena cava is normal in size with greater than 50% respiratory variability, suggesting right atrial pressure of 3 mmHg.  6. Agitated saline contrast bubble study was negative, with no evidence of any interatrial shunt. FINDINGS  Left Ventricle: Left ventricular ejection fraction, by estimation, is 55 to 60%. The left ventricle has normal function. The left ventricle has no regional wall motion abnormalities. The left ventricular internal cavity size was normal in size. There is  borderline left ventricular hypertrophy. Left ventricular diastolic parameters were normal. Right Ventricle: The right ventricular size is normal. No increase in right ventricular wall thickness. Right ventricular systolic function is normal. There is normal pulmonary artery systolic pressure. The tricuspid regurgitant velocity is 2.71 m/s, and  with an assumed right atrial pressure of 3 mmHg, the estimated right ventricular systolic pressure is 32.4 mmHg. Left Atrium: Left atrial size was normal in size. Right Atrium: Right atrial size was normal in size. Pericardium: Trivial pericardial effusion is present. Mitral  Valve: The mitral valve is normal in structure. No evidence of mitral valve  regurgitation. No evidence of mitral valve stenosis. MV peak gradient, 4.8 mmHg. The mean mitral valve gradient is 2.0 mmHg. Tricuspid Valve: The tricuspid valve is normal in structure. Tricuspid valve regurgitation is trivial. No evidence of tricuspid stenosis. Aortic Valve: The aortic valve is normal in structure. Aortic valve regurgitation is not visualized. Aortic valve sclerosis/calcification is present, without any evidence of aortic stenosis. Aortic valve mean gradient measures 3.0 mmHg. Aortic valve peak  gradient measures 4.9 mmHg. Aortic valve area, by VTI measures 2.84 cm. Pulmonic Valve: The pulmonic valve was normal in structure. Pulmonic valve regurgitation is trivial. No evidence of pulmonic stenosis. Aorta: The aortic root is normal in size and structure. Venous: The inferior vena cava is normal in size with greater than 50% respiratory variability, suggesting right atrial pressure of 3 mmHg. IAS/Shunts: No atrial level shunt detected by color flow Doppler. Agitated saline contrast was given intravenously to evaluate for intracardiac shunting. Agitated saline contrast bubble study was negative, with no evidence of any interatrial shunt.  LEFT VENTRICLE PLAX 2D LVIDd:         3.80 cm   Diastology LVIDs:         2.40 cm   LV e' medial:    6.31 cm/s LV PW:         1.20 cm   LV E/e' medial:  12.1 LV IVS:        1.00 cm   LV e' lateral:   9.25 cm/s LVOT diam:     2.00 cm   LV E/e' lateral: 8.3 LV SV:         78 LV SV Index:   49 LVOT Area:     3.14 cm  RIGHT VENTRICLE RV Basal diam:  3.60 cm RV Mid diam:    3.40 cm RV S prime:     12.00 cm/s TAPSE (M-mode): 2.3 cm LEFT ATRIUM             Index        RIGHT ATRIUM           Index LA diam:        2.40 cm 1.50 cm/m   RA Area:     16.00 cm LA Vol (A2C):   29.8 ml 18.58 ml/m  RA Volume:   42.70 ml  26.62 ml/m LA Vol (A4C):   32.5 ml 20.26 ml/m LA Biplane Vol: 32.2 ml 20.07 ml/m  AORTIC VALVE AV Area (Vmax):    2.64 cm AV Area (Vmean):   2.61 cm AV  Area (VTI):     2.84 cm AV Vmax:           111.00 cm/s AV Vmean:          73.800 cm/s AV VTI:            0.275 m AV Peak Grad:      4.9 mmHg AV Mean Grad:      3.0 mmHg LVOT Vmax:         93.30 cm/s LVOT Vmean:        61.300 cm/s LVOT VTI:          0.249 m LVOT/AV VTI ratio: 0.91  AORTA Ao Root diam: 3.20 cm MITRAL VALVE               TRICUSPID VALVE MV Area (PHT): 2.28 cm    TR Peak grad:   29.4 mmHg MV  Area VTI:   2.12 cm    TR Vmax:        271.00 cm/s MV Peak grad:  4.8 mmHg MV Mean grad:  2.0 mmHg    SHUNTS MV Vmax:       1.09 m/s    Systemic VTI:  0.25 m MV Vmean:      63.9 cm/s   Systemic Diam: 2.00 cm MV Decel Time: 333 msec MV E velocity: 76.50 cm/s MV A velocity: 85.90 cm/s MV E/A ratio:  0.89 Deatrice Cage MD Electronically signed by Deatrice Cage MD Signature Date/Time: 01/27/2024/10:14:11 AM    Final    MR BRAIN WO CONTRAST Result Date: 01/26/2024 EXAM: MRI BRAIN WITHOUT CONTRAST 01/26/2024 06:38:00 PM TECHNIQUE: Multiplanar multisequence MRI of the head/brain was performed without the administration of intravenous contrast. COMPARISON: Comparison is made with prior CTs from earlier the same day. CLINICAL HISTORY: Stroke suspected (Ped 0-17y). 83 y.o. female who presents to the emergency department today because of concerns for slurred speech. Started about an hour prior to evaluation. Patient denies any extremity weakness. At the time my exam she does feel like it is improving. She denies any recent trauma to her head. Denies any recent illness. FINDINGS: BRAIN AND VENTRICLES: No acute infarct. No intracranial hemorrhage. No mass. No midline shift. No hydrocephalus. Patchy and confluent T2/FLAIR hyperintensity involving the periventricular and deep white matter of both cerebral hemispheres, consistent with chronic small vessel ischemic disease, moderate-to-advanced in nature. Remote lacunar infarct at the left basal ganglia with associated wallerian degeneration. The sella is unremarkable.  Normal flow voids. ORBITS: Prior bilateral ocular lens replacement. SINUSES AND MASTOIDS: No acute abnormality. BONES AND SOFT TISSUES: Normal marrow signal. No acute soft tissue abnormality. IMPRESSION: 1. No acute intracranial abnormality. 2. Moderate-to-advanced chronic small vessel ischemic disease. 3. Remote left basal ganglia lacunar infarct with associated Wallerian degeneration. Electronically signed by: Morene Hoard MD 01/26/2024 07:32 PM EDT RP Workstation: [id number]   DG Chest Port 1 View Result Date: 01/26/2024 CLINICAL DATA:  Acute stroke. EXAM: PORTABLE CHEST 1 VIEW COMPARISON:  12/10/2018 FINDINGS: The heart size and mediastinal contours are within normal limits. Both lungs are clear. IMPRESSION: No active disease. Electronically Signed   By: Norleen DELENA Kil M.D.   On: 01/26/2024 17:06   CT ANGIO HEAD NECK W WO CM (CODE STROKE) Result Date: 01/26/2024 EXAM: CTA HEAD AND NECK WITH AND WITHOUT 01/26/2024 03:32:59 PM TECHNIQUE: CTA of the head and neck was performed with and without the administration of 75 mL iohexol  (OMNIPAQUE ) 350 mg/mL injection 100 mL IOHEXOL  350 MG/ML SOLN intravenous contrast. Multiplanar 2D and/or 3D reformatted images are provided for review. Automated exposure control, iterative reconstruction, and/or weight based adjustment of the mA/kV was utilized to reduce the radiation dose to as low as reasonably achievable. Stenosis of the internal carotid arteries measured using NASCET criteria. COMPARISON: CT head and CTA head 12/2018 CLINICAL HISTORY: Neuro deficit, acute, stroke suspected. Headache yesterday; today was having speech difficulties; prior hx of stroke 7 years ago; hx of thyroid  surg hx of rt breast ca FINDINGS: CTA NECK: AORTIC ARCH AND ARCH VESSELS: Mild atherosclerosis of the visualized aortic arch. Mild atherosclerosis of the proximal right subclavian artery without significant stenosis. Slightly increased atherosclerosis at the left subclavian artery  origin without significant stenosis. No dissection or arterial injury. CERVICAL CAROTID ARTERIES: The right carotid artery is patent from the origin to the skull base. Mild atherosclerosis at the right carotid bifurcation appears slightly increased from prior without hemodynamically  significant stenosis. The left carotid artery is patent from the origin to the skull base. Calcified atherosclerosis at the left carotid bifurcation is slightly increased from prior with no hemodynamically significant stenosis. No dissection or arterial injury. CERVICAL VERTEBRAL ARTERIES: The vertebral arteries are patent from the origins to the vertebrobasilar confluence. The right vertebral artery is dominant. Mild tortuosity of the bilateral V1 segments. No dissection, arterial injury, or significant stenosis. LUNGS AND MEDIASTINUM: Chronic scarring in the right lung apex. SOFT TISSUES: Edentulous mandible. Multiple chronically absent maxillary teeth. No acute abnormality. BONES: Degenerative changes throughout the visualized spine. Similar chronic irregularity of the C4 superior endplate. No acute abnormality. CTA HEAD: ANTERIOR CIRCULATION: The intracranial internal carotid arteries are patent bilaterally with mild atherosclerosis of the carotid siphons without significant stenosis. The middle cerebral arteries are patent bilaterally. The anterior cerebral arteries are patent bilaterally. No aneurysm. POSTERIOR CIRCULATION: Initial demonstrated appearance of the proximal basilar artery. The basilar artery is patent. The posterior cerebral arteries are patent bilaterally. The superior cerebellar arteries are patent bilaterally. AICA visualized bilaterally. Dominant PICA on the left. No significant stenosis of the vertebral arteries. No aneurysm. OTHER: No dural venous sinus thrombosis on this non-dedicated study. IMPRESSION: 1. No large vessel occlusion, hemodynamically significant stenosis, or aneurysm in the head or neck. 2. Mild  atherosclerosis of the visualized aortic arch, proximal right subclavian artery, and left subclavian artery origin without significant stenosis. 3. Mild atherosclerosis at the carotid bifurcations, both slightly increased from prior, without hemodynamically significant stenosis. Electronically signed by: Donnice Mania MD 01/26/2024 03:55 PM EDT RP Workstation: [id number]   CT HEAD CODE STROKE WO CONTRAST Result Date: 01/26/2024 EXAM: CT HEAD WITHOUT CONTRAST 01/26/2024 01:48:58 PM TECHNIQUE: CT of the head was performed without the administration of intravenous contrast. Automated exposure control, iterative reconstruction, and/or weight based adjustment of the mA/kV was utilized to reduce the radiation dose to as low as reasonably achievable. COMPARISON: CT head 08/16/2023 and MRI head 07/27/2019. CLINICAL HISTORY: Neuro deficit, acute, stroke suspected. LWK 1200; Left side facial droop, slurred speech; Neuro MD Merrianne [phone]. FINDINGS: BRAIN AND VENTRICLES: No acute hemorrhage. No evidence of acute infarct. Nonspecific hypoattenuation in the periventricular and subcortical white matter, most likely representing chronic microvascular ischemic changes. Demonstrated remote infarct in the left basal ganglia. Similar asymmetry of chronic white matter changes in the left corona radiata. There is likely a small remote infarct in the left cerebellum. No hydrocephalus. No extra-axial collection. No mass effect or midline shift. [place] stroke program early CT (aspect) score: Ganglionic (caudate, ic, lentiform nucleus, insula, M1-m3): 7 Supraganglionic (m4-m6): 3 Total: 10 ORBITS: Bilateral lens replacement. No acute abnormality. SINUSES: No acute abnormality. SOFT TISSUES AND SKULL: No acute soft tissue abnormality. Prominent arachnoid granulation in the occipital bone right of midline. No acute osseous abnormality. Atherosclerosis of the carotid siphons. IMPRESSION: 1. No acute intracranial abnormality. 2. ASPECTS  10. 3. Moderate to severe chronic microvascular ischemic changes. 4. Remote infarct in the left basal ganglia. 5. Likely small remote infarct in the left cerebellum. 6. Findings messaged to Dr. Lindzen at 1:56PM on 01/26/24. Electronically signed by: Donnice Mania MD 01/26/2024 01:57 PM EDT RP Workstation: [id number]      Discharge Exam: Vitals:   01/27/24 0722 01/27/24 1229  BP: (!) 146/66 (!) 136/58  Pulse: (!) 58 (!) 58  Resp:  16  Temp: 97.9 F (36.6 C) 97.8 F (36.6 C)  SpO2: 100% 99%   Vitals:   01/26/24 2333 01/27/24 0322 01/27/24 0722 01/27/24 1229  BP: (!) 128/102 119/76 (!) 146/66 (!) 136/58  Pulse: (!) 57 (!) 54 (!) 58 (!) 58  Resp:    16  Temp: 98.4 F (36.9 C) 97.9 F (36.6 C) 97.9 F (36.6 C) 97.8 F (36.6 C)  TempSrc: Oral Oral Oral   SpO2: 94% 97% 100% 99%  Weight:      Height:        Constitutional:  Normal appearance. Non toxic-appearing.  HENT: Head Normocephalic and atraumatic.  Mucous membranes are moist.  Eyes:  Extraocular intact. Conjunctivae normal.  Cardiovascular: Rate and Rhythm: Normal rate and regular rhythm.  Pulmonary: Non labored, symmetric rise of chest wall.  Skin: warm and dry. not jaundiced.  Neurological: No focal deficit present. alert. Oriented.  Psychiatric: Mood and Affect congruent.    The results of significant diagnostics from this hospitalization (including imaging, microbiology, ancillary and laboratory) are listed below for reference.     Microbiology: No results found for this or any previous visit (from the past 240 hours).   Labs: BNP (last 3 results) No results for input(s): BNP in the last 8760 hours. Basic Metabolic Panel: Recent Labs  Lab 01/26/24 1423 01/26/24 2242 01/27/24 0448  NA 130*  --  134*  K 3.6  --  3.2*  CL 95*  --  101  CO2 22  --  23  GLUCOSE 103*  --  95  BUN 19  --  14  CREATININE 0.84 0.94 0.71  CALCIUM  9.1  --  8.6*   Liver Function Tests: Recent Labs  Lab 01/26/24 1423  01/27/24 0448  AST 30 30  ALT 22 20  ALKPHOS 81 74  BILITOT 0.8 0.7  PROT 6.9 6.4*  ALBUMIN 3.9 3.6   No results for input(s): LIPASE, AMYLASE in the last 168 hours. No results for input(s): AMMONIA in the last 168 hours. CBC: Recent Labs  Lab 01/26/24 1346 01/26/24 2242 01/27/24 0448  WBC 5.7 6.9 5.8  NEUTROABS 3.1  --   --   HGB 13.8 13.5 12.8  HCT 40.0 37.8 37.6  MCV 92.8 89.8 92.4  PLT 252 253 235   Cardiac Enzymes: No results for input(s): CKTOTAL, CKMB, CKMBINDEX, TROPONINI in the last 168 hours. BNP: Invalid input(s): POCBNP CBG: Recent Labs  Lab 01/26/24 1339  GLUCAP 130*   D-Dimer No results for input(s): DDIMER in the last 72 hours. Hgb A1c Recent Labs    01/26/24 2242  HGBA1C 5.6   Lipid Profile Recent Labs    01/27/24 0448  CHOL 129  HDL 46  LDLCALC 54  TRIG 143  CHOLHDL 2.8   Thyroid  function studies Recent Labs    01/27/24 0448  TSH 0.742   Anemia work up No results for input(s): VITAMINB12, FOLATE, FERRITIN, TIBC, IRON, RETICCTPCT in the last 72 hours. Urinalysis    Component Value Date/Time   COLORURINE YELLOW (A) 07/14/2019 1101   APPEARANCEUR Clear 12/18/2021 1313   LABSPEC 1.017 07/14/2019 1101   PHURINE 7.0 07/14/2019 1101   GLUCOSEU Negative 12/18/2021 1313   HGBUR NEGATIVE 07/14/2019 1101   BILIRUBINUR Negative 12/18/2021 1313   KETONESUR NEGATIVE 07/14/2019 1101   PROTEINUR Negative 12/18/2021 1313   PROTEINUR NEGATIVE 07/14/2019 1101   NITRITE Negative 12/18/2021 1313   NITRITE NEGATIVE 07/14/2019 1101   LEUKOCYTESUR Negative 12/18/2021 1313   LEUKOCYTESUR TRACE (A) 07/14/2019 1101   Sepsis Labs Recent Labs  Lab 01/26/24 1346 01/26/24 2242 01/27/24 0448  WBC 5.7 6.9 5.8   Microbiology No results found for  this or any previous visit (from the past 240 hours).   Time coordinating discharge: 32 min   SIGNED: Copper Kirtley, DO [facility] 01/27/2024, 2:09 PM Pager    If 7PM-7AM, please contact night-coverage

## 2024-01-27 NOTE — Plan of Care (Signed)
 Neurology plan of care  Meghan Dougherty is a 83 y.o. 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  Her symptoms were mild therefore she was not treated with TNK.  Per therapy notes today her symptoms have resolved and she is now back to baseline.  Interval workup:  CTA H&N  1. No large vessel occlusion, hemodynamically significant stenosis, or aneurysm in the head or neck. 2. Mild atherosclerosis of the visualized aortic arch, proximal right subclavian artery, and left subclavian artery origin without significant stenosis. 3. Mild atherosclerosis at the carotid bifurcations, both slightly increased from prior, without hemodynamically significant stenosis.  MRI brain showed no acute infarct, moderate-to-advanced CSVID, remote L BG infarct, personal review  TTE  1. Left ventricular ejection fraction, by estimation, is 55 to 60%. The  left ventricle has normal function. The left ventricle has no regional  wall motion abnormalities. Left ventricular diastolic parameters were  normal.   2. Right ventricular systolic function is normal. The right ventricular  size is normal. There is normal pulmonary artery systolic pressure.   3. The mitral valve is normal in structure. No evidence of mitral valve  regurgitation. No evidence of mitral stenosis.   4. The aortic valve is normal in structure. Aortic valve regurgitation is  not visualized. Aortic valve sclerosis/calcification is present, without  any evidence of aortic stenosis.   5. The inferior vena cava is normal in size with greater than 50%  respiratory variability, suggesting right atrial pressure of 3 mmHg.   6. Agitated  saline contrast bubble study was negative, with no evidence  of any interatrial shunt.   Stroke Labs     Component Value Date/Time   CHOL 129 01/27/2024 0448   TRIG 143 01/27/2024 0448   HDL 46 01/27/2024 0448   CHOLHDL 2.8 01/27/2024 0448   VLDL 29 01/27/2024 0448   LDLCALC 54 01/27/2024 0448    Lab Results  Component Value Date/Time   HGBA1C 5.6 01/26/2024 10:42 PM   A/P: Etiology of transient dysarthria and facial droop favored to be 2/2 TIA.  Final recommendations: - ASA 81mg  daily + plavix  75mg  daily x21 days f/b plavix  75mg  daily monotherapy after that - Continue home atorvastatin  40mg  daily - Inpatient TIA workup completed. Neurology to sign off, please reach out if we can be of any further assistance  Elida Ross, MD [facility] [phone]  If 7pm- 7am, please page neurology on call as listed in [facility].

## 2024-01-27 NOTE — Progress Notes (Signed)
 Brief Assessment Note  Medical record reviewed and patient has no TOC needs at this time. Please outreach to [facility] if needs are identified.

## 2024-01-27 NOTE — Evaluation (Addendum)
 Occupational Therapy Evaluation Patient Details Name: Meghan Dougherty MRN: [id number] DOB: [birth year] Today's Date: 01/27/2024   History of Present Illness   83 year old female who presented with concerns for stroke d/t significant dysarthria and R sided facial droop, but no motor symptoms or further word finding difficulties. CT and MRI head negative. MD assessment: TIA and hyponatremia. PMH of hypothyroidism, hypertension, anxiety, overactive bladder, breast cancer, CVA in 12/2018     Clinical Impressions Pt was seen for OT evaluation this date. PTA, pt resides at home alone with 2 STE and bil HR. At baseline, she is IND with all ADLs, IADLs and mobility without AD use. Reports she drives, goes to MD appts, grocery store, etc. [place] and has been staying with her 4 nights a week as well recently.  Pt presents with no acute deficits and reports she is back to her baseline function. Denies any weakness other than her normal L knee weakness s/p TKA in 2021. Pt demo IND with bed mobility, STS and ambulation 200 feet without AD use with supervision/IV pole management and no LOB. Her speech appears back to normal and she is alert and oriented x4. Son present and agrees pt is back at her baseline. Edu on use of DME during showering to maximize safety as needed and fall prevention strategies. Pt and son verbalized understanding. No further acute OT needs therefore will sign off in house with no follow up therapy indicated.      If plan is discharge home, recommend the following:         Functional Status Assessment   Patient has not had a recent decline in their functional status     Equipment Recommendations   None recommended by OT     Recommendations for Other Services         Precautions/Restrictions   Precautions Precautions: None Recall of Precautions/Restrictions: Intact Restrictions Weight Bearing Restrictions Per  Provider Order: No     Mobility Bed Mobility Overal bed mobility: Independent                  Transfers Overall transfer level: Independent                 General transfer comment: no AD utilized during session to stand and ambulate around nurses station and perform stair training      Balance Overall balance assessment: Modified Independent                                         ADL either performed or assessed with clinical judgement   ADL Overall ADL's : Independent                                             Vision Patient Visual Report: No change from baseline       Perception         Praxis         Pertinent Vitals/Pain Pain Assessment Pain Assessment: No/denies pain     Extremity/Trunk Assessment Upper Extremity Assessment Upper Extremity Assessment: Overall WFL for tasks assessed   Lower Extremity Assessment Lower Extremity Assessment: Overall WFL for tasks assessed;LLE deficits/detail LLE Deficits / Details: L knee replacement in 2021 and  reports mild issues ever since       Communication Communication Communication: No apparent difficulties   Cognition Arousal: Alert Behavior During Therapy: WFL for tasks assessed/performed Cognition: No apparent impairments                               Following commands: Intact       Cueing  General Comments          Exercises Other Exercises Other Exercises: Edu on role of OT in acute setting and use of DME at home as needed for showering safety.   Shoulder Instructions      Home Living Family/patient expects to be discharged to:: Private residence Living Arrangements: Alone Available Help at Discharge: Family;Available PRN/intermittently (son stays with her 4 days a week and lives at the beach the other days) Type of Home: House Home Access: Stairs to enter [employer] of Steps: 2 STE with bil HR Entrance  Stairs-Rails: Left;Right;Can reach both Home Layout: One level     Bathroom Shower/Tub: Producer, television/film/video: Standard     Home Equipment: Information systems manager - built in          Prior Functioning/Environment Prior Level of Function : Independent/Modified Independent;Driving             Mobility Comments: no AD use, IND, walks household and community distances into MD office, through grocery store pushing cart ADLs Comments: IND with ADLs, IADLs    OT Problem List:     OT Treatment/Interventions:        OT Goals(Current goals can be found in the care plan section)       OT Frequency:       Co-evaluation              AM-PAC OT 6 Clicks Daily Activity     Outcome Measure Help from another person eating meals?: None Help from another person taking care of personal grooming?: None Help from another person toileting, which includes using toliet, bedpan, or urinal?: None Help from another person bathing (including washing, rinsing, drying)?: None Help from another person to put on and taking off regular upper body clothing?: None Help from another person to put on and taking off regular lower body clothing?: None 6 Click Score: 24   End of Session Nurse Communication: Mobility status  Activity Tolerance: Patient tolerated treatment well Patient left: in bed;with call bell/phone within reach;with family/visitor present  OT Visit Diagnosis: Other abnormalities of gait and mobility (R26.89)                Time: 9192-9175 OT Time Calculation (min): 17 min Charges:  OT General Charges $OT Visit: 1 Visit OT Evaluation $OT Eval Low Complexity: 1 Low Odyssey Vasbinder, OTR/L  01/27/24, 8:34 AM  Duwaine FORBES Saupe 01/27/2024, 8:31 AM

## 2024-01-27 NOTE — Progress Notes (Signed)
*  PRELIMINARY RESULTS* Echocardiogram 2D Echocardiogram has been performed.  Meghan Dougherty 01/27/2024, 7:52 AM

## 2024-01-27 NOTE — Plan of Care (Signed)
  Problem: Self-Care: Goal: Ability to participate in self-care as condition permits will improve Outcome: Progressing   Problem: Education: Goal: Knowledge of General Education information will improve Description: Including pain rating scale, medication(s)/side effects and non-pharmacologic comfort measures Outcome: Progressing   Problem: Activity: Goal: Risk for activity intolerance will decrease Outcome: Progressing   Problem: Safety: Goal: Ability to remain free from injury will improve Outcome: Progressing

## 2024-01-27 NOTE — Progress Notes (Signed)
 PT Cancellation Note  Patient Details Name: Meghan Dougherty MRN: [id number] DOB: [birth year]   Cancelled Treatment:    Reason Eval/Treat Not Completed: PT screened, no needs identified, will sign off (Consult received and chart reviewed.  Per OT, patient symptoms resolved and patient is at baseline level of functional ability (verified by patient and son); mobilizing in room, around unit (including gait and stairs) indep without assist device.  No deficits, no safety needs identified.  Will complete order at this time; please reconsult should needs change.)  [name] H. Delores, PT, DPT, NCS 01/27/24, 10:20 AM [phone]

## 2024-01-28 ENCOUNTER — Other Ambulatory Visit: Payer: Self-pay

## 2024-01-28 ENCOUNTER — Emergency Department

## 2024-01-28 ENCOUNTER — Encounter: Payer: Self-pay | Admitting: Emergency Medicine

## 2024-01-28 ENCOUNTER — Emergency Department: Admission: EM | Admit: 2024-01-28 | Discharge: 2024-01-28 | Disposition: A | Discharge status: Home / Self Care

## 2024-01-28 DIAGNOSIS — H9311 Tinnitus, right ear: Secondary | ICD-10-CM | POA: Insufficient documentation

## 2024-01-28 DIAGNOSIS — E871 Hypo-osmolality and hyponatremia: Secondary | ICD-10-CM | POA: Insufficient documentation

## 2024-01-28 DIAGNOSIS — E039 Hypothyroidism, unspecified: Secondary | ICD-10-CM | POA: Diagnosis not present

## 2024-01-28 DIAGNOSIS — N3000 Acute cystitis without hematuria: Secondary | ICD-10-CM | POA: Insufficient documentation

## 2024-01-28 DIAGNOSIS — I10 Essential (primary) hypertension: Secondary | ICD-10-CM | POA: Diagnosis not present

## 2024-01-28 DIAGNOSIS — R519 Headache, unspecified: Secondary | ICD-10-CM | POA: Diagnosis present

## 2024-01-28 LAB — URINALYSIS, ROUTINE W REFLEX MICROSCOPIC
Bacteria, UA: NONE SEEN
Bilirubin Urine: NEGATIVE
Glucose, UA: NEGATIVE mg/dL
Hgb urine dipstick: NEGATIVE
Ketones, ur: 20 mg/dL — AB
Nitrite: NEGATIVE
Protein, ur: NEGATIVE mg/dL
Specific Gravity, Urine: 1.018 (ref 1.005–1.030)
pH: 6 (ref 5.0–8.0)

## 2024-01-28 LAB — COMPREHENSIVE METABOLIC PANEL WITH GFR
ALT: 21 U/L (ref 0–44)
AST: 31 U/L (ref 15–41)
Albumin: 4.1 g/dL (ref 3.5–5.0)
Alkaline Phosphatase: 83 U/L (ref 38–126)
Anion gap: 13 (ref 5–15)
BUN: 12 mg/dL (ref 8–23)
CO2: 19 mmol/L — ABNORMAL LOW (ref 22–32)
Calcium: 9.2 mg/dL (ref 8.9–10.3)
Chloride: 98 mmol/L (ref 98–111)
Creatinine, Ser: 0.7 mg/dL (ref 0.44–1.00)
GFR, Estimated: >60 mL/min (ref >60)
Glucose, Bld: 107 mg/dL — ABNORMAL HIGH (ref 70–99)
Potassium: 3.5 mmol/L (ref 3.5–5.1)
Sodium: 130 mmol/L — ABNORMAL LOW (ref 135–145)
Total Bilirubin: 0.6 mg/dL (ref 0.0–1.2)
Total Protein: 7.2 g/dL (ref 6.5–8.1)

## 2024-01-28 LAB — CBC
HCT: 38.7 % (ref 36.0–46.0)
Hemoglobin: 13.7 g/dL (ref 12.0–15.0)
MCH: 31.9 pg (ref 26.0–34.0)
MCHC: 35.4 g/dL (ref 30.0–36.0)
MCV: 90.2 fL (ref 80.0–100.0)
Platelets: 263 K/uL (ref 150–400)
RBC: 4.29 MIL/uL (ref 3.87–5.11)
RDW: 11.9 % (ref 11.5–15.5)
WBC: 7.2 K/uL (ref 4.0–10.5)
nRBC: 0 % (ref 0.0–0.2)

## 2024-01-28 MED ORDER — CEPHALEXIN 500 MG PO CAPS
500.0000 mg | ORAL_CAPSULE | Freq: Once | ORAL | Status: AC
  Administered 2024-01-28: 500 mg via ORAL
  Filled 2024-01-28: qty 1

## 2024-01-28 MED ORDER — CEPHALEXIN 500 MG PO CAPS: 500.0000 mg | ORAL_CAPSULE | Freq: Two times a day (BID) | ORAL | 0 refills | Status: AC

## 2024-01-28 NOTE — Discharge Instructions (Signed)
 Your evaluation in the emergency department was overall reassuring.  We did see evidence of urinary tract infection however, and have started you on antibiotic to treat this--please take the full course as prescribed.  As discussed, your blood pressure was elevated today, and this may be contributing to your symptoms.  I recommend restarting your lisinopril  given your elevated blood pressures here.  Continue to hold your metoprolol .  Please follow-up with your primary care provider as planned, and return to the emergency department with any new or worsening symptoms.

## 2024-01-28 NOTE — ED Triage Notes (Signed)
 Patient to ED via POV for pounding in her head. Denies pain states it sounds like someone is pounding in my head. Dc'd yesterday for a stroke. Denies new weakness or numbness. Speech clear. Reports she was taken off two blood pressure meds during admission due to hypotension. BP 181/81 in triage.  Pt discussed with Dicky, MD and given orders.

## 2024-01-28 NOTE — ED Notes (Signed)
 Pt discharged to home, instructions and medications reviewed.  Pt verbalized understanding, no questions at this time.

## 2024-01-28 NOTE — ED Provider Notes (Signed)
 [facility] Provider Note    Event Date/Time   First MD Initiated Contact with Patient 01/28/24 2009     (approximate)   History   No chief complaint on file.  Patient to ED via POV for pounding in her head. Denies pain states it sounds like someone is pounding in my head. Dc'd yesterday for a stroke. Denies new weakness or numbness. Speech clear. Reports she was taken off two blood pressure meds during admission due to hypotension. BP 181/81 in triage.  Pt discussed with Dicky, MD and given orders.    HPI Meghan Dougherty is a 83 y.o. female PMH hyponatremia, hypertension, prior stroke, hypothyroidism presents for evaluation of abnormal hearing/headache -Patient states she has been having an intermittent pounding sound in her right ear though denies any frank associated headache - Very brief, usually comes in clusters of 3, spontaneously resolves - No preceding trauma - No new strokelike symptoms - Does have some mild tinnitus as well.  Often takes an over-the-counter medication for this though skipped this yesterday, did take it today. - No baseline ear pain  Per chart review, recently admitted 01/26/2024-01/27/2024 for stroke after presenting with sudden onset dysarthria and dysphagia.  Code stroke activated, low NIHSS, no TNK given.  MRI with no obvious acute stroke, did show remote basal ganglia infarct.  Allergy recommended dual platelet therapy for 21 days then Plavix  monotherapy.     Physical Exam   Triage Vital Signs: ED Triage Vitals  Encounter Vitals Group     BP 01/28/24 1435 (!) 181/81     Girls Systolic BP Percentile --      Girls Diastolic BP Percentile --      Boys Systolic BP Percentile --      Boys Diastolic BP Percentile --      Pulse Rate 01/28/24 1435 81     Resp 01/28/24 1435 17     Temp 01/28/24 1435 98.7 F (37.1 C)     Temp Source 01/28/24 1435 Oral     SpO2 01/28/24 1435 100 %     Weight 01/28/24 1436 138 lb 14.2  oz (63 kg)     Height 01/28/24 1436 5' (1.524 m)     Head Circumference --      Peak Flow --      Pain Score 01/28/24 1436 0     Pain Loc --      Pain Education --      Exclude from Growth Chart --     Most recent vital signs: Vitals:   01/28/24 1435 01/28/24 2030  BP: (!) 181/81 (!) 176/75  Pulse: 81 79  Resp: 17 17  Temp: 98.7 F (37.1 C) 98.5 F (36.9 C)  SpO2: 100% 98%     General: Awake, no distress.  HEENT: Normocephalic, atraumatic, TM clear bilaterally, no TMJ tenderness, no lymphadenopathy appreciated CV:  Good peripheral perfusion. RRR, RP 2+ Resp:  Normal effort. CTAB Abd:  No distention. Nontender to deep palpation throughout Neuro:  Aox4, CN II-XII intact, FNF wnl, finger taps fast b/l, 5/5 strength in bilateral finger extension/grip, arm flexion/extension, EHL/FHL. BUE AG 10+ sec no drift, BLE AG 5+ sec no drift. Ambulates with steady gait. SILT. Negative Rhomberg.    ED Results / Procedures / Treatments   Labs (all labs ordered are listed, but only abnormal results are displayed) Labs Reviewed  COMPREHENSIVE METABOLIC PANEL WITH GFR - Abnormal; Notable for the following components:      Result  Value   Sodium 130 (*)    CO2 19 (*)    Glucose, Bld 107 (*)    All other components within normal limits  URINALYSIS, ROUTINE W REFLEX MICROSCOPIC - Abnormal; Notable for the following components:   Color, Urine YELLOW (*)    APPearance HAZY (*)    Ketones, ur 20 (*)    Leukocytes,Ua SMALL (*)    All other components within normal limits  CBC     EKG  Ecg = sinus rhythm, rate 78, no gross ST elevation or depression, no significant repolarization abnormality, normal axis, normal intervals.  No clear evidence of ischemia no arrhythmia on my interpretation.   RADIOLOGY Radiology interpreted by myself radiology report reviewed.  No acute pathology identified.    PROCEDURES:  Critical Care performed: No  Procedures   MEDICATIONS ORDERED IN  ED: Medications  cephALEXin (KEFLEX) capsule 500 mg (500 mg Oral Given 01/28/24 2154)     IMPRESSION / MDM / ASSESSMENT AND PLAN / ED COURSE  I reviewed the triage vital signs and the nursing notes.                              DDX/MDM/AP: Differential diagnosis includes, but is not limited to, unlikely ear infection, no evidence of current stroke, doubt intracranial hemorrhage.  Consider symptoms possibly secondary to hypertension--patient was somewhat hypotensive in patients they stopped her lisinopril  and metoprolol  with plan to follow-up with her PMD which is scheduled for this coming Monday.  Plan: -Labs - CT head - EKG - Reassess  Patient's presentation is most consistent with acute presentation with potential threat to life or bodily function.  The patient is on the cardiac monitor to evaluate for evidence of arrhythmia and/or significant heart rate changes.  ED course below.  Workup with hyponatremia within baseline range, do not suspect this is contributing.  Patient does have notable pyuria and says she has been having some pain with urination recently, will treat with Keflex open-remote history of penicillin allergy though cephalosporin checklist is reassuring, anticipate she should tolerate this okay.  No evidence of acute pathology at this time, unclear etiology of ongoing tinnitus/intermittent abnormal sounds in right ear though no concerning findings on exam today.  Will restart her lisinopril  and plan for PMD follow-up.  ED return precautions in place.  Patient and family agree with plan.  Clinical Course as of 01/28/24 2156  Tue Jan 28, 2024  2122 CMP with mild hyponatremia though within baseline range, bicarb mildly low  Urinalysis consistent with infection [MM]  2122 CBC reviewed, unremarkable [MM]    Clinical Course User Index [MM] Clarine Ozell LABOR, MD     FINAL CLINICAL IMPRESSION(S) / ED DIAGNOSES   Final diagnoses:  Tinnitus of right ear  Acute cystitis  without hematuria     Rx / DC Orders   ED Discharge Orders          Ordered    cephALEXin (KEFLEX) 500 MG capsule  2 times daily        01/28/24 2155             Note:  This document was prepared using Dragon voice recognition software and may include unintentional dictation errors.   Clarine Ozell LABOR, MD 01/28/24 2156
